# Patient Record
Sex: Female | Born: 1945 | Race: Black or African American | Hispanic: No | Marital: Married | Smoking: Former smoker
Health system: Southern US, Community
[De-identification: ages and names within clinical notes are randomized; demographics above are authoritative.]

## PROBLEM LIST (undated history)

## (undated) DIAGNOSIS — E119 Type 2 diabetes mellitus without complications: Secondary | ICD-10-CM

## (undated) DIAGNOSIS — R0789 Other chest pain: Secondary | ICD-10-CM

## (undated) DIAGNOSIS — J45909 Unspecified asthma, uncomplicated: Secondary | ICD-10-CM

## (undated) DIAGNOSIS — E7800 Pure hypercholesterolemia, unspecified: Secondary | ICD-10-CM

## (undated) DIAGNOSIS — R0602 Shortness of breath: Secondary | ICD-10-CM

## (undated) DIAGNOSIS — I10 Essential (primary) hypertension: Secondary | ICD-10-CM

## (undated) DIAGNOSIS — R000 Tachycardia, unspecified: Secondary | ICD-10-CM

## (undated) HISTORY — PX: ABDOMINAL HYSTERECTOMY: SHX81

---

## 1999-07-15 ENCOUNTER — Emergency Department (HOSPITAL_COMMUNITY): Admission: EM | Admit: 1999-07-15 | Discharge: 1999-07-15 | Payer: Self-pay | Admitting: Emergency Medicine

## 2006-07-29 ENCOUNTER — Encounter: Admission: RE | Admit: 2006-07-29 | Discharge: 2006-07-29 | Payer: Self-pay | Admitting: Internal Medicine

## 2007-05-26 ENCOUNTER — Encounter: Admission: RE | Admit: 2007-05-26 | Discharge: 2007-05-26 | Payer: Self-pay | Admitting: Internal Medicine

## 2007-07-04 ENCOUNTER — Ambulatory Visit: Payer: Self-pay | Admitting: *Deleted

## 2007-10-20 ENCOUNTER — Encounter: Admission: RE | Admit: 2007-10-20 | Discharge: 2007-10-20 | Payer: Self-pay | Admitting: Internal Medicine

## 2009-01-28 ENCOUNTER — Encounter: Admission: RE | Admit: 2009-01-28 | Discharge: 2009-01-28 | Payer: Self-pay | Admitting: Internal Medicine

## 2012-03-10 DIAGNOSIS — M109 Gout, unspecified: Secondary | ICD-10-CM | POA: Diagnosis not present

## 2012-04-18 DIAGNOSIS — E7800 Pure hypercholesterolemia, unspecified: Secondary | ICD-10-CM | POA: Diagnosis not present

## 2012-04-18 DIAGNOSIS — Z79899 Other long term (current) drug therapy: Secondary | ICD-10-CM | POA: Diagnosis not present

## 2012-04-18 DIAGNOSIS — IMO0001 Reserved for inherently not codable concepts without codable children: Secondary | ICD-10-CM | POA: Diagnosis not present

## 2012-04-18 DIAGNOSIS — M109 Gout, unspecified: Secondary | ICD-10-CM | POA: Diagnosis not present

## 2012-04-18 DIAGNOSIS — I10 Essential (primary) hypertension: Secondary | ICD-10-CM | POA: Diagnosis not present

## 2012-04-21 DIAGNOSIS — S058X9A Other injuries of unspecified eye and orbit, initial encounter: Secondary | ICD-10-CM | POA: Diagnosis not present

## 2012-06-28 DIAGNOSIS — IMO0001 Reserved for inherently not codable concepts without codable children: Secondary | ICD-10-CM | POA: Diagnosis not present

## 2012-06-28 DIAGNOSIS — E785 Hyperlipidemia, unspecified: Secondary | ICD-10-CM | POA: Diagnosis not present

## 2012-06-28 DIAGNOSIS — Z0000 Encounter for general adult medical examination without abnormal findings: Secondary | ICD-10-CM | POA: Diagnosis not present

## 2012-06-28 DIAGNOSIS — I10 Essential (primary) hypertension: Secondary | ICD-10-CM | POA: Diagnosis not present

## 2012-06-28 DIAGNOSIS — E559 Vitamin D deficiency, unspecified: Secondary | ICD-10-CM | POA: Diagnosis not present

## 2012-06-28 DIAGNOSIS — Z23 Encounter for immunization: Secondary | ICD-10-CM | POA: Diagnosis not present

## 2012-07-12 DIAGNOSIS — I10 Essential (primary) hypertension: Secondary | ICD-10-CM | POA: Diagnosis not present

## 2012-07-12 DIAGNOSIS — I739 Peripheral vascular disease, unspecified: Secondary | ICD-10-CM | POA: Diagnosis not present

## 2012-07-13 ENCOUNTER — Encounter (HOSPITAL_COMMUNITY): Payer: Self-pay | Admitting: Emergency Medicine

## 2012-07-13 ENCOUNTER — Emergency Department (HOSPITAL_COMMUNITY): Payer: Medicare Other

## 2012-07-13 ENCOUNTER — Emergency Department (HOSPITAL_COMMUNITY)
Admission: EM | Admit: 2012-07-13 | Discharge: 2012-07-13 | Disposition: A | Discharge status: Home / Self Care | Payer: Medicare Other | Attending: Emergency Medicine | Admitting: Emergency Medicine

## 2012-07-13 DIAGNOSIS — R05 Cough: Secondary | ICD-10-CM | POA: Insufficient documentation

## 2012-07-13 DIAGNOSIS — E1122 Type 2 diabetes mellitus with diabetic chronic kidney disease: Secondary | ICD-10-CM | POA: Diagnosis present

## 2012-07-13 DIAGNOSIS — Z79899 Other long term (current) drug therapy: Secondary | ICD-10-CM | POA: Insufficient documentation

## 2012-07-13 DIAGNOSIS — R079 Chest pain, unspecified: Secondary | ICD-10-CM | POA: Diagnosis not present

## 2012-07-13 DIAGNOSIS — N1831 Chronic kidney disease, stage 3a: Secondary | ICD-10-CM | POA: Diagnosis present

## 2012-07-13 DIAGNOSIS — R0789 Other chest pain: Secondary | ICD-10-CM | POA: Diagnosis not present

## 2012-07-13 DIAGNOSIS — R000 Tachycardia, unspecified: Secondary | ICD-10-CM | POA: Diagnosis present

## 2012-07-13 DIAGNOSIS — R002 Palpitations: Secondary | ICD-10-CM | POA: Insufficient documentation

## 2012-07-13 DIAGNOSIS — R059 Cough, unspecified: Secondary | ICD-10-CM | POA: Insufficient documentation

## 2012-07-13 DIAGNOSIS — I10 Essential (primary) hypertension: Secondary | ICD-10-CM | POA: Diagnosis present

## 2012-07-13 DIAGNOSIS — R0602 Shortness of breath: Secondary | ICD-10-CM | POA: Diagnosis not present

## 2012-07-13 DIAGNOSIS — E119 Type 2 diabetes mellitus without complications: Secondary | ICD-10-CM | POA: Diagnosis not present

## 2012-07-13 DIAGNOSIS — E7800 Pure hypercholesterolemia, unspecified: Secondary | ICD-10-CM | POA: Insufficient documentation

## 2012-07-13 DIAGNOSIS — Z7982 Long term (current) use of aspirin: Secondary | ICD-10-CM | POA: Diagnosis not present

## 2012-07-13 DIAGNOSIS — J45909 Unspecified asthma, uncomplicated: Secondary | ICD-10-CM | POA: Insufficient documentation

## 2012-07-13 DIAGNOSIS — E785 Hyperlipidemia, unspecified: Secondary | ICD-10-CM | POA: Diagnosis present

## 2012-07-13 HISTORY — DX: Unspecified asthma, uncomplicated: J45.909

## 2012-07-13 HISTORY — DX: Shortness of breath: R06.02

## 2012-07-13 HISTORY — DX: Other chest pain: R07.89

## 2012-07-13 HISTORY — DX: Tachycardia, unspecified: R00.0

## 2012-07-13 HISTORY — DX: Type 2 diabetes mellitus without complications: E11.9

## 2012-07-13 HISTORY — DX: Essential (primary) hypertension: I10

## 2012-07-13 HISTORY — DX: Pure hypercholesterolemia, unspecified: E78.00

## 2012-07-13 LAB — POCT I-STAT TROPONIN I: Troponin i, poc: 0.06 ng/mL (ref 0.00–0.08)

## 2012-07-13 LAB — PROTIME-INR: Prothrombin Time: 12.8 seconds (ref 11.6–15.2)

## 2012-07-13 LAB — HEPATIC FUNCTION PANEL
Albumin: 3.7 g/dL (ref 3.5–5.2)
Alkaline Phosphatase: 66 U/L (ref 39–117)
Indirect Bilirubin: 0.6 mg/dL (ref 0.3–0.9)
Total Protein: 6.6 g/dL (ref 6.0–8.3)

## 2012-07-13 LAB — CK TOTAL AND CKMB (NOT AT ARMC): Relative Index: INVALID (ref 0.0–2.5)

## 2012-07-13 LAB — POCT I-STAT, CHEM 8
HCT: 36 % (ref 36.0–46.0)
Hemoglobin: 12.2 g/dL (ref 12.0–15.0)
Potassium: 3.3 mEq/L — ABNORMAL LOW (ref 3.5–5.1)
Sodium: 144 mEq/L (ref 135–145)
TCO2: 24 mmol/L (ref 0–100)

## 2012-07-13 LAB — CBC WITH DIFFERENTIAL/PLATELET
Basophils Absolute: 0 10*3/uL (ref 0.0–0.1)
Basophils Relative: 1 % (ref 0–1)
Eosinophils Relative: 2 % (ref 0–5)
HCT: 37 % (ref 36.0–46.0)
Lymphocytes Relative: 27 % (ref 12–46)
MCHC: 33.2 g/dL (ref 30.0–36.0)
MCV: 87.1 fL (ref 78.0–100.0)
Monocytes Absolute: 0.3 10*3/uL (ref 0.1–1.0)
Neutro Abs: 3.6 10*3/uL (ref 1.7–7.7)
Platelets: 182 10*3/uL (ref 150–400)
RDW: 14.5 % (ref 11.5–15.5)
WBC: 5.5 10*3/uL (ref 4.0–10.5)

## 2012-07-13 MED ORDER — POTASSIUM CHLORIDE CRYS ER 20 MEQ PO TBCR
40.0000 meq | EXTENDED_RELEASE_TABLET | Freq: Once | ORAL | Status: AC
  Administered 2012-07-13: 40 meq via ORAL
  Filled 2012-07-13: qty 2

## 2012-07-13 MED ORDER — IOHEXOL 350 MG/ML SOLN
100.0000 mL | Freq: Once | INTRAVENOUS | Status: AC | PRN
  Administered 2012-07-13: 100 mL via INTRAVENOUS

## 2012-07-13 MED ORDER — ALPRAZOLAM 0.5 MG PO TABS
0.2500 mg | ORAL_TABLET | Freq: Once | ORAL | Status: DC
Start: 2012-07-13 — End: 2012-07-13
  Filled 2012-07-13: qty 1

## 2012-07-13 MED ORDER — GI COCKTAIL ~~LOC~~
30.0000 mL | Freq: Once | ORAL | Status: AC
  Administered 2012-07-13: 30 mL via ORAL
  Filled 2012-07-13: qty 30

## 2012-07-13 NOTE — ED Notes (Signed)
PT. REPORTS SOB AND MID CHEST DISCOMFORT WITH OCCASIONAL DRY COUGH ONSET THIS MORNING . STATES "  MY HANDS ARE SHAKING" , DENIES CHEST PAIN / NO NAUSEA OR DIAPHORESIS.

## 2012-07-13 NOTE — H&P (Signed)
Renee Bradley is an 66 y.o. female.    PCP:  Dr. Renae Gloss  Chief Complaint: SOB and chest pain  HPI: 77 YOF presents to ER today after having chest pressure this AM and SOB, no nausea, no diaphoresis.  She did complain of some heart fluttering.  Here in ER EKG with SR with Lt ant. Fascicular block, early transition.   No pain currently with my exam.  No SOB currently.  Troponin 0.06.    Pt had dopplers in Dr. Mathews Robinsons office yesterday, not sure if venous or arterial.   Here in ER D dimer elevated and pt underwent CT angio revealing no significant central pulmonary emboli are identified although the peripheral vessels are not well opacified and smaller peripheral emboli could be obscured.   Her K+ level is low at 3.3, replacing now.   Cardiac risk factors include:  Family history CAD, DM, HTN, hyperlipidemia.    Past Medical History  Diagnosis Date  . Asthma   . Hypertension   . Diabetes mellitus without complication   . Hypercholesterolemia   . Chest pressure 07/13/2012  . SOB (shortness of breath) 07/13/2012  . DM (diabetes mellitus) 07/13/2012  . HTN (hypertension) 07/13/2012  . Tachycardia, unspecified, "fluttering" 07/13/2012    History reviewed. No pertinent past surgical history.  Family History  Problem Relation Age of Onset  . Heart attack Mother   . Diabetes type II Mother   . Stroke Mother   . Alzheimer's disease Father   . Diabetes type II Brother    Social History:  reports that she has quit smoking. She does not have any smokeless tobacco history on file. She reports that she does not drink alcohol or use illicit drugs. Married, her husband is present with her.  Allergies:  Allergies  Allergen Reactions  . Sulfur Itching    OUTPATIENT MEDICATIONS: TekTurna 300 mg daily Aspirin 81 mg daily most days Atenolol 50 mg one twice a day Lipitor 20 mg daily Lasix 20 mg once daily  Saxagliptin-metformin 5-500 mg daily  Results for orders placed during the  hospital encounter of 07/13/12 (from the past 48 hour(s))  CBC WITH DIFFERENTIAL     Status: Normal   Collection Time   07/13/12  4:52 AM      Component Value Range Comment   WBC 5.5  4.0 - 10.5 K/uL    RBC 4.25  3.87 - 5.11 MIL/uL    Hemoglobin 12.3  12.0 - 15.0 g/dL    HCT 40.9  81.1 - 91.4 %    MCV 87.1  78.0 - 100.0 fL    MCH 28.9  26.0 - 34.0 pg    MCHC 33.2  30.0 - 36.0 g/dL    RDW 78.2  95.6 - 21.3 %    Platelets 182  150 - 400 K/uL    Neutrophils Relative 65  43 - 77 %    Neutro Abs 3.6  1.7 - 7.7 K/uL    Lymphocytes Relative 27  12 - 46 %    Lymphs Abs 1.5  0.7 - 4.0 K/uL    Monocytes Relative 6  3 - 12 %    Monocytes Absolute 0.3  0.1 - 1.0 K/uL    Eosinophils Relative 2  0 - 5 %    Eosinophils Absolute 0.1  0.0 - 0.7 K/uL    Basophils Relative 1  0 - 1 %    Basophils Absolute 0.0  0.0 - 0.1 K/uL   D-DIMER, QUANTITATIVE  Status: Abnormal   Collection Time   07/13/12  5:08 AM      Component Value Range Comment   D-Dimer, Quant 0.59 (*) 0.00 - 0.48 ug/mL-FEU   PROTIME-INR     Status: Normal   Collection Time   07/13/12  5:08 AM      Component Value Range Comment   Prothrombin Time 12.8  11.6 - 15.2 seconds    INR 0.97  0.00 - 1.49   POCT I-STAT TROPONIN I     Status: Normal   Collection Time   07/13/12  5:17 AM      Component Value Range Comment   Troponin i, poc 0.00  0.00 - 0.08 ng/mL    Comment 3            POCT I-STAT, CHEM 8     Status: Abnormal   Collection Time   07/13/12  5:19 AM      Component Value Range Comment   Sodium 144  135 - 145 mEq/L    Potassium 3.3 (*) 3.5 - 5.1 mEq/L    Chloride 108  96 - 112 mEq/L    BUN 12  6 - 23 mg/dL    Creatinine, Ser 4.09  0.50 - 1.10 mg/dL    Glucose, Bld 95  70 - 99 mg/dL    Calcium, Ion 8.11  9.14 - 1.30 mmol/L    TCO2 24  0 - 100 mmol/L    Hemoglobin 12.2  12.0 - 15.0 g/dL    HCT 78.2  95.6 - 21.3 %   POCT I-STAT TROPONIN I     Status: Normal   Collection Time   07/13/12  7:05 AM      Component Value  Range Comment   Troponin i, poc 0.06  0.00 - 0.08 ng/mL    Comment 3            HEPATIC FUNCTION PANEL     Status: Normal   Collection Time   07/13/12  9:29 AM      Component Value Range Comment   Total Protein 6.6  6.0 - 8.3 g/dL    Albumin 3.7  3.5 - 5.2 g/dL    AST 14  0 - 37 U/L    ALT 8  0 - 35 U/L    Alkaline Phosphatase 66  39 - 117 U/L    Total Bilirubin 0.7  0.3 - 1.2 mg/dL    Bilirubin, Direct 0.1  0.0 - 0.3 mg/dL    Indirect Bilirubin 0.6  0.3 - 0.9 mg/dL   MAGNESIUM     Status: Normal   Collection Time   07/13/12  9:29 AM      Component Value Range Comment   Magnesium 2.2  1.5 - 2.5 mg/dL   CK TOTAL AND CKMB     Status: Normal   Collection Time   07/13/12  9:34 AM      Component Value Range Comment   Total CK 83  7 - 177 U/L    CK, MB 2.6  0.3 - 4.0 ng/mL    Relative Index RELATIVE INDEX IS INVALID  0.0 - 2.5    Dg Chest 2 View  07/13/2012  *RADIOLOGY REPORT*  Clinical Data: Shortness of breath.  CHEST - 2 VIEW  Comparison: None.  Findings: Shallow inspiration.  Mild cardiac enlargement with normal pulmonary vascularity.  No focal airspace consolidation in the lungs.  No blunting of costophrenic angles.  No pneumothorax. Mediastinal contours appear intact.  Degenerative changes in the thoracic spine.  IMPRESSION: Cardiac enlargement.  No evidence of active pulmonary disease.   Original Report Authenticated By: Burman Nieves, M.D.    Ct Angio Chest Pe W/cm &/or Wo Cm  07/13/2012  *RADIOLOGY REPORT*  Clinical Data: Shortness of breath and chest discomfort. Occasional cough.  Elevated D-dimer.  CT ANGIOGRAPHY CHEST  Technique:  Multidetector CT imaging of the chest using the standard protocol during bolus administration of intravenous contrast. Multiplanar reconstructed images including MIPs were obtained and reviewed to evaluate the vascular anatomy.  Contrast: OMNIPAQUE IOHEXOL 350 MG/ML SOLN  Comparison: None.  Findings: There is moderately good opacification of  the central and proximal segmental pulmonary arteries.  More peripheral segmental branches are incompletely opacified.  No filling defects are demonstrated.  No evidence of significant central pulmonary embolus although peripheral emboli could be obscured. Mild cardiac enlargement.  Reflux of contrast material into the hepatic veins suggesting passive congestion.  Normal caliber thoracic aorta. Esophagus is decompressed.  No significant lymphadenopathy in the chest.  Visualization of the lung parenchyma is limited due to respiratory motion artifact but no focal consolidation is appreciated.  Mild dependent changes in the posterior lungs.  Airways appear patent. No pneumothorax.  No pleural effusions.  The degenerative changes in the thoracic spine.  IMPRESSION: No significant central pulmonary emboli are identified although the peripheral vessels are not well opacified and smaller peripheral emboli could be obscured.   Original Report Authenticated By: Burman Nieves, M.D.     ROS: General:no colds or fevers, no weight changes Skin:no rashes or ulcers HEENT:no blurred vision, no congestion CV:see HPI PUL:see HPI GI:no diarrhea constipation or melena, no indigestion GU:no hematuria, no dysuria MS:no joint pain, no claudication Neuro:no syncope, no lightheadedness Endo:+ diabetes, no thyroid disease   Blood pressure 114/57, pulse 55, temperature 99 F (37.2 C), temperature source Oral, resp. rate 14, SpO2 96.00%. PE: General: Alert oriented and female in no acute distress pleasant affect Skin: Warm and dry, brisk capillary refill HEENT: Normal cephalic, sclera clear, glasses in place Neck: Supple no JVD no carotid bruits Heart: S1-S2 regular rate and rhythm no murmur gallop rub or click Lungs: Clear without rales rhonchi or wheezes Abd: Positive bowel sounds soft nontender do not palpate liver spleen or masses Ext: No edema, 2+ pedal on the left questionable pedal on the right, positive  varicosities Neuro: Alert and oriented x3 follows commands moves all extremities    Assessment/Plan Principal Problem:  *Chest pressure Active Problems:  SOB (shortness of breath)  DM (diabetes mellitus)  HTN (hypertension)  Hyperlipidemia  Tachycardia, unspecified, "fluttering"  PLAN: Negative CT angio, though small vessel PE could not be addressed.  ? Admit to obs and do nuc study here?  She has multiple risk factors for CAD.  VS. D/c and do as outpt.   MD to see to evaluate. Second troponin is more elevated than 1st but still WNL.  CKMB Pend.    Addendum:  ckmb negative.  No further complaints -pt has been seen and evaluated by Dr. Allyson Sabal. We will arrange outpt Lexiscan myoview, echo and 30 day auto event monitor for bradycardia here in ER and "fluttering" at home.  INGOLD,LAURA R 07/13/2012, 11:55 AM    .Agree with note written by Nada Boozer RNP Runell Gess 07/15/2012 6:03 PM

## 2012-07-13 NOTE — ED Provider Notes (Signed)
History     CSN: [id number]  Arrival date & time 07/13/12  0409   First MD Initiated Contact with Patient 07/13/12 0422      Chief Complaint  Patient presents with  . Shortness of Breath    (Consider location/radiation/quality/duration/timing/severity/associated sxs/prior treatment) Patient is a 66 y.o. female presenting with chest pain. The history is provided by the patient. No language interpreter was used.  Chest Pain The chest pain began 3 - 5 hours ago. Chest pain occurs constantly. The chest pain is unchanged. The pain is associated with coughing. The severity of the pain is moderate. The quality of the pain is described as dull. The pain does not radiate. Exacerbated by: nothing. Primary symptoms include cough and palpitations. Pertinent negatives for primary symptoms include no fever, no fatigue, no syncope, no shortness of breath, no wheezing, no abdominal pain, no nausea and no vomiting.  The palpitations did not occur with shortness of breath.   Pertinent negatives for associated symptoms include no claudication and no lower extremity edema. She tried nothing for the symptoms. Risk factors include being elderly and female gender.  Pertinent negatives for past medical history include no MI.  Procedure history is negative for cardiac catheterization.   Awoke and had chest discomfort with a lot of belching and her hands were shaking and had sense she could not catch her breath.  No n/v/d.  Has had a dry cough for several days.  No weakness nor numbness. No changes in vision or speech.    Past Medical History  Diagnosis Date  . Asthma   . Hypertension   . Diabetes mellitus without complication   . Hypercholesterolemia     History reviewed. No pertinent past surgical history.  No family history on file.  History  Substance Use Topics  . Smoking status: Former Games developer  . Smokeless tobacco: Not on file  . Alcohol Use: No    OB History    Grav Para Term Preterm  Abortions TAB SAB Ect Mult Living                  Review of Systems  Constitutional: Negative for fever and fatigue.  Respiratory: Positive for cough. Negative for shortness of breath and wheezing.   Cardiovascular: Positive for chest pain and palpitations. Negative for claudication, leg swelling and syncope.  Gastrointestinal: Negative for nausea, vomiting and abdominal pain.  All other systems reviewed and are negative.    Allergies  Sulfur  Home Medications   Current Outpatient Rx  Name  Route  Sig  Dispense  Refill  . ALISKIREN FUMARATE 300 MG PO TABS   Oral   Take 300 mg by mouth daily.         . ASPIRIN 81 MG PO CHEW   Oral   Chew 81 mg by mouth daily.         . ATENOLOL 50 MG PO TABS   Oral   Take 100 mg by mouth daily.         . ATORVASTATIN CALCIUM 20 MG PO TABS   Oral   Take 20 mg by mouth daily.         . FUROSEMIDE 20 MG PO TABS   Oral   Take 20 mg by mouth 2 (two) times daily.         Marland Kitchen SAXAGLIPTIN-METFORMIN ER 5-500 MG PO TB24   Oral   Take 1 tablet by mouth daily.  BP 171/75  Pulse 70  Temp 99 F (37.2 C) (Oral)  Resp 14  SpO2 99%  Physical Exam  Constitutional: She is oriented to person, place, and time. She appears well-developed and well-nourished. No distress.  HENT:  Head: Normocephalic and atraumatic.  Mouth/Throat: Oropharynx is clear and moist.  Eyes: EOM are normal. Pupils are equal, round, and reactive to light.  Neck: Normal range of motion. Neck supple.  Cardiovascular: Normal rate, regular rhythm and intact distal pulses.   Pulmonary/Chest: Effort normal. She has no wheezes. She has no rales.  Abdominal: Soft. Bowel sounds are normal. There is no tenderness. There is no rebound and no guarding.  Musculoskeletal: Normal range of motion. She exhibits no edema and no tenderness.  Neurological: She is alert and oriented to person, place, and time. She has normal reflexes. She exhibits normal muscle tone.   Skin: Skin is warm and dry. She is not diaphoretic.  Psychiatric: She has a normal mood and affect.    ED Course  Procedures (including critical care time)   Labs Reviewed  CBC WITH DIFFERENTIAL  D-DIMER, QUANTITATIVE   No results found.   No diagnosis found.    MDM   Date: 07/13/2012  Rate: 65  Rhythm: normal sinus rhythm  QRS Axis: normal  Intervals: normal  ST/T Wave abnormalities: normal  Conduction Disutrbances: none  Narrative Interpretation:early transition   Pain not consistent with PE. Due to change in troponin will have Dr. Allyson Sabal of [facility] and his team evaluate the patient.        Jasmine Awe, MD 07/13/12 [phone]

## 2012-07-14 ENCOUNTER — Other Ambulatory Visit (HOSPITAL_COMMUNITY): Payer: Self-pay | Admitting: Cardiology

## 2012-07-14 DIAGNOSIS — R001 Bradycardia, unspecified: Secondary | ICD-10-CM

## 2012-07-14 DIAGNOSIS — I498 Other specified cardiac arrhythmias: Secondary | ICD-10-CM

## 2012-07-14 DIAGNOSIS — R079 Chest pain, unspecified: Secondary | ICD-10-CM

## 2012-07-29 ENCOUNTER — Ambulatory Visit (HOSPITAL_COMMUNITY): Payer: Medicare Other

## 2012-07-29 ENCOUNTER — Inpatient Hospital Stay (HOSPITAL_COMMUNITY): Admission: RE | Admit: 2012-07-29 | Payer: Medicare Other | Source: Ambulatory Visit

## 2012-07-29 DIAGNOSIS — R002 Palpitations: Secondary | ICD-10-CM | POA: Diagnosis not present

## 2012-08-10 ENCOUNTER — Ambulatory Visit (HOSPITAL_COMMUNITY)
Admission: RE | Admit: 2012-08-10 | Discharge: 2012-08-10 | Disposition: A | Discharge status: Home / Self Care | Payer: Medicare Other | Source: Ambulatory Visit | Attending: Cardiology | Admitting: Cardiology

## 2012-08-10 DIAGNOSIS — R079 Chest pain, unspecified: Secondary | ICD-10-CM

## 2012-08-10 DIAGNOSIS — R072 Precordial pain: Secondary | ICD-10-CM | POA: Insufficient documentation

## 2012-08-10 DIAGNOSIS — I10 Essential (primary) hypertension: Secondary | ICD-10-CM | POA: Insufficient documentation

## 2012-08-10 DIAGNOSIS — R0789 Other chest pain: Secondary | ICD-10-CM | POA: Diagnosis not present

## 2012-08-10 DIAGNOSIS — R001 Bradycardia, unspecified: Secondary | ICD-10-CM

## 2012-08-10 DIAGNOSIS — E119 Type 2 diabetes mellitus without complications: Secondary | ICD-10-CM | POA: Insufficient documentation

## 2012-08-10 DIAGNOSIS — R0602 Shortness of breath: Secondary | ICD-10-CM | POA: Diagnosis not present

## 2012-08-10 DIAGNOSIS — I495 Sick sinus syndrome: Secondary | ICD-10-CM | POA: Insufficient documentation

## 2012-08-10 DIAGNOSIS — I498 Other specified cardiac arrhythmias: Secondary | ICD-10-CM

## 2012-08-10 DIAGNOSIS — I4892 Unspecified atrial flutter: Secondary | ICD-10-CM | POA: Insufficient documentation

## 2012-08-10 DIAGNOSIS — I059 Rheumatic mitral valve disease, unspecified: Secondary | ICD-10-CM | POA: Insufficient documentation

## 2012-08-10 DIAGNOSIS — I369 Nonrheumatic tricuspid valve disorder, unspecified: Secondary | ICD-10-CM | POA: Diagnosis not present

## 2012-08-10 MED ORDER — REGADENOSON 0.4 MG/5ML IV SOLN
0.4000 mg | Freq: Once | INTRAVENOUS | Status: AC
  Administered 2012-08-10: 0.4 mg via INTRAVENOUS

## 2012-08-10 MED ORDER — TECHNETIUM TC 99M SESTAMIBI GENERIC - CARDIOLITE
30.5000 | Freq: Once | INTRAVENOUS | Status: AC | PRN
  Administered 2012-08-10: 31 via INTRAVENOUS

## 2012-08-10 MED ORDER — TECHNETIUM TC 99M SESTAMIBI GENERIC - CARDIOLITE
10.4000 | Freq: Once | INTRAVENOUS | Status: AC | PRN
  Administered 2012-08-10: 10 via INTRAVENOUS

## 2012-08-10 NOTE — Progress Notes (Signed)
[facility]   2D echo completed 08/10/2012.   Cindy Shelly Spenser, RDCS   

## 2012-08-10 NOTE — Procedures (Addendum)
[facility] [address] [place] [address] [place] [address] [phone]  Cardiology Nuclear Med Study  Renee Bradley is a 67 y.o. female     MRN : [id number]     DOB: 19-Oct-1945  Procedure Date: 08/10/2012  Nuclear Med Background Indication for Stress Test:  Evaluation for Ischemia History:  no prior cardiac history Cardiac Risk Factors: Family History - CAD, Hypertension, Lipids, NIDDM and Obesity  Symptoms:  SOB and chest pressure, flutter   Nuclear Pre-Procedure Caffeine/Decaff Intake:  7:00pm NPO After: 5:00am   IV Site: R Antecubital  IV 0.9% NS with Angio Cath:  22g  Chest Size (in):  n/a IV Started by: Koren Shiver, CNMT  Height: 5\' 3"  (1.6 m)  Cup Size: D  BMI:  Body mass index is 35.07 kg/(m^2). Weight:  198 lb (89.812 kg)   Tech Comments:  n/a    Nuclear Med Study 1 or 2 day study: 1 day  Stress Test Type:  Lexiscan  Order Authorizing Provider:  Nanetta Batty, MD   Resting Radionuclide: Technetium 63m Sestamibi  Resting Radionuclide Dose: 10.4 mCi   Stress Radionuclide:  Technetium 7m Sestamibi  Stress Radionuclide Dose: 30.5 mCi           Stress Protocol Rest HR: 44 Stress HR: 78  Rest BP:136/79 Stress BP: 157/71  Exercise Time (min): n/a METS: n/a          Dose of Adenosine (mg):  n/a Dose of Lexiscan: 0.4 mg  Dose of Atropine (mg): n/a Dose of Dobutamine: n/a mcg/kg/min (at max HR)  Stress Test Technologist: Ernestene Mention, CCT Nuclear Technologist: Gonzella Lex, CNMT   Rest Procedure:  Myocardial perfusion imaging was performed at rest 45 minutes following the intravenous administration of Technetium 23m Sestamibi. Stress Procedure:  The patient received IV Lexiscan 0.4 mg over 15-seconds.  Technetium 2m Sestamibi injected at 30-seconds.  There were no significant changes with Lexiscan.  Quantitative spect images were obtained after a 45 minute delay.  Transient Ischemic Dilatation (Normal <1.22):   0.91 Lung/Heart Ratio (Normal <0.45):  0.36 QGS EDV: 91 ml QGS ESV:  36 ml LV Ejection Fraction: 60%  Signed by       Rest ECG: NSR with non-specific ST-T wave changes  Stress ECG: No significant change from baseline ECG  QPS Raw Data Images:  Normal; no motion artifact; normal heart/lung ratio. Stress Images:  There is decreased uptake inferolaterally towards the apex Rest Images:  Normal homogeneous uptake in all areas of the myocardium. Subtraction (SDS):  These findings are consistent with ischemia.  Impression Exercise Capacity:  Lexiscan with no exercise. BP Response:  Normal blood pressure response. Clinical Symptoms:  No significant symptoms noted. ECG Impression:  No significant ST segment change suggestive of ischemia. Comparison with Prior Nuclear Study: No images to compare  Overall Impression:  Intermediate stress nuclear study.  LV Wall Motion:  Normal Wall Motion   Runell Gess, MD  08/10/2012 12:32 PM

## 2012-09-05 DIAGNOSIS — E782 Mixed hyperlipidemia: Secondary | ICD-10-CM | POA: Diagnosis not present

## 2012-09-05 DIAGNOSIS — E119 Type 2 diabetes mellitus without complications: Secondary | ICD-10-CM | POA: Diagnosis not present

## 2012-09-05 DIAGNOSIS — I10 Essential (primary) hypertension: Secondary | ICD-10-CM | POA: Diagnosis not present

## 2012-10-21 DIAGNOSIS — I10 Essential (primary) hypertension: Secondary | ICD-10-CM | POA: Diagnosis not present

## 2012-10-21 DIAGNOSIS — E559 Vitamin D deficiency, unspecified: Secondary | ICD-10-CM | POA: Diagnosis not present

## 2012-12-29 DIAGNOSIS — Z79899 Other long term (current) drug therapy: Secondary | ICD-10-CM | POA: Diagnosis not present

## 2012-12-29 DIAGNOSIS — E119 Type 2 diabetes mellitus without complications: Secondary | ICD-10-CM | POA: Diagnosis not present

## 2012-12-29 DIAGNOSIS — I10 Essential (primary) hypertension: Secondary | ICD-10-CM | POA: Diagnosis not present

## 2012-12-29 DIAGNOSIS — E785 Hyperlipidemia, unspecified: Secondary | ICD-10-CM | POA: Diagnosis not present

## 2013-01-05 DIAGNOSIS — Z1211 Encounter for screening for malignant neoplasm of colon: Secondary | ICD-10-CM | POA: Diagnosis not present

## 2013-01-05 DIAGNOSIS — Z8371 Family history of colonic polyps: Secondary | ICD-10-CM | POA: Diagnosis not present

## 2013-01-05 DIAGNOSIS — R142 Eructation: Secondary | ICD-10-CM | POA: Diagnosis not present

## 2013-01-05 DIAGNOSIS — R143 Flatulence: Secondary | ICD-10-CM | POA: Diagnosis not present

## 2013-02-06 DIAGNOSIS — M722 Plantar fascial fibromatosis: Secondary | ICD-10-CM | POA: Diagnosis not present

## 2013-03-20 DIAGNOSIS — M7660 Achilles tendinitis, unspecified leg: Secondary | ICD-10-CM | POA: Diagnosis not present

## 2013-03-20 DIAGNOSIS — M722 Plantar fascial fibromatosis: Secondary | ICD-10-CM | POA: Diagnosis not present

## 2013-03-28 ENCOUNTER — Encounter (HOSPITAL_COMMUNITY): Payer: Self-pay | Admitting: *Deleted

## 2013-03-28 ENCOUNTER — Emergency Department (INDEPENDENT_AMBULATORY_CARE_PROVIDER_SITE_OTHER)
Admission: EM | Admit: 2013-03-28 | Discharge: 2013-03-28 | Disposition: A | Discharge status: Home / Self Care | Payer: Medicare Other | Source: Home / Self Care

## 2013-03-28 DIAGNOSIS — K219 Gastro-esophageal reflux disease without esophagitis: Secondary | ICD-10-CM | POA: Diagnosis not present

## 2013-03-28 MED ORDER — OMEPRAZOLE 40 MG PO CPDR: 40.0000 mg | DELAYED_RELEASE_CAPSULE | Freq: Every day | ORAL | Status: DC

## 2013-03-28 MED ORDER — GI COCKTAIL ~~LOC~~
ORAL | Status: AC
  Filled 2013-03-28: qty 30

## 2013-03-28 MED ORDER — GI COCKTAIL ~~LOC~~
30.0000 mL | Freq: Once | ORAL | Status: AC
  Administered 2013-03-28: 30 mL via ORAL

## 2013-03-28 NOTE — ED Notes (Signed)
Pt  Reports  Symptoms  Of  Epigastric  Burning         Last    Night  And  Early  Pm            She  Vomited      And       She  Felt  Better       She  Reports   Some  Diarrhea  As  Well      At  This  Time  Pt   Is  Sitting  Upright on  Exam table  Speaking in  Complete  sentances  And  Is  In no  Distress

## 2013-03-28 NOTE — ED Provider Notes (Signed)
Renee Bradley is a 67 y.o. female who presents to Urgent Care today for abdominal pain, vomiting and diarrhea. These symptoms started last night. She noted a burning sensation in her throat. He developed a few episodes of vomiting and diarrhea with it. She denies any blood in her vomit or diarrhea. She ate a salad prior to the symptoms starting. She has not changed her medications. The symptoms have resolved by this morning with the exception of the heartburn. She denies any trouble breathing palpitations or chest pain.  No fevers or chills.    PMH reviewed. Diabetes and hypertension  History  Substance Use Topics  . Smoking status: Former Games developer  . Smokeless tobacco: Not on file  . Alcohol Use: No   ROS as above Medications reviewed. Current Facility-Administered Medications  Medication Dose Route Frequency Provider Last Rate Last Dose  . gi cocktail (Maalox,Lidocaine,Donnatal)  30 mL Oral Once Rodolph Bong, MD       Current Outpatient Prescriptions  Medication Sig Dispense Refill  . aliskiren (TEKTURNA) 300 MG tablet Take 300 mg by mouth daily.      Marland Kitchen aspirin 81 MG chewable tablet Chew 81 mg by mouth daily.      Marland Kitchen atenolol (TENORMIN) 50 MG tablet Take 100 mg by mouth daily.      Marland Kitchen atorvastatin (LIPITOR) 20 MG tablet Take 20 mg by mouth daily.      . furosemide (LASIX) 20 MG tablet Take 20 mg by mouth 2 (two) times daily.      . Saxagliptin-Metformin (KOMBIGLYZE XR) 5-500 MG TB24 Take 1 tablet by mouth daily.        Exam:  BP 160/79  Pulse 55  Temp(Src) 98.3 F (36.8 C) (Oral)  Resp 20  SpO2 98% Gen: Well NAD HEENT: EOMI,  MMM Lungs: CTABL Nl WOB Heart: RRR no MRG Abd: NABS, NT, ND Exts: Non edematous BL  LE, warm and well perfused.   Patient had significant improvement of her symptoms following administration of the GI cocktail  No results found for this or any previous visit (from the past 24 hour(s)). No results found.  Assessment and Plan: 67 y.o. female with GERD  versus gastritis. Patient had significant improvement with GI cocktail. Plan to prescribe omeprazole and have patient followup with her primary care provider in a few weeks. Handout provided Discussed warning signs or symptoms. Please see discharge instructions. Patient expresses understanding.      Rodolph Bong, MD 03/28/13 1146

## 2013-04-12 ENCOUNTER — Ambulatory Visit (INDEPENDENT_AMBULATORY_CARE_PROVIDER_SITE_OTHER): Payer: Medicare Other | Admitting: Cardiovascular Disease

## 2013-04-12 ENCOUNTER — Encounter: Payer: Self-pay | Admitting: Cardiovascular Disease

## 2013-04-12 VITALS — BP 112/82 | HR 46 | Ht 63.0 in | Wt 194.0 lb

## 2013-04-12 DIAGNOSIS — E785 Hyperlipidemia, unspecified: Secondary | ICD-10-CM

## 2013-04-12 DIAGNOSIS — I10 Essential (primary) hypertension: Secondary | ICD-10-CM | POA: Diagnosis not present

## 2013-04-12 NOTE — Assessment & Plan Note (Signed)
Under good control on current medications 

## 2013-04-12 NOTE — Progress Notes (Signed)
04/12/2013 Renee Bradley   Jun 30, 1946  [id number]  Primary Physician Alva Garnet., MD Primary Cardiologist: Runell Gess MD Renee Bradley   HPI:  The patient is a 67 year old moderately overweight married [place] American female, mother of 2, grandmother of 2 grandchildren, who is accompanied by her daughter today. She was recently hospitalized, July 15, 2012, with shortness of breath. She had had lower extremity Dopplers performed at Dr. Mathews Robinsons office just prior to that. CT angiogram was negative. She was ultimately discharged home. Her risk factors for ischemic heart disease include remote tobacco abuse, having quit 35 years ago, and treated diabetes, hypertension, and hyperlipidemia. There is no family history of heart disease. She has never had a heart attack or stroke. Since her admission, she denies chest pain or shortness of breath. Her surgical history is remarkable for a remote hysterectomy in 1989. She wore an event monitor, which showed sinus rhythm, and a Lexiscan Myoview showed mild inferolateral-apical ischemia.  Since I saw her 6 months ago she has been completely asymptomatic.     Current Outpatient Prescriptions  Medication Sig Dispense Refill  . aliskiren (TEKTURNA) 300 MG tablet Take 300 mg by mouth daily.      Marland Kitchen aspirin 81 MG chewable tablet Chew 81 mg by mouth daily.      Marland Kitchen atenolol (TENORMIN) 50 MG tablet Take 100 mg by mouth daily.      Marland Kitchen atorvastatin (LIPITOR) 20 MG tablet Take 20 mg by mouth daily.      . furosemide (LASIX) 20 MG tablet Take 20 mg by mouth daily.       . Saxagliptin-Metformin (KOMBIGLYZE XR) 5-500 MG TB24 Take 1 tablet by mouth daily.       No current facility-administered medications for this visit.    Allergies  Allergen Reactions  . Sulfur Itching    History   Social History  . Marital Status: Married    Spouse Name: N/A    Number of Children: N/A  . Years of Education: N/A   Occupational History  .  Not on file.   Social History Main Topics  . Smoking status: Former Games developer  . Smokeless tobacco: Not on file  . Alcohol Use: No  . Drug Use: No  . Sexual Activity:    Other Topics Concern  . Not on file   Social History Narrative  . No narrative on file     Review of Systems: General: negative for chills, fever, night sweats or weight changes.  Cardiovascular: negative for chest pain, dyspnea on exertion, edema, orthopnea, palpitations, paroxysmal nocturnal dyspnea or shortness of breath Dermatological: negative for rash Respiratory: negative for cough or wheezing Urologic: negative for hematuria Abdominal: negative for nausea, vomiting, diarrhea, bright red blood per rectum, melena, or hematemesis Neurologic: negative for visual changes, syncope, or dizziness All other systems reviewed and are otherwise negative except as noted above.    Blood pressure 112/82, pulse 46, height 5\' 3"  (1.6 m), weight 194 lb (87.998 kg).  General appearance: alert and no distress Neck: no adenopathy, no carotid bruit, no JVD, supple, symmetrical, trachea midline and thyroid not enlarged, symmetric, no tenderness/mass/nodules Lungs: clear to auscultation bilaterally Heart: regular rate and rhythm, S1, S2 normal, no murmur, click, rub or gallop Extremities: extremities normal, atraumatic, no cyanosis or edema  EKG sinus bradycardia at 46 with nonspecific ST and T-wave changes  ASSESSMENT AND PLAN:   HTN (hypertension) Under good control on current medications  Hyperlipidemia On statin therapy followed by  her PCP      Runell Gess MD FACP,FACC,FAHA, [facility] 04/12/2013 5:21 PM

## 2013-04-12 NOTE — Patient Instructions (Addendum)
Your physician wants you to follow-up in: 1 year with Dr Berry. You will receive a reminder letter in the mail two months in advance. If you don't receive a letter, please call our office to schedule the follow-up appointment.  

## 2013-04-12 NOTE — Assessment & Plan Note (Signed)
On statin therapy followed by her PCP 

## 2013-04-21 DIAGNOSIS — IMO0002 Reserved for concepts with insufficient information to code with codable children: Secondary | ICD-10-CM | POA: Diagnosis not present

## 2013-04-21 DIAGNOSIS — M545 Low back pain, unspecified: Secondary | ICD-10-CM | POA: Diagnosis not present

## 2013-04-21 DIAGNOSIS — M169 Osteoarthritis of hip, unspecified: Secondary | ICD-10-CM | POA: Diagnosis not present

## 2013-05-12 ENCOUNTER — Encounter: Payer: Self-pay | Admitting: Cardiovascular Disease

## 2013-05-12 ENCOUNTER — Encounter: Payer: Medicare Other | Admitting: Cardiovascular Disease

## 2013-05-12 VITALS — BP 130/70 | HR 64 | Ht 63.0 in | Wt 198.6 lb

## 2013-05-15 NOTE — Assessment & Plan Note (Signed)
Well-controlled on current medications 

## 2013-05-15 NOTE — Assessment & Plan Note (Signed)
On statin therapy followed by her PCP 

## 2013-05-16 DIAGNOSIS — M25569 Pain in unspecified knee: Secondary | ICD-10-CM | POA: Diagnosis not present

## 2013-07-03 ENCOUNTER — Encounter: Payer: Self-pay | Admitting: Cardiovascular Disease

## 2013-07-03 NOTE — Progress Notes (Signed)
This encounter was created in error - please disregard.

## 2013-07-11 DIAGNOSIS — E785 Hyperlipidemia, unspecified: Secondary | ICD-10-CM | POA: Diagnosis not present

## 2013-07-11 DIAGNOSIS — E119 Type 2 diabetes mellitus without complications: Secondary | ICD-10-CM | POA: Diagnosis not present

## 2013-07-11 DIAGNOSIS — Z23 Encounter for immunization: Secondary | ICD-10-CM | POA: Diagnosis not present

## 2013-07-11 DIAGNOSIS — Z79899 Other long term (current) drug therapy: Secondary | ICD-10-CM | POA: Diagnosis not present

## 2013-07-11 DIAGNOSIS — Z0000 Encounter for general adult medical examination without abnormal findings: Secondary | ICD-10-CM | POA: Diagnosis not present

## 2013-07-11 DIAGNOSIS — E559 Vitamin D deficiency, unspecified: Secondary | ICD-10-CM | POA: Diagnosis not present

## 2013-07-11 DIAGNOSIS — I10 Essential (primary) hypertension: Secondary | ICD-10-CM | POA: Diagnosis not present

## 2013-09-14 ENCOUNTER — Other Ambulatory Visit: Payer: Self-pay

## 2013-09-14 DIAGNOSIS — Z1231 Encounter for screening mammogram for malignant neoplasm of breast: Secondary | ICD-10-CM

## 2013-10-12 ENCOUNTER — Ambulatory Visit: Payer: Medicare Other

## 2013-11-02 DIAGNOSIS — E785 Hyperlipidemia, unspecified: Secondary | ICD-10-CM | POA: Diagnosis not present

## 2013-11-02 DIAGNOSIS — E119 Type 2 diabetes mellitus without complications: Secondary | ICD-10-CM | POA: Diagnosis not present

## 2013-11-02 DIAGNOSIS — I10 Essential (primary) hypertension: Secondary | ICD-10-CM | POA: Diagnosis not present

## 2013-11-02 DIAGNOSIS — I808 Phlebitis and thrombophlebitis of other sites: Secondary | ICD-10-CM | POA: Diagnosis not present

## 2013-12-01 ENCOUNTER — Telehealth: Payer: Self-pay | Admitting: Cardiovascular Disease

## 2013-12-01 NOTE — Telephone Encounter (Signed)
Have some questions about a huge vein on her left breast that extends all around , and is noticing that with some of her medications that it will get bigger and some medications it will be in a relaxed state . Also has one on her (L) thigh and is very concern about them. Getting ready to leave and would like a call one day next week jplease .Marland Kitchen   Thanks

## 2013-12-01 NOTE — Telephone Encounter (Signed)
Returned call.  Left message on "[facility]" voicemail that call returned and message received.  Will need an appt for evaluation.  Call call back today before 4pm or Monday between 8am and 4pm.

## 2013-12-05 ENCOUNTER — Telehealth: Payer: Self-pay | Admitting: Cardiovascular Disease

## 2013-12-05 NOTE — Telephone Encounter (Signed)
Returning your call .. Please call  ° °Thanks  °

## 2013-12-05 NOTE — Telephone Encounter (Signed)
Returned call and pt verified x 2.  Pt stated she noticed one of her medications aggravates a broken vein on her L breast.  Stated the medicine is metformin and she did see her PCP.  Stated she told her it was phlebitis.    Pt advised to f/u with PCP as she may need a referral to a [facility] for evaluation.  Pt stated she thought she could receive that care in our office and RN explained that would be be another type of specialist.  Pt verbalized understanding and agreed w/ plan.  Contact info given for [facility] [place].

## 2014-02-07 DIAGNOSIS — E1165 Type 2 diabetes mellitus with hyperglycemia: Secondary | ICD-10-CM | POA: Diagnosis not present

## 2014-02-07 DIAGNOSIS — N182 Chronic kidney disease, stage 2 (mild): Secondary | ICD-10-CM | POA: Diagnosis not present

## 2014-02-07 DIAGNOSIS — E1129 Type 2 diabetes mellitus with other diabetic kidney complication: Secondary | ICD-10-CM | POA: Diagnosis not present

## 2014-02-07 DIAGNOSIS — I129 Hypertensive chronic kidney disease with stage 1 through stage 4 chronic kidney disease, or unspecified chronic kidney disease: Secondary | ICD-10-CM | POA: Diagnosis not present

## 2014-02-07 DIAGNOSIS — N058 Unspecified nephritic syndrome with other morphologic changes: Secondary | ICD-10-CM | POA: Diagnosis not present

## 2014-02-27 ENCOUNTER — Encounter: Payer: Self-pay | Admitting: Cardiovascular Disease

## 2014-02-27 ENCOUNTER — Ambulatory Visit (INDEPENDENT_AMBULATORY_CARE_PROVIDER_SITE_OTHER): Payer: Medicare Other | Admitting: Cardiovascular Disease

## 2014-02-27 VITALS — BP 140/72 | HR 50 | Ht 63.0 in | Wt 197.4 lb

## 2014-02-27 DIAGNOSIS — E785 Hyperlipidemia, unspecified: Secondary | ICD-10-CM | POA: Diagnosis not present

## 2014-02-27 DIAGNOSIS — I10 Essential (primary) hypertension: Secondary | ICD-10-CM

## 2014-02-27 DIAGNOSIS — R000 Tachycardia, unspecified: Secondary | ICD-10-CM | POA: Diagnosis not present

## 2014-02-27 DIAGNOSIS — Z79899 Other long term (current) drug therapy: Secondary | ICD-10-CM

## 2014-02-27 DIAGNOSIS — E782 Mixed hyperlipidemia: Secondary | ICD-10-CM | POA: Diagnosis not present

## 2014-02-27 NOTE — Assessment & Plan Note (Signed)
Controlled on current medications 

## 2014-02-27 NOTE — Assessment & Plan Note (Signed)
On statin drugs which he takes sporadically. She admits to dietary indiscretion as well. We will check a lipid and liver profile.

## 2014-02-27 NOTE — Patient Instructions (Signed)
Your physician recommends that you return for lab work FASTING  Your physician recommends that you schedule a follow-up appointment in: SIX MONTHS with an extender and ONE YEAR with Dr.Berry

## 2014-02-27 NOTE — Progress Notes (Signed)
02/27/2014 Renee Bradley   02-Nov-1945  [id number]  Primary Physician Gwynneth AlimentSANDERS,ROBYN N, MD Primary Cardiologist: Runell GessJonathan J. Aristotle Lieb MD Roseanne RenoFACP,FACC,FAHA, FSCAI   HPI:  The patient is a 68 year old moderately overweight married [place] American female, mother of 2, grandmother of 2 grandchildren, who is accompanied by her daughter today. She was hospitalized, July 15, 2012, with shortness of breath. She had had lower extremity Dopplers performed at Dr. Mathews RobinsonsShelton's office just prior to that. CT angiogram was negative. She was ultimately discharged home. Her risk factors for ischemic heart disease include remote tobacco abuse, having quit 35 years ago, and treated diabetes, hypertension, and hyperlipidemia. There is no family history of heart disease. She has never had a heart attack or stroke. Since her admission, she denies chest pain or shortness of breath. Her surgical history is remarkable for a remote hysterectomy in 1989. She wore an event monitor, which showed sinus rhythm, and a Lexiscan Myoview showed mild inferolateral-apical ischemia.  [place]she has been completely asymptomatic except for occasional episodes of congestion which she attributes to humid air.    Current Outpatient Prescriptions  Medication Sig Dispense Refill  . aliskiren (TEKTURNA) 300 MG tablet Take 300 mg by mouth daily.      Marland Kitchen. aspirin 81 MG chewable tablet Chew 81 mg by mouth daily.      Marland Kitchen. atenolol (TENORMIN) 50 MG tablet Take 100 mg by mouth daily.      Marland Kitchen. atorvastatin (LIPITOR) 20 MG tablet Take 20 mg by mouth daily.      . calcium carbonate (OS-CAL) 600 MG TABS tablet Take 600 mg by mouth daily with breakfast.      . cholecalciferol (VITAMIN D) 1000 UNITS tablet Take 1,000 Units by mouth daily.      . colchicine 0.6 MG tablet Take 0.6 mg by mouth as needed.      . furosemide (LASIX) 20 MG tablet Take 20 mg by mouth daily.       . meloxicam (MOBIC) 7.5 MG tablet Take 1 tablet by mouth as  needed.      Marland Kitchen. omeprazole (PRILOSEC) 40 MG capsule Take 1 capsule by mouth as needed.      . Saxagliptin-Metformin (KOMBIGLYZE XR) 5-500 MG TB24 Take 1 tablet by mouth daily.      . traMADol (ULTRAM) 50 MG tablet Take 1 tablet by mouth as needed.       No current facility-administered medications for this visit.    Allergies  Allergen Reactions  . Sulfur Itching    History   Social History  . Marital Status: Married    Spouse Name: N/A    Number of Children: N/A  . Years of Education: N/A   Occupational History  . Not on file.   Social History Main Topics  . Smoking status: Former Smoker    Quit date: 05/13/1983  . Smokeless tobacco: Never Used  . Alcohol Use: No  . Drug Use: No  . Sexual Activity: Not on file   Other Topics Concern  . Not on file   Social History Narrative  . No narrative on file     Review of Systems: General: negative for chills, fever, night sweats or weight changes.  Cardiovascular: negative for chest pain, dyspnea on exertion, edema, orthopnea, palpitations, paroxysmal nocturnal dyspnea or shortness of breath Dermatological: negative for rash Respiratory: negative for cough or wheezing Urologic: negative for hematuria Abdominal: negative for nausea, vomiting, diarrhea, bright red blood per rectum,  melena, or hematemesis Neurologic: negative for visual changes, syncope, or dizziness All other systems reviewed and are otherwise negative except as noted above.    Blood pressure 140/72, pulse 50, height 5\' 3"  (1.6 m), weight 197 lb 6.4 oz (89.54 kg).  General appearance: alert and no distress Neck: no adenopathy, no carotid bruit, no JVD, supple, symmetrical, trachea midline and thyroid not enlarged, symmetric, no tenderness/mass/nodules Lungs: clear to auscultation bilaterally Heart: regular rate and rhythm, S1, S2 normal, no murmur, click, rub or gallop Extremities: extremities normal, atraumatic, no cyanosis or edema  EKG sinus  bradycardia at 50 with anterior T wave inversion unchanged from prior EKGs  ASSESSMENT AND PLAN:   HTN (hypertension) Controlled on current medications  Hyperlipidemia On statin drugs which he takes sporadically. She admits to dietary indiscretion as well. We will check a lipid and liver profile.      Runell Gess MD FACP,FACC,FAHA, [facility] 02/27/2014 1:53 PM

## 2014-02-28 LAB — HEPATIC FUNCTION PANEL
ALK PHOS: 66 U/L (ref 39–117)
ALT: 11 U/L (ref 0–35)
AST: 16 U/L (ref 0–37)
Albumin: 4.2 g/dL (ref 3.5–5.2)
BILIRUBIN INDIRECT: 1 mg/dL (ref 0.2–1.2)
Bilirubin, Direct: 0.2 mg/dL (ref 0.0–0.3)
TOTAL PROTEIN: 6.4 g/dL (ref 6.0–8.3)
Total Bilirubin: 1.2 mg/dL (ref 0.2–1.2)

## 2014-02-28 LAB — LIPID PANEL
CHOLESTEROL: 163 mg/dL (ref 0–200)
HDL: 54 mg/dL (ref >39)
LDL Cholesterol: 88 mg/dL (ref 0–99)
TRIGLYCERIDES: 103 mg/dL (ref <150)
Total CHOL/HDL Ratio: 3 Ratio
VLDL: 21 mg/dL (ref 0–40)

## 2014-03-02 ENCOUNTER — Telehealth: Payer: Self-pay | Admitting: *Deleted

## 2014-03-02 NOTE — Telephone Encounter (Signed)
Left message letting patient know all blood work came back fine.

## 2014-06-23 DIAGNOSIS — I129 Hypertensive chronic kidney disease with stage 1 through stage 4 chronic kidney disease, or unspecified chronic kidney disease: Secondary | ICD-10-CM | POA: Diagnosis not present

## 2014-06-23 DIAGNOSIS — N182 Chronic kidney disease, stage 2 (mild): Secondary | ICD-10-CM | POA: Diagnosis not present

## 2014-06-23 DIAGNOSIS — N644 Mastodynia: Secondary | ICD-10-CM | POA: Diagnosis not present

## 2014-06-23 DIAGNOSIS — E1121 Type 2 diabetes mellitus with diabetic nephropathy: Secondary | ICD-10-CM | POA: Diagnosis not present

## 2014-07-16 ENCOUNTER — Other Ambulatory Visit: Payer: Self-pay | Admitting: Internal Medicine

## 2014-07-16 DIAGNOSIS — N644 Mastodynia: Secondary | ICD-10-CM

## 2014-07-19 DIAGNOSIS — Z13820 Encounter for screening for osteoporosis: Secondary | ICD-10-CM | POA: Diagnosis not present

## 2014-07-19 DIAGNOSIS — E1165 Type 2 diabetes mellitus with hyperglycemia: Secondary | ICD-10-CM | POA: Diagnosis not present

## 2014-07-19 DIAGNOSIS — E785 Hyperlipidemia, unspecified: Secondary | ICD-10-CM | POA: Diagnosis not present

## 2014-07-31 ENCOUNTER — Ambulatory Visit
Admission: RE | Admit: 2014-07-31 | Discharge: 2014-07-31 | Disposition: A | Discharge status: Home / Self Care | Payer: Medicare Other | Source: Ambulatory Visit | Attending: Internal Medicine | Admitting: Internal Medicine

## 2014-07-31 DIAGNOSIS — N644 Mastodynia: Secondary | ICD-10-CM

## 2014-07-31 DIAGNOSIS — R928 Other abnormal and inconclusive findings on diagnostic imaging of breast: Secondary | ICD-10-CM | POA: Diagnosis not present

## 2014-08-15 ENCOUNTER — Encounter (HOSPITAL_COMMUNITY): Payer: Self-pay | Admitting: Emergency Medicine

## 2014-08-15 ENCOUNTER — Emergency Department (HOSPITAL_COMMUNITY)
Admission: EM | Admit: 2014-08-15 | Discharge: 2014-08-16 | Disposition: A | Discharge status: Home / Self Care | Payer: Medicare Other | Attending: Emergency Medicine | Admitting: Emergency Medicine

## 2014-08-15 DIAGNOSIS — J45901 Unspecified asthma with (acute) exacerbation: Secondary | ICD-10-CM | POA: Diagnosis not present

## 2014-08-15 DIAGNOSIS — E119 Type 2 diabetes mellitus without complications: Secondary | ICD-10-CM | POA: Insufficient documentation

## 2014-08-15 DIAGNOSIS — R002 Palpitations: Secondary | ICD-10-CM | POA: Diagnosis not present

## 2014-08-15 DIAGNOSIS — R42 Dizziness and giddiness: Secondary | ICD-10-CM | POA: Insufficient documentation

## 2014-08-15 DIAGNOSIS — Z87891 Personal history of nicotine dependence: Secondary | ICD-10-CM | POA: Diagnosis not present

## 2014-08-15 DIAGNOSIS — R0602 Shortness of breath: Secondary | ICD-10-CM

## 2014-08-15 DIAGNOSIS — Z7982 Long term (current) use of aspirin: Secondary | ICD-10-CM | POA: Diagnosis not present

## 2014-08-15 DIAGNOSIS — E780 Pure hypercholesterolemia: Secondary | ICD-10-CM | POA: Insufficient documentation

## 2014-08-15 DIAGNOSIS — I10 Essential (primary) hypertension: Secondary | ICD-10-CM | POA: Insufficient documentation

## 2014-08-15 DIAGNOSIS — Z79899 Other long term (current) drug therapy: Secondary | ICD-10-CM | POA: Insufficient documentation

## 2014-08-15 DIAGNOSIS — R001 Bradycardia, unspecified: Secondary | ICD-10-CM | POA: Diagnosis not present

## 2014-08-15 NOTE — ED Notes (Signed)
Per EMS pt states she is feeling short of breath that started 3 hours ago  Pt states she thinks it is from one of her medications that she has been taking for three years  Pt has hx of panic attacks last one was in 2014

## 2014-08-16 ENCOUNTER — Emergency Department (HOSPITAL_COMMUNITY): Payer: Medicare Other

## 2014-08-16 DIAGNOSIS — J45901 Unspecified asthma with (acute) exacerbation: Secondary | ICD-10-CM | POA: Diagnosis not present

## 2014-08-16 DIAGNOSIS — R0602 Shortness of breath: Secondary | ICD-10-CM | POA: Diagnosis not present

## 2014-08-16 LAB — BASIC METABOLIC PANEL
ANION GAP: 8 (ref 5–15)
BUN: 15 mg/dL (ref 6–23)
CHLORIDE: 107 meq/L (ref 96–112)
CO2: 25 mmol/L (ref 19–32)
CREATININE: 0.77 mg/dL (ref 0.50–1.10)
Calcium: 9.5 mg/dL (ref 8.4–10.5)
GFR calc Af Amer: >90 mL/min (ref >90)
GFR calc non Af Amer: 85 mL/min — ABNORMAL LOW (ref >90)
GLUCOSE: 129 mg/dL — AB (ref 70–99)
Potassium: 3.8 mmol/L (ref 3.5–5.1)
Sodium: 140 mmol/L (ref 135–145)

## 2014-08-16 LAB — CBC WITH DIFFERENTIAL/PLATELET
BASOS PCT: 0 % (ref 0–1)
Basophils Absolute: 0 10*3/uL (ref 0.0–0.1)
EOS PCT: 0 % (ref 0–5)
Eosinophils Absolute: 0 10*3/uL (ref 0.0–0.7)
HCT: 41 % (ref 36.0–46.0)
Hemoglobin: 13.6 g/dL (ref 12.0–15.0)
Lymphocytes Relative: 23 % (ref 12–46)
Lymphs Abs: 1.3 10*3/uL (ref 0.7–4.0)
MCH: 29.2 pg (ref 26.0–34.0)
MCHC: 33.2 g/dL (ref 30.0–36.0)
MCV: 88 fL (ref 78.0–100.0)
MONOS PCT: 4 % (ref 3–12)
Monocytes Absolute: 0.2 10*3/uL (ref 0.1–1.0)
NEUTROS ABS: 4.1 10*3/uL (ref 1.7–7.7)
NEUTROS PCT: 73 % (ref 43–77)
Platelets: 217 10*3/uL (ref 150–400)
RBC: 4.66 MIL/uL (ref 3.87–5.11)
RDW: 14.6 % (ref 11.5–15.5)
WBC: 5.6 10*3/uL (ref 4.0–10.5)

## 2014-08-16 LAB — I-STAT TROPONIN, ED: Troponin i, poc: 0.05 ng/mL (ref 0.00–0.08)

## 2014-08-16 LAB — BRAIN NATRIURETIC PEPTIDE: B NATRIURETIC PEPTIDE 5: 104.2 pg/mL — AB (ref 0.0–100.0)

## 2014-08-16 NOTE — ED Notes (Signed)
Pt ambulated with pulsox, O2 saturation stayed between 95-98%

## 2014-08-16 NOTE — ED Provider Notes (Signed)
CSN: [id number]     Arrival date & time 08/15/14  2306 History   First MD Initiated Contact with Patient 08/16/14 [phone]     Chief Complaint  Patient presents with  . Shortness of Breath    (Consider location/radiation/quality/duration/timing/severity/associated sxs/prior Treatment) HPI Comments: Patient is a 69 year old female with a history of hypertension, diabetes mellitus, hyperlipidemia, and shortness of breath. She presents to the emergency department today for further evaluation of palpitations. Patient states that she had 2 episodes over the past 2 days where she felt as though she could not get air through her nose. She states that her heart was pounding at this time and she felt shaky and lightheaded. Symptoms lasted for approximately 1 minute before spontaneously resolving. Patient states that she has had similar symptoms since December 2014. She reports that symptoms occur every 2-3 months. She believes that her symptoms are associated with her diabetes medication. Patient denies any symptoms at present. Patient denies associated fever, chest pain, syncope, nausea, vomiting, difficulty swallowing, drooling, lip swelling, tongue swelling, or extremity numbness/weakness. Patient states that she has talked with her primary care provider about these symptoms in the past. Her primary care doctor has further evaluated her symptoms with a negative workup.  The history is provided by the patient. No language interpreter was used.    Past Medical History  Diagnosis Date  . Asthma   . Hypertension   . Diabetes mellitus without complication   . Hypercholesterolemia   . Chest pressure 07/13/2012  . SOB (shortness of breath) 07/13/2012  . DM (diabetes mellitus) 07/13/2012  . HTN (hypertension) 07/13/2012  . Tachycardia, unspecified, "fluttering" 07/13/2012   Past Surgical History  Procedure Laterality Date  . Abdominal hysterectomy     Family History  Problem Relation Age of Onset  .  Heart attack Mother   . Diabetes type II Mother   . Stroke Mother   . Alzheimer's disease Father   . Diabetes type II Brother    History  Substance Use Topics  . Smoking status: Former Smoker    Quit date: 05/13/1983  . Smokeless tobacco: Never Used  . Alcohol Use: No   OB History    No data available      Review of Systems  Constitutional: Negative for fever.  Respiratory: Positive for shortness of breath.   Cardiovascular: Positive for palpitations. Negative for chest pain.  Gastrointestinal: Negative for vomiting and diarrhea.  Neurological: Positive for light-headedness. Negative for syncope.  All other systems reviewed and are negative.   Allergies  Sulfur  Home Medications   Prior to Admission medications   Medication Sig Start Date End Date Taking? Authorizing Provider  aliskiren (TEKTURNA) 300 MG tablet Take 300 mg by mouth daily.   Yes Historical Provider, MD  aspirin 81 MG chewable tablet Chew 81 mg by mouth daily.   Yes Historical Provider, MD  atenolol (TENORMIN) 50 MG tablet Take 100 mg by mouth daily.   Yes Historical Provider, MD  atorvastatin (LIPITOR) 20 MG tablet Take 20 mg by mouth daily.   Yes Historical Provider, MD  calcium carbonate (OS-CAL) 600 MG TABS tablet Take 600 mg by mouth daily with breakfast.   Yes Historical Provider, MD  cholecalciferol (VITAMIN D) 1000 UNITS tablet Take 1,000 Units by mouth daily.   Yes Historical Provider, MD  colchicine 0.6 MG tablet Take 0.6 mg by mouth as needed (gout).    Yes Historical Provider, MD  furosemide (LASIX) 20 MG tablet Take 20 mg by  mouth daily.    Yes Historical Provider, MD  Saxagliptin-Metformin (KOMBIGLYZE XR) 5-500 MG TB24 Take 1 tablet by mouth daily.   Yes Historical Provider, MD   BP 162/67 mmHg  Pulse 56  Temp(Src) 97.7 F (36.5 C) (Oral)  Resp 18  SpO2 95%   Physical Exam  Constitutional: She is oriented to person, place, and time. She appears well-developed and well-nourished. No  distress.  Nontoxic/nonseptic appearing  HENT:  Head: Normocephalic and atraumatic.  Eyes: Conjunctivae and EOM are normal. Pupils are equal, round, and reactive to light. No scleral icterus.  Neck: Normal range of motion.  No JVD  Cardiovascular: Regular rhythm and intact distal pulses.  Bradycardia present.   Pulmonary/Chest: Effort normal and breath sounds normal. No respiratory distress. She has no wheezes. She has no rales.  Respirations even and unlabored  Musculoskeletal: Normal range of motion.  Neurological: She is alert and oriented to person, place, and time. She exhibits normal muscle tone. Coordination normal.  GCS 15. Speech is goal oriented. Patient moves extremities without ataxia. She ambulates with normal gait.  Skin: Skin is warm and dry. No rash noted. She is not diaphoretic. No erythema. No pallor.  Psychiatric: She has a normal mood and affect. Her behavior is normal.  Nursing note and vitals reviewed.   ED Course  Procedures (including critical care time) Labs Review Labs Reviewed  BASIC METABOLIC PANEL - Abnormal; Notable for the following:    Glucose, Bld 129 (*)    GFR calc non Af Amer 85 (*)    All other components within normal limits  BRAIN NATRIURETIC PEPTIDE - Abnormal; Notable for the following:    B Natriuretic Peptide 104.2 (*)    All other components within normal limits  CBC WITH DIFFERENTIAL  Rosezena Sensor, ED    Imaging Review Dg Chest 2 View  08/16/2014   CLINICAL DATA:  Shortness of breath for several days. Initial encounter.  EXAM: CHEST  2 VIEW  COMPARISON:  Chest radiograph and CTA of the chest performed 07/13/2012  FINDINGS: The lungs are well-aerated and clear. There is no evidence of focal opacification, pleural effusion or pneumothorax.  The heart is borderline normal in size; the mediastinal contour is within normal limits. No acute osseous abnormalities are seen.  IMPRESSION: No acute cardiopulmonary process seen.   Electronically  Signed   By: Roanna Raider M.D.   On: 08/16/2014 02:42      EKG Interpretation   Date/Time:  Thursday August 16 2014 02:00:16 EST Ventricular Rate:  49 PR Interval:  121 QRS Duration: 105 QT Interval:  501 QTC Calculation: 452 R Axis:   -49 Text Interpretation:  Sinus bradycardia Ventricular premature complex Left  anterior fascicular block Abnormal R-wave progression, late transition  Minimal ST depression, lateral leads Baseline wander in lead(s) III aVL  agree. no acute change c/w old Confirmed by Donnald Garre, MD, Lebron Conners [id number]) on  08/16/2014 2:56:22 AM      MDM   Final diagnoses:  Shortness of breath  Palpitations    69 year old female presents to the emergency department for further evaluation of shortness of breath and palpitations. Patient has been experiencing similar symptoms sporadically since December 2014. She denies any change in her symptoms today compared to prior episodes. Patient has had a negative work up of these symptoms in the past, referenced in her most recent Cardiology progress note, including a negative echo, negative lower extremity dopplers, and negative CT angiogram.  Patient has been asymptomatic since arrival in  the ED. She has no evidence of ACS on her workup today. She ambulates in the ED without hypoxia. Doubt pulmonary embolism as well as aortic dissection. Do not believe there is any indication for further emergent workup at this time. Patient is stable and appropriate for discharge with instruction to follow-up with her primary care provider to further discuss her symptoms. As patient believes that her symptoms are associated with her diabetes medication started 2 years ago, have recommended that she talk to her doctor about potentially changing this regimen. Return precautions discussed and provided. Patient agreeable to plan with no unaddressed concerns. Patient discharged in good condition; VSS.   Filed Vitals:   08/15/14 2306 08/15/14 2351  08/16/14 0301  BP:  162/67 138/75  Pulse:  56 47  Temp:  97.7 F (36.5 C)   TempSrc:  Oral   Resp:  18 12  SpO2: 99% 95% 98%     Antony Madura, PA-C 08/20/14 0630  Arby Barrette, MD 08/21/14 2218

## 2014-08-16 NOTE — Discharge Instructions (Signed)

## 2014-08-22 DIAGNOSIS — E119 Type 2 diabetes mellitus without complications: Secondary | ICD-10-CM | POA: Diagnosis not present

## 2014-08-22 DIAGNOSIS — I10 Essential (primary) hypertension: Secondary | ICD-10-CM | POA: Diagnosis not present

## 2014-08-22 DIAGNOSIS — R002 Palpitations: Secondary | ICD-10-CM | POA: Diagnosis not present

## 2014-09-10 DIAGNOSIS — Z6833 Body mass index (BMI) 33.0-33.9, adult: Secondary | ICD-10-CM | POA: Diagnosis not present

## 2014-09-10 DIAGNOSIS — E1165 Type 2 diabetes mellitus with hyperglycemia: Secondary | ICD-10-CM | POA: Diagnosis not present

## 2014-09-10 DIAGNOSIS — E668 Other obesity: Secondary | ICD-10-CM | POA: Diagnosis not present

## 2014-09-10 DIAGNOSIS — Z79899 Other long term (current) drug therapy: Secondary | ICD-10-CM | POA: Diagnosis not present

## 2014-09-18 DIAGNOSIS — N08 Glomerular disorders in diseases classified elsewhere: Secondary | ICD-10-CM | POA: Diagnosis not present

## 2014-09-18 DIAGNOSIS — N182 Chronic kidney disease, stage 2 (mild): Secondary | ICD-10-CM | POA: Diagnosis not present

## 2014-09-18 DIAGNOSIS — D519 Vitamin B12 deficiency anemia, unspecified: Secondary | ICD-10-CM | POA: Diagnosis not present

## 2014-09-18 DIAGNOSIS — I129 Hypertensive chronic kidney disease with stage 1 through stage 4 chronic kidney disease, or unspecified chronic kidney disease: Secondary | ICD-10-CM | POA: Diagnosis not present

## 2014-09-18 DIAGNOSIS — Z0000 Encounter for general adult medical examination without abnormal findings: Secondary | ICD-10-CM | POA: Diagnosis not present

## 2014-09-18 DIAGNOSIS — E1122 Type 2 diabetes mellitus with diabetic chronic kidney disease: Secondary | ICD-10-CM | POA: Diagnosis not present

## 2014-09-18 DIAGNOSIS — E559 Vitamin D deficiency, unspecified: Secondary | ICD-10-CM | POA: Diagnosis not present

## 2014-09-29 ENCOUNTER — Emergency Department (HOSPITAL_COMMUNITY): Payer: Medicare Other

## 2014-09-29 ENCOUNTER — Encounter (HOSPITAL_COMMUNITY): Payer: Self-pay | Admitting: Emergency Medicine

## 2014-09-29 ENCOUNTER — Emergency Department (HOSPITAL_COMMUNITY)
Admission: EM | Admit: 2014-09-29 | Discharge: 2014-09-29 | Disposition: A | Discharge status: Home / Self Care | Payer: Medicare Other | Attending: Emergency Medicine | Admitting: Emergency Medicine

## 2014-09-29 DIAGNOSIS — J45901 Unspecified asthma with (acute) exacerbation: Secondary | ICD-10-CM | POA: Diagnosis not present

## 2014-09-29 DIAGNOSIS — R0981 Nasal congestion: Secondary | ICD-10-CM | POA: Insufficient documentation

## 2014-09-29 DIAGNOSIS — I10 Essential (primary) hypertension: Secondary | ICD-10-CM | POA: Insufficient documentation

## 2014-09-29 DIAGNOSIS — R0602 Shortness of breath: Secondary | ICD-10-CM | POA: Diagnosis not present

## 2014-09-29 DIAGNOSIS — E785 Hyperlipidemia, unspecified: Secondary | ICD-10-CM | POA: Diagnosis not present

## 2014-09-29 DIAGNOSIS — Z79899 Other long term (current) drug therapy: Secondary | ICD-10-CM | POA: Insufficient documentation

## 2014-09-29 DIAGNOSIS — Z87891 Personal history of nicotine dependence: Secondary | ICD-10-CM | POA: Insufficient documentation

## 2014-09-29 DIAGNOSIS — Z7982 Long term (current) use of aspirin: Secondary | ICD-10-CM | POA: Diagnosis not present

## 2014-09-29 DIAGNOSIS — E119 Type 2 diabetes mellitus without complications: Secondary | ICD-10-CM | POA: Diagnosis not present

## 2014-09-29 DIAGNOSIS — I517 Cardiomegaly: Secondary | ICD-10-CM | POA: Diagnosis not present

## 2014-09-29 LAB — BASIC METABOLIC PANEL
Anion gap: 8 (ref 5–15)
BUN: 8 mg/dL (ref 6–23)
CHLORIDE: 105 mmol/L (ref 96–112)
CO2: 22 mmol/L (ref 19–32)
CREATININE: 0.89 mg/dL (ref 0.50–1.10)
Calcium: 9.2 mg/dL (ref 8.4–10.5)
GFR, EST AFRICAN AMERICAN: 76 mL/min — AB (ref >90)
GFR, EST NON AFRICAN AMERICAN: 66 mL/min — AB (ref >90)
Glucose, Bld: 116 mg/dL — ABNORMAL HIGH (ref 70–99)
Potassium: 3.7 mmol/L (ref 3.5–5.1)
SODIUM: 135 mmol/L (ref 135–145)

## 2014-09-29 LAB — I-STAT TROPONIN, ED: Troponin i, poc: 0 ng/mL (ref 0.00–0.08)

## 2014-09-29 LAB — CBC
HCT: 40.6 % (ref 36.0–46.0)
Hemoglobin: 13.3 g/dL (ref 12.0–15.0)
MCH: 28.9 pg (ref 26.0–34.0)
MCHC: 32.8 g/dL (ref 30.0–36.0)
MCV: 88.3 fL (ref 78.0–100.0)
PLATELETS: 252 10*3/uL (ref 150–400)
RBC: 4.6 MIL/uL (ref 3.87–5.11)
RDW: 14.6 % (ref 11.5–15.5)
WBC: 4.4 10*3/uL (ref 4.0–10.5)

## 2014-09-29 LAB — BRAIN NATRIURETIC PEPTIDE: B Natriuretic Peptide: 131.8 pg/mL — ABNORMAL HIGH (ref 0.0–100.0)

## 2014-09-29 NOTE — ED Notes (Addendum)
Pt c/o sob x 5 hours tonight.  States it feels like she has CHF. Denies pain.  Pt thinks symptoms are related to her medication (Kombiglyze XR).  States she has had similar episodes in the past and medication was changed.  Speaking in complete sentences.

## 2014-09-29 NOTE — ED Provider Notes (Signed)
CSN: [id number]     Arrival date & time 09/29/14  0121 History  This chart was scribed for Tomasita Crumble, MD by Modena Jansky, ED Scribe. This patient was seen in room A08C/A08C and the patient's care was started at 3:43 AM.   Chief Complaint  Patient presents with  . Shortness of Breath   The history is provided by the patient. No language interpreter was used.   HPI Comments: Renee Bradley is a 69 y.o. female with a hx of DM who presents to the Emergency Department complaining of intermittent moderate SOB that started about 7 hours ago. She reports that she has been having SOB with some shaking that she thinks is due to her new medication, Kombiglyze. She states that she has a prior hx of SOB with a new medication change. She reports that "it feels like no air can come in". She denies any pain. She states that she has some mild cough and congestion. She also denies any headache, fever, rash, or vomiting.   Past Medical History  Diagnosis Date  . Asthma   . Hypertension   . Diabetes mellitus without complication   . Hypercholesterolemia   . Chest pressure 07/13/2012  . SOB (shortness of breath) 07/13/2012  . DM (diabetes mellitus) 07/13/2012  . HTN (hypertension) 07/13/2012  . Tachycardia, unspecified, "fluttering" 07/13/2012   Past Surgical History  Procedure Laterality Date  . Abdominal hysterectomy     Family History  Problem Relation Age of Onset  . Heart attack Mother   . Diabetes type II Mother   . Stroke Mother   . Alzheimer's disease Father   . Diabetes type II Brother    History  Substance Use Topics  . Smoking status: Former Smoker    Quit date: 05/13/1983  . Smokeless tobacco: Never Used  . Alcohol Use: No   OB History    No data available     Review of Systems  10 Systems reviewed and all are negative for acute change except as noted in the HPI.  Allergies  Sulfur  Home Medications   Prior to Admission medications   Medication Sig Start Date End  Date Taking? Authorizing Provider  aliskiren (TEKTURNA) 300 MG tablet Take 300 mg by mouth daily.    Historical Provider, MD  aspirin 81 MG chewable tablet Chew 81 mg by mouth daily.    Historical Provider, MD  atenolol (TENORMIN) 50 MG tablet Take 100 mg by mouth daily.    Historical Provider, MD  atorvastatin (LIPITOR) 20 MG tablet Take 20 mg by mouth daily.    Historical Provider, MD  calcium carbonate (OS-CAL) 600 MG TABS tablet Take 600 mg by mouth daily with breakfast.    Historical Provider, MD  cholecalciferol (VITAMIN D) 1000 UNITS tablet Take 1,000 Units by mouth daily.    Historical Provider, MD  colchicine 0.6 MG tablet Take 0.6 mg by mouth as needed (gout).     Historical Provider, MD  furosemide (LASIX) 20 MG tablet Take 20 mg by mouth daily.     Historical Provider, MD  Saxagliptin-Metformin (KOMBIGLYZE XR) 5-500 MG TB24 Take 1 tablet by mouth daily.    Historical Provider, MD   BP 148/68 mmHg  Pulse 53  Temp(Src) 98.7 F (37.1 C) (Oral)  Resp 20  SpO2 99% Physical Exam  Constitutional: She is oriented to person, place, and time. She appears well-developed and well-nourished. No distress.  HENT:  Head: Normocephalic and atraumatic.  Nose: Nose normal.  Mouth/Throat:  Oropharynx is clear and moist. No oropharyngeal exudate.  Eyes: Conjunctivae and EOM are normal. Pupils are equal, round, and reactive to light. No scleral icterus.  Neck: Normal range of motion. Neck supple. No JVD present. No tracheal deviation present. No thyromegaly present.  Cardiovascular: Normal rate, regular rhythm and normal heart sounds.  Exam reveals no gallop and no friction rub.   No murmur heard. Pulmonary/Chest: Effort normal and breath sounds normal. No respiratory distress. She has no wheezes. She exhibits no tenderness.  Abdominal: Soft. Bowel sounds are normal. She exhibits no distension and no mass. There is no tenderness. There is no rebound and no guarding.  Musculoskeletal: Normal range  of motion. She exhibits no edema or tenderness.  Lymphadenopathy:    She has no cervical adenopathy.  Neurological: She is alert and oriented to person, place, and time. No cranial nerve deficit. She exhibits normal muscle tone.  Skin: Skin is warm and dry. No rash noted. No erythema. No pallor.  Nursing note and vitals reviewed.   ED Course  Procedures (including critical care time) DIAGNOSTIC STUDIES: Oxygen Saturation is 99% on RA, normal by my interpretation.    COORDINATION OF CARE: 3:47 AM- Pt advised of plan for treatment which includes radiology and labs and pt agrees.  Labs Review Labs Reviewed  BASIC METABOLIC PANEL - Abnormal; Notable for the following:    Glucose, Bld 116 (*)    GFR calc non Af Amer 66 (*)    GFR calc Af Amer 76 (*)    All other components within normal limits  BRAIN NATRIURETIC PEPTIDE - Abnormal; Notable for the following:    B Natriuretic Peptide 131.8 (*)    All other components within normal limits  CBC  I-STAT TROPOININ, ED    Imaging Review Dg Chest 2 View  09/29/2014   CLINICAL DATA:  Shortness of breath.  EXAM: CHEST  2 VIEW  COMPARISON:  08/16/2014  FINDINGS: Lower lung volumes accentuating the cardiac size. Pulmonary vasculature is normal. No consolidation, pleural effusion, or pneumothorax. Mild broad-based scoliotic curvature of the thoracic spine and degenerative change is stable. No acute osseous abnormalities are seen.  IMPRESSION: Mild cardiomegaly, accentuated by low lung volumes. No localizing pulmonary process.   Electronically Signed   By: Rubye Oaks M.D.   On: 09/29/2014 02:05     EKG Interpretation None      MDM   Final diagnoses:  None   Patient presents emergency department for congestion. She tells me that it is due to her metformin medication. This has occurred to her on and off for the past year or 2. Her medicine as recently changed 3 weeks ago however this medicine continues to have metformin and it. She  states that he'll intermittently cause her to have a lot of congestion and coughing. She has shortness of breath but denies feelings of her throat closing. It is difficult to tell whether this is a true allergic reaction. Patient is currently asymptomatic. She is advised to continue this medication as she has been on it for years and follow-up with her primary care physician for continued management. If acute distress, her vital signs remain within her normal limits and she is safe for discharge. Bradycardia is due to atenolol use.  I personally performed the services described in this documentation, which was scribed in my presence. The recorded information has been reviewed and is accurate.   Tomasita Crumble, MD 09/29/14 [phone]

## 2014-09-29 NOTE — ED Notes (Signed)
Pt educated to followup with pcp first thing  Monday, pt refuses wheelchair. Ambulatory and no distress noted at discharge.

## 2014-09-29 NOTE — Discharge Instructions (Signed)
Upper Respiratory Infection, Adult Renee Bradley, your blood work, chest xray and ekg did not show a cause of your symptoms.  It does not sound like you are allergic to metformin.  Continue to take it until you can follow up with your primary care physician within 3 days. If any symptoms worsen come back to emergency department immediately. Thank you. An upper respiratory infection (URI) is also known as the common cold. It is often caused by a type of germ (virus). Colds are easily spread (contagious). You can pass it to others by kissing, coughing, sneezing, or drinking out of the same glass. Usually, you get better in 1 or 2 weeks.  HOME CARE   Only take medicine as told by your doctor.  Use a warm mist humidifier or breathe in steam from a hot shower.  Drink enough water and fluids to keep your pee (urine) clear or pale yellow.  Get plenty of rest.  Return to work when your temperature is back to normal or as told by your doctor. You may use a face mask and wash your hands to stop your cold from spreading. GET HELP RIGHT AWAY IF:   After the first few days, you feel you are getting worse.  You have questions about your medicine.  You have chills, shortness of breath, or brown or red spit (mucus).  You have yellow or brown snot (nasal discharge) or pain in the face, especially when you bend forward.  You have a fever, puffy (swollen) neck, pain when you swallow, or white spots in the back of your throat.  You have a bad headache, ear pain, sinus pain, or chest pain.  You have a high-pitched whistling sound when you breathe in and out (wheezing).  You have a lasting cough or cough up blood.  You have sore muscles or a stiff neck. MAKE SURE YOU:   Understand these instructions.  Will watch your condition.  Will get help right away if you are not doing well or get worse. Document Released: 01/06/2008 Document Revised: 10/12/2011 Document Reviewed: 10/25/2013 [facility] Patient  Information 2015 [place], [place]. This information is not intended to replace advice given to you by your health care provider. Make sure you discuss any questions you have with your health care provider.

## 2014-10-01 ENCOUNTER — Encounter (HOSPITAL_COMMUNITY): Payer: Self-pay | Admitting: Family Medicine

## 2014-10-01 ENCOUNTER — Emergency Department (INDEPENDENT_AMBULATORY_CARE_PROVIDER_SITE_OTHER)
Admission: EM | Admit: 2014-10-01 | Discharge: 2014-10-01 | Disposition: A | Discharge status: Home / Self Care | Payer: Medicare Other | Source: Home / Self Care | Attending: Family Medicine | Admitting: Family Medicine

## 2014-10-01 DIAGNOSIS — R7303 Prediabetes: Secondary | ICD-10-CM

## 2014-10-01 DIAGNOSIS — R42 Dizziness and giddiness: Secondary | ICD-10-CM | POA: Diagnosis not present

## 2014-10-01 DIAGNOSIS — I10 Essential (primary) hypertension: Secondary | ICD-10-CM | POA: Diagnosis not present

## 2014-10-01 DIAGNOSIS — R7309 Other abnormal glucose: Secondary | ICD-10-CM | POA: Diagnosis not present

## 2014-10-01 MED ORDER — MECLIZINE HCL 50 MG PO TABS: 25.0000 mg | ORAL_TABLET | Freq: Three times a day (TID) | ORAL | Status: DC | PRN

## 2014-10-01 NOTE — ED Provider Notes (Addendum)
CSN: [id number]     Arrival date & time 10/01/14  0906 History   First MD Initiated Contact with Patient 10/01/14 1006     Chief Complaint  Patient presents with  . Dizziness   (Consider location/radiation/quality/duration/timing/severity/associated sxs/prior Treatment) HPI  Dizziness started today at 01:00. Pt reports getting up to go tto the bathroom and felt dizzy. Mild room spinning and lightheadedness. Checked CBG and was 78. Went back to bed adn felt head continue to spin. Started on Janumet about 4 wks ago. No other medication changes. Fasting CBG in the 80s. Denies CP, SOB, Palpitations. Currently dizziness has resolved.  Endorses eating a very small dinner prior to taking Janumet and going to bed. Patient very concerned about her diabetes overall.  Past Medical History  Diagnosis Date  . Asthma   . Hypertension   . Diabetes mellitus without complication   . Hypercholesterolemia   . Chest pressure 07/13/2012  . SOB (shortness of breath) 07/13/2012  . DM (diabetes mellitus) 07/13/2012  . HTN (hypertension) 07/13/2012  . Tachycardia, unspecified, "fluttering" 07/13/2012   Past Surgical History  Procedure Laterality Date  . Abdominal hysterectomy     Family History  Problem Relation Age of Onset  . Heart attack Mother   . Diabetes type II Mother   . Stroke Mother   . Alzheimer's disease Father   . Diabetes type II Brother    History  Substance Use Topics  . Smoking status: Former Smoker    Quit date: 05/13/1983  . Smokeless tobacco: Never Used  . Alcohol Use: No   OB History    No data available     Review of Systems Precautions given and all questions answered  Allergies  Sulfur  Home Medications   Prior to Admission medications   Medication Sig Start Date End Date Taking? Authorizing Provider  aliskiren (TEKTURNA) 300 MG tablet Take 300 mg by mouth daily.   Yes Historical Provider, MD  aspirin 81 MG chewable tablet Chew 81 mg by mouth daily.   Yes  Historical Provider, MD  atenolol (TENORMIN) 50 MG tablet Take 100 mg by mouth daily.   Yes Historical Provider, MD  atorvastatin (LIPITOR) 20 MG tablet Take 20 mg by mouth daily.   Yes Historical Provider, MD  furosemide (LASIX) 20 MG tablet Take 20 mg by mouth daily.    Yes Historical Provider, MD  JANUMET 50-500 MG per tablet Take 1 tablet by mouth daily. 09/10/14  Yes Historical Provider, MD  calcium carbonate (OS-CAL) 600 MG TABS tablet Take 600 mg by mouth daily with breakfast.    Historical Provider, MD  cholecalciferol (VITAMIN D) 1000 UNITS tablet Take 1,000 Units by mouth daily.    Historical Provider, MD  colchicine 0.6 MG tablet Take 0.6 mg by mouth as needed (gout).     Historical Provider, MD  Saxagliptin-Metformin (KOMBIGLYZE XR) 5-500 MG TB24 Take 1 tablet by mouth daily.    Historical Provider, MD   BP 158/72 mmHg  Pulse 53  Temp(Src) 97.1 F (36.2 C) (Oral)  Resp 12  SpO2 94% Physical Exam  Constitutional: She is oriented to person, place, and time. She appears well-developed and well-nourished.  HENT:  Head: Normocephalic and atraumatic.  Eyes: Conjunctivae are normal. Pupils are equal, round, and reactive to light.  Neck: Normal range of motion.  Cardiovascular: Normal rate.   No murmur heard. Pulmonary/Chest: Effort normal and breath sounds normal.  Abdominal: Soft. Bowel sounds are normal.  Musculoskeletal: Normal range of motion.  Neurological: She is alert and oriented to person, place, and time. No cranial nerve deficit. She exhibits normal muscle tone. Coordination normal.  Skin: Skin is warm and dry.  Psychiatric: She has a normal mood and affect. Thought content normal.    ED Course  Procedures (including critical care time) Labs Review Labs Reviewed - No data to display  Imaging Review No results found.   MDM   1. Dizziness   2. Pre-diabetes   3. Essential hypertension    Dizzy episode likely secondary to multiple factors including lower  glucose, recent viral URI which has resolved, Ortho-stasis. Patient with resolved symptoms at this time.  meclizine provided if patient develops daytime symptoms. No neurological deficits  concern for intracranial process. Prediabetes: Patient reports last A1c of 6.0. Discussed favoring higher sugar as opposed to the dangers of low sugar levels. Patient to hold off on metformin until following up with PCP tomorrow. Patient's given reassurance over concerns that symptoms might be due to DKA.   Blood pressure slightly elevated today. Patient reports taking medications. Slight increased likely due to anxiety of her current situation. Patient to follow-up with PCP.  Precautions given and all questions answered  Shelly Flatten, MD Family Medicine 10/01/2014, 10:34 AM     Ozella Rocks, MD 10/01/14 1034  Ozella Rocks, MD 10/01/14 1035  Ozella Rocks, MD 10/01/14 1049

## 2014-10-01 NOTE — ED Notes (Signed)
Pt states she woke up at 0100 this morning and after going to the restroom, she felt dizzy.  She checked her blood sugar and it was 78.  She ate some crackers, and at 0200 her blood sugar was 120.  This is the only episode of dizziness she has had.  She has an appointment with her PCP tomorrow morning.  Pt is A&O w/ VSS at this time.

## 2014-10-01 NOTE — Discharge Instructions (Signed)
Your symptoms of dizziness were likely due to several things including lower blood sugar, low blood pressure, and possibly in inner ear irritation from a recent viral infection. Please make sure to monitor her diabetes closely and avoid sugars go too low. Please use caution when getting up in the middle the night as it is common for your blood pressure to be low at this point in time. Please use the meclizine if you develop daytime symptoms that get worse especially with turning her head. Please follow up with your primary care physician tomorrow.

## 2014-10-02 DIAGNOSIS — R0602 Shortness of breath: Secondary | ICD-10-CM | POA: Diagnosis not present

## 2014-10-02 DIAGNOSIS — N08 Glomerular disorders in diseases classified elsewhere: Secondary | ICD-10-CM | POA: Diagnosis not present

## 2014-10-02 DIAGNOSIS — E1122 Type 2 diabetes mellitus with diabetic chronic kidney disease: Secondary | ICD-10-CM | POA: Diagnosis not present

## 2014-10-02 DIAGNOSIS — N182 Chronic kidney disease, stage 2 (mild): Secondary | ICD-10-CM | POA: Diagnosis not present

## 2014-10-23 DIAGNOSIS — I10 Essential (primary) hypertension: Secondary | ICD-10-CM | POA: Diagnosis not present

## 2014-10-23 DIAGNOSIS — R7301 Impaired fasting glucose: Secondary | ICD-10-CM | POA: Diagnosis not present

## 2014-10-23 DIAGNOSIS — Z79899 Other long term (current) drug therapy: Secondary | ICD-10-CM | POA: Diagnosis not present

## 2014-10-30 ENCOUNTER — Emergency Department (HOSPITAL_COMMUNITY)
Admission: EM | Admit: 2014-10-30 | Discharge: 2014-10-30 | Disposition: A | Discharge status: Home / Self Care | Payer: Medicare Other | Attending: Emergency Medicine | Admitting: Emergency Medicine

## 2014-10-30 ENCOUNTER — Encounter (HOSPITAL_COMMUNITY): Payer: Self-pay | Admitting: *Deleted

## 2014-10-30 DIAGNOSIS — I10 Essential (primary) hypertension: Secondary | ICD-10-CM | POA: Diagnosis not present

## 2014-10-30 DIAGNOSIS — Z87891 Personal history of nicotine dependence: Secondary | ICD-10-CM | POA: Diagnosis not present

## 2014-10-30 DIAGNOSIS — Z79899 Other long term (current) drug therapy: Secondary | ICD-10-CM | POA: Diagnosis not present

## 2014-10-30 DIAGNOSIS — H9313 Tinnitus, bilateral: Secondary | ICD-10-CM | POA: Diagnosis not present

## 2014-10-30 DIAGNOSIS — E780 Pure hypercholesterolemia: Secondary | ICD-10-CM | POA: Insufficient documentation

## 2014-10-30 DIAGNOSIS — J45909 Unspecified asthma, uncomplicated: Secondary | ICD-10-CM | POA: Diagnosis not present

## 2014-10-30 DIAGNOSIS — E119 Type 2 diabetes mellitus without complications: Secondary | ICD-10-CM | POA: Insufficient documentation

## 2014-10-30 LAB — CBG MONITORING, ED: Glucose-Capillary: 78 mg/dL (ref 70–99)

## 2014-10-30 NOTE — ED Notes (Signed)
Pt c/o both ears ringing since 11pm yesterday. Pt states that she was having chills.

## 2014-10-30 NOTE — Discharge Instructions (Signed)
See the ENT doctor as requested. Return to the ER if there is spinning sensation, you pass out, numbness, tingling, rash.   Tinnitus Sounds you hear in your ears and coming from within the ear is called tinnitus. This can be a symptom of many ear disorders. It is often associated with hearing loss.  Tinnitus can be seen with:  Infections.  Ear blockages such as wax buildup.  Meniere's disease.  Ear damage.  Inherited.  Occupational causes. While irritating, it is not usually a threat to health. When the cause of the tinnitus is wax, infection in the middle ear, or foreign body it is easily treated. Hearing loss will usually be reversible.  TREATMENT  When treating the underlying cause does not get rid of tinnitus, it may be necessary to get rid of the unwanted sound by covering it up with more pleasant background noises. This may include music, the radio etc. There are tinnitus maskers which can be worn which produce background noise to cover up the tinnitus. Avoid all medications which tend to make tinnitus worse such as alcohol, caffeine, aspirin, and nicotine. There are many soothing background tapes such as rain, ocean, thunderstorms, etc. These soothing sounds help with sleeping or resting. Keep all follow-up appointments and referrals. This is important to identify the cause of the problem. It also helps avoid complications, impaired hearing, disability, or chronic pain. Document Released: 07/20/2005 Document Revised: 10/12/2011 Document Reviewed: 03/07/2008 [facility] Patient Information 2015 [place], [place]. This information is not intended to replace advice given to you by your health care provider. Make sure you discuss any questions you have with your health care provider.

## 2014-10-30 NOTE — ED Provider Notes (Signed)
CSN: [id number]     Arrival date & time 10/30/14  0411 History   First MD Initiated Contact with Patient 10/30/14 0441     Chief Complaint  Patient presents with  . Tinnitus     (Consider location/radiation/quality/duration/timing/severity/associated sxs/prior Treatment) HPI Comments: Pt comes in with cc of ringing in the ear. Pt has hx of borderline DM. She has no hx of strokes. Reports that she started having constant ringing in the ear around 11 pm. The ringing was high pitched initially, now low pitch, and is constant. The ringing is not pulsatile and is not worse with any movement/position. There is no jaw pain, headaches, drainage, trauma to the ear. No numbness, tingling, vertigo. No new meds.  The history is provided by the patient.    Past Medical History  Diagnosis Date  . Asthma   . Hypertension   . Diabetes mellitus without complication   . Hypercholesterolemia   . Chest pressure 07/13/2012  . SOB (shortness of breath) 07/13/2012  . DM (diabetes mellitus) 07/13/2012  . HTN (hypertension) 07/13/2012  . Tachycardia, unspecified, "fluttering" 07/13/2012   Past Surgical History  Procedure Laterality Date  . Abdominal hysterectomy     Family History  Problem Relation Age of Onset  . Heart attack Mother   . Diabetes type II Mother   . Stroke Mother   . Alzheimer's disease Father   . Diabetes type II Brother    History  Substance Use Topics  . Smoking status: Former Smoker    Quit date: 05/13/1983  . Smokeless tobacco: Never Used  . Alcohol Use: No   OB History    No data available     Review of Systems  Constitutional: Negative for fever.  HENT: Positive for tinnitus. Negative for ear discharge and ear pain.   Gastrointestinal: Negative for nausea.  Allergic/Immunologic: Negative for immunocompromised state.  Neurological: Negative for dizziness, facial asymmetry, speech difficulty, weakness, light-headedness, numbness and headaches.      Allergies   Janumet; Januvia; Kombiglyze; and Sulfur  Home Medications   Prior to Admission medications   Medication Sig Start Date End Date Taking? Authorizing Provider  aliskiren (TEKTURNA) 300 MG tablet Take 300 mg by mouth daily.   Yes Historical Provider, MD  atenolol (TENORMIN) 50 MG tablet Take 100 mg by mouth daily.   Yes Historical Provider, MD  atorvastatin (LIPITOR) 20 MG tablet Take 20 mg by mouth daily.   Yes Historical Provider, MD  calcium carbonate (OS-CAL) 600 MG TABS tablet Take 600 mg by mouth daily with breakfast.   Yes Historical Provider, MD  cholecalciferol (VITAMIN D) 1000 UNITS tablet Take 1,000 Units by mouth daily.   Yes Historical Provider, MD  furosemide (LASIX) 20 MG tablet Take 20 mg by mouth daily.    Yes Historical Provider, MD  meclizine (ANTIVERT) 50 MG tablet Take 0.5-1 tablets (25-50 mg total) by mouth 3 (three) times daily as needed. 10/01/14  Yes Ozella Rocks, MD   BP 134/65 mmHg  Pulse 54  Temp(Src) 98.2 F (36.8 C) (Oral)  Resp 16  Ht 5\' 3"  (1.6 m)  Wt 186 lb 12.8 oz (84.732 kg)  BMI 33.10 kg/m2  SpO2 98% Physical Exam  Constitutional: She is oriented to person, place, and time. She appears well-developed.  HENT:  Head: Normocephalic.  Right Ear: External ear normal.  Left Ear: External ear normal.  Mouth/Throat: Oropharynx is clear and moist.  Bilateral TM appear normal - there is no rash, no cerumen impaction  Neck: Neck supple.  Cardiovascular: Normal rate and regular rhythm.   Pulmonary/Chest: Effort normal.  Lymphadenopathy:    She has no cervical adenopathy.  Neurological: She is alert and oriented to person, place, and time. No cranial nerve deficit. Coordination normal.  Nursing note and vitals reviewed.   ED Course  Procedures (including critical care time) Labs Review Labs Reviewed - No data to display  Imaging Review No results found.   EKG Interpretation None      MDM   Final diagnoses:  Tinnitus of both ears   Pt  comes in with of ear ringing. Symptoms are bilateral, maybe right worse than left. Neuro exam is non focal. No rash. No cerumen impaction. She takes low dose aspirin and lasix - which are the only ototoxic meds.  DDX: Vasculitis, Meniere's dz, Neuroma/Schwnoma, new hearing loss.  The sx are bilateral, so unlikely to be strokes. No vertigo - thus unlikely to be meniere's dz. MRI in the ER not indicated-  Even meniere's is not diagnostic with it.  Advised to see ENT doctor for optimal evaluation, as the etiology appears to be non emergent.  Derwood Kaplan, MD 10/30/14 [phone]

## 2014-10-30 NOTE — ED Notes (Signed)
CBG 78. 

## 2014-11-13 DIAGNOSIS — E780 Pure hypercholesterolemia: Secondary | ICD-10-CM | POA: Diagnosis not present

## 2014-11-13 DIAGNOSIS — I10 Essential (primary) hypertension: Secondary | ICD-10-CM | POA: Diagnosis not present

## 2014-11-13 DIAGNOSIS — R634 Abnormal weight loss: Secondary | ICD-10-CM | POA: Diagnosis not present

## 2014-11-13 DIAGNOSIS — E1165 Type 2 diabetes mellitus with hyperglycemia: Secondary | ICD-10-CM | POA: Diagnosis not present

## 2014-11-29 ENCOUNTER — Ambulatory Visit: Payer: Medicare Other | Admitting: Cardiology

## 2014-12-17 DIAGNOSIS — I129 Hypertensive chronic kidney disease with stage 1 through stage 4 chronic kidney disease, or unspecified chronic kidney disease: Secondary | ICD-10-CM | POA: Diagnosis not present

## 2014-12-17 DIAGNOSIS — E1122 Type 2 diabetes mellitus with diabetic chronic kidney disease: Secondary | ICD-10-CM | POA: Diagnosis not present

## 2014-12-17 DIAGNOSIS — N182 Chronic kidney disease, stage 2 (mild): Secondary | ICD-10-CM | POA: Diagnosis not present

## 2014-12-17 DIAGNOSIS — N08 Glomerular disorders in diseases classified elsewhere: Secondary | ICD-10-CM | POA: Diagnosis not present

## 2014-12-21 ENCOUNTER — Encounter: Payer: Self-pay | Admitting: Cardiovascular Disease

## 2015-03-03 ENCOUNTER — Encounter (HOSPITAL_COMMUNITY): Payer: Self-pay | Admitting: *Deleted

## 2015-03-03 ENCOUNTER — Emergency Department (HOSPITAL_COMMUNITY)
Admission: EM | Admit: 2015-03-03 | Discharge: 2015-03-03 | Disposition: A | Discharge status: Other Acute Inpatient Hospital | Payer: Medicare Other | Attending: Emergency Medicine | Admitting: Emergency Medicine

## 2015-03-03 DIAGNOSIS — Y9389 Activity, other specified: Secondary | ICD-10-CM | POA: Insufficient documentation

## 2015-03-03 DIAGNOSIS — T23302A Burn of third degree of left hand, unspecified site, initial encounter: Secondary | ICD-10-CM | POA: Diagnosis not present

## 2015-03-03 DIAGNOSIS — T22212A Burn of second degree of left forearm, initial encounter: Secondary | ICD-10-CM | POA: Diagnosis not present

## 2015-03-03 DIAGNOSIS — E780 Pure hypercholesterolemia: Secondary | ICD-10-CM | POA: Diagnosis not present

## 2015-03-03 DIAGNOSIS — Y998 Other external cause status: Secondary | ICD-10-CM | POA: Insufficient documentation

## 2015-03-03 DIAGNOSIS — E119 Type 2 diabetes mellitus without complications: Secondary | ICD-10-CM | POA: Insufficient documentation

## 2015-03-03 DIAGNOSIS — I10 Essential (primary) hypertension: Secondary | ICD-10-CM | POA: Insufficient documentation

## 2015-03-03 DIAGNOSIS — T310 Burns involving less than 10% of body surface: Secondary | ICD-10-CM | POA: Diagnosis not present

## 2015-03-03 DIAGNOSIS — X12XXXA Contact with other hot fluids, initial encounter: Secondary | ICD-10-CM | POA: Insufficient documentation

## 2015-03-03 DIAGNOSIS — Z87891 Personal history of nicotine dependence: Secondary | ICD-10-CM | POA: Insufficient documentation

## 2015-03-03 DIAGNOSIS — T2220XA Burn of second degree of shoulder and upper limb, except wrist and hand, unspecified site, initial encounter: Secondary | ICD-10-CM

## 2015-03-03 DIAGNOSIS — Y9289 Other specified places as the place of occurrence of the external cause: Secondary | ICD-10-CM | POA: Diagnosis not present

## 2015-03-03 DIAGNOSIS — G8911 Acute pain due to trauma: Secondary | ICD-10-CM | POA: Diagnosis not present

## 2015-03-03 DIAGNOSIS — T23372A Burn of third degree of left wrist, initial encounter: Secondary | ICD-10-CM | POA: Diagnosis not present

## 2015-03-03 DIAGNOSIS — T23202A Burn of second degree of left hand, unspecified site, initial encounter: Secondary | ICD-10-CM | POA: Diagnosis not present

## 2015-03-03 DIAGNOSIS — T22312A Burn of third degree of left forearm, initial encounter: Secondary | ICD-10-CM | POA: Diagnosis not present

## 2015-03-03 DIAGNOSIS — X030XXA Exposure to flames in controlled fire, not in building or structure, initial encounter: Secondary | ICD-10-CM | POA: Diagnosis not present

## 2015-03-03 DIAGNOSIS — J45909 Unspecified asthma, uncomplicated: Secondary | ICD-10-CM | POA: Insufficient documentation

## 2015-03-03 DIAGNOSIS — T22239A Burn of second degree of unspecified upper arm, initial encounter: Secondary | ICD-10-CM | POA: Diagnosis not present

## 2015-03-03 DIAGNOSIS — T23272A Burn of second degree of left wrist, initial encounter: Secondary | ICD-10-CM | POA: Diagnosis not present

## 2015-03-03 DIAGNOSIS — T22012A Burn of unspecified degree of left forearm, initial encounter: Secondary | ICD-10-CM | POA: Diagnosis present

## 2015-03-03 LAB — CBG MONITORING, ED: GLUCOSE-CAPILLARY: 101 mg/dL — AB (ref 65–99)

## 2015-03-03 MED ORDER — HYDROMORPHONE HCL 1 MG/ML IJ SOLN
1.0000 mg | Freq: Once | INTRAMUSCULAR | Status: AC
  Administered 2015-03-03: 1 mg via INTRAVENOUS
  Filled 2015-03-03: qty 1

## 2015-03-03 MED ORDER — SODIUM CHLORIDE 0.9 % IV BOLUS (SEPSIS)
1000.0000 mL | Freq: Once | INTRAVENOUS | Status: AC
  Administered 2015-03-03: 1000 mL via INTRAVENOUS

## 2015-03-03 MED ORDER — SILVER SULFADIAZINE 1 % EX CREA
TOPICAL_CREAM | Freq: Once | CUTANEOUS | Status: AC
  Administered 2015-03-03: 1 via TOPICAL
  Filled 2015-03-03: qty 85

## 2015-03-03 MED ORDER — SODIUM CHLORIDE 0.9 % IV SOLN
3.0000 g | Freq: Once | INTRAVENOUS | Status: AC
  Administered 2015-03-03: 3 g via INTRAVENOUS
  Filled 2015-03-03: qty 3

## 2015-03-03 MED ORDER — LORAZEPAM 2 MG/ML IJ SOLN: 1.0000 mg | Freq: Once | INTRAMUSCULAR | Status: DC

## 2015-03-03 NOTE — ED Notes (Signed)
MD at bedside. 
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
## 2015-03-03 NOTE — ED Notes (Signed)
Pt has normal ROM to hand and arm.

## 2015-03-04 DIAGNOSIS — T310 Burns involving less than 10% of body surface: Secondary | ICD-10-CM | POA: Diagnosis not present

## 2015-03-04 DIAGNOSIS — T22212A Burn of second degree of left forearm, initial encounter: Secondary | ICD-10-CM | POA: Diagnosis not present

## 2015-03-04 DIAGNOSIS — I10 Essential (primary) hypertension: Secondary | ICD-10-CM | POA: Diagnosis not present

## 2015-03-04 DIAGNOSIS — E119 Type 2 diabetes mellitus without complications: Secondary | ICD-10-CM | POA: Diagnosis not present

## 2015-03-04 DIAGNOSIS — T23202A Burn of second degree of left hand, unspecified site, initial encounter: Secondary | ICD-10-CM | POA: Diagnosis not present

## 2015-03-04 DIAGNOSIS — G8911 Acute pain due to trauma: Secondary | ICD-10-CM | POA: Diagnosis not present

## 2015-03-05 DIAGNOSIS — E780 Pure hypercholesterolemia: Secondary | ICD-10-CM | POA: Diagnosis not present

## 2015-03-05 DIAGNOSIS — G8911 Acute pain due to trauma: Secondary | ICD-10-CM | POA: Diagnosis present

## 2015-03-05 DIAGNOSIS — T22312A Burn of third degree of left forearm, initial encounter: Secondary | ICD-10-CM | POA: Diagnosis present

## 2015-03-05 DIAGNOSIS — T23302A Burn of third degree of left hand, unspecified site, initial encounter: Secondary | ICD-10-CM | POA: Diagnosis present

## 2015-03-05 DIAGNOSIS — T2230XA Burn of third degree of shoulder and upper limb, except wrist and hand, unspecified site, initial encounter: Secondary | ICD-10-CM | POA: Diagnosis not present

## 2015-03-05 DIAGNOSIS — Z79899 Other long term (current) drug therapy: Secondary | ICD-10-CM | POA: Diagnosis not present

## 2015-03-05 DIAGNOSIS — J4540 Moderate persistent asthma, uncomplicated: Secondary | ICD-10-CM | POA: Diagnosis not present

## 2015-03-05 DIAGNOSIS — Z6831 Body mass index (BMI) 31.0-31.9, adult: Secondary | ICD-10-CM | POA: Diagnosis not present

## 2015-03-05 DIAGNOSIS — I10 Essential (primary) hypertension: Secondary | ICD-10-CM | POA: Diagnosis not present

## 2015-03-05 DIAGNOSIS — T310 Burns involving less than 10% of body surface: Secondary | ICD-10-CM | POA: Diagnosis not present

## 2015-03-05 DIAGNOSIS — T23372A Burn of third degree of left wrist, initial encounter: Secondary | ICD-10-CM | POA: Diagnosis present

## 2015-03-05 DIAGNOSIS — Z888 Allergy status to other drugs, medicaments and biological substances status: Secondary | ICD-10-CM | POA: Diagnosis not present

## 2015-03-05 DIAGNOSIS — Z882 Allergy status to sulfonamides status: Secondary | ICD-10-CM | POA: Diagnosis not present

## 2015-03-05 DIAGNOSIS — Z833 Family history of diabetes mellitus: Secondary | ICD-10-CM | POA: Diagnosis not present

## 2015-03-05 DIAGNOSIS — Z90710 Acquired absence of both cervix and uterus: Secondary | ICD-10-CM | POA: Diagnosis not present

## 2015-03-05 DIAGNOSIS — E669 Obesity, unspecified: Secondary | ICD-10-CM | POA: Diagnosis not present

## 2015-03-05 DIAGNOSIS — T23392A Burn of third degree of multiple sites of left wrist and hand, initial encounter: Secondary | ICD-10-CM | POA: Diagnosis not present

## 2015-03-05 DIAGNOSIS — E119 Type 2 diabetes mellitus without complications: Secondary | ICD-10-CM | POA: Diagnosis not present

## 2015-03-05 DIAGNOSIS — T23342A Burn of third degree of multiple left fingers (nail), including thumb, initial encounter: Secondary | ICD-10-CM | POA: Diagnosis not present

## 2015-03-05 DIAGNOSIS — E785 Hyperlipidemia, unspecified: Secondary | ICD-10-CM | POA: Diagnosis not present

## 2015-03-05 DIAGNOSIS — Z7982 Long term (current) use of aspirin: Secondary | ICD-10-CM | POA: Diagnosis not present

## 2015-03-06 ENCOUNTER — Telehealth: Payer: Self-pay | Admitting: Cardiovascular Disease
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
## 2015-03-12 DIAGNOSIS — I10 Essential (primary) hypertension: Secondary | ICD-10-CM | POA: Diagnosis not present

## 2015-03-12 DIAGNOSIS — Z79899 Other long term (current) drug therapy: Secondary | ICD-10-CM | POA: Diagnosis not present

## 2015-03-12 DIAGNOSIS — Z7982 Long term (current) use of aspirin: Secondary | ICD-10-CM | POA: Diagnosis not present

## 2015-03-12 DIAGNOSIS — Z4800 Encounter for change or removal of nonsurgical wound dressing: Secondary | ICD-10-CM | POA: Diagnosis not present

## 2015-03-12 DIAGNOSIS — E119 Type 2 diabetes mellitus without complications: Secondary | ICD-10-CM | POA: Diagnosis not present

## 2015-03-12 DIAGNOSIS — Z882 Allergy status to sulfonamides status: Secondary | ICD-10-CM | POA: Diagnosis not present

## 2015-03-12 DIAGNOSIS — Z888 Allergy status to other drugs, medicaments and biological substances status: Secondary | ICD-10-CM | POA: Diagnosis not present

## 2015-03-25 ENCOUNTER — Ambulatory Visit: Payer: Medicare Other | Attending: Surgical | Admitting: Occupational Therapy

## 2015-03-25 DIAGNOSIS — M6289 Other specified disorders of muscle: Secondary | ICD-10-CM | POA: Diagnosis not present

## 2015-03-25 DIAGNOSIS — M25632 Stiffness of left wrist, not elsewhere classified: Secondary | ICD-10-CM | POA: Diagnosis not present

## 2015-03-25 DIAGNOSIS — R609 Edema, unspecified: Secondary | ICD-10-CM

## 2015-03-25 DIAGNOSIS — R29898 Other symptoms and signs involving the musculoskeletal system: Secondary | ICD-10-CM

## 2015-03-25 NOTE — Patient Instructions (Addendum)
AROM: Wrist Extension   .  With left palm down, bend wrist up. Repeat __10__ times per set.  Do __6-8__ sessions per day.                                Dr. Mayford Knife,  Pt was seen for Occupational therapy evaluation today.  Could you clarify the following:  1. Is pt is cleared for PROM?  2. Is pt cleared for gentle strengthening of L hand?  3.  Does pt still need to use xeroform for dressing?  Thank you,  Willa Frater, OTR/L 03/25/2015 4:11 PM [facility] [phone]
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
## 2015-03-26 DIAGNOSIS — T2230XD Burn of third degree of shoulder and upper limb, except wrist and hand, unspecified site, subsequent encounter: Secondary | ICD-10-CM | POA: Diagnosis not present

## 2015-03-26 DIAGNOSIS — E119 Type 2 diabetes mellitus without complications: Secondary | ICD-10-CM | POA: Diagnosis not present

## 2015-03-26 DIAGNOSIS — M25632 Stiffness of left wrist, not elsewhere classified: Secondary | ICD-10-CM | POA: Diagnosis not present

## 2015-03-26 DIAGNOSIS — R609 Edema, unspecified: Secondary | ICD-10-CM | POA: Diagnosis not present

## 2015-03-26 DIAGNOSIS — G8911 Acute pain due to trauma: Secondary | ICD-10-CM | POA: Diagnosis not present

## 2015-03-26 DIAGNOSIS — T23322D Burn of third degree of single left finger (nail) except thumb, subsequent encounter: Secondary | ICD-10-CM | POA: Diagnosis not present

## 2015-04-09 ENCOUNTER — Ambulatory Visit: Payer: Medicare Other | Attending: Surgical | Admitting: Occupational Therapy

## 2015-04-09 DIAGNOSIS — M25632 Stiffness of left wrist, not elsewhere classified: Secondary | ICD-10-CM | POA: Insufficient documentation

## 2015-04-09 DIAGNOSIS — R609 Edema, unspecified: Secondary | ICD-10-CM | POA: Diagnosis not present

## 2015-04-09 DIAGNOSIS — M6289 Other specified disorders of muscle: Secondary | ICD-10-CM | POA: Insufficient documentation

## 2015-04-09 DIAGNOSIS — R29898 Other symptoms and signs involving the musculoskeletal system: Secondary | ICD-10-CM
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
## 2015-04-09 NOTE — Patient Instructions (Signed)
Flexor Tendon Gliding (Active Hook Fist)   With fingers and knuckles straight, bend middle and tip joints. Do not bend large knuckles. Repeat 15 times. Do 3 sessions per day.   Flexor Tendon Gliding (Active Full Fist)   Straighten all fingers, then make a fist, bending all joints. Repeat 15 times. Do 3sessions per day.   Flexor Tendon Gliding (Active Straight Fist)   Start with fingers straight. Bend knuckles and middle joints. Keep fingertip joints straight to touch base of palm. Repeat 15 times. Do 3 sessions per day.   MP Flexion (Active Isolated)   Bend ALL fingers at large knuckle, keeping other fingers straight. Do not bend tips. Repeat 15 times. Do 3 sessions per day.   PROM: Wrist Flexion / Extension   Grasp  hand and slowly bend wrist until stretch is felt. Relax. Then stretch as far as possible in opposite direction. Be sure to keep elbow bent.  Hold __20__ sec. each way Repeat _5___ times per set.    Do _3___ sessions per day.   1. Grip Strengthening (Resistive Putty)   Squeeze putty using thumb and all fingers. Repeat 20 times. Do 2 sessions per day.   Extension (Assistive Putty)   Roll putty back and forth, being sure to use all fingertips. Repeat 3 times. Do 2 sessions per day.  Then pinch as below.   Palmar Pinch Strengthening (Resistive Putty)   Pinch putty between thumb and each fingertip in turn after rolling out.     Finger and Thumb Extension (Resistive Putty)   With thumb and all fingers in center of putty donut, stretch out. Repeat 10 times. Do 2 sessions per day.      IP Fisting (Resistive Putty)   Keeping knuckles straight, bend fingertips to squeeze putty. Repeat 10 times. Do 2 sessions per day.  MP Flexion (Resistive Putty)   Bending only at large knuckles, press putty down against thumb. Keep fingertips straight. Repeat 10 times. Do 2 sessions per day.

## 2015-04-12 ENCOUNTER — Ambulatory Visit: Payer: Medicare Other | Admitting: Occupational Therapy

## 2015-04-12 DIAGNOSIS — R609 Edema, unspecified: Secondary | ICD-10-CM

## 2015-04-12 DIAGNOSIS — M25632 Stiffness of left wrist, not elsewhere classified: Secondary | ICD-10-CM | POA: Diagnosis not present

## 2015-04-12 DIAGNOSIS — R29898 Other symptoms and signs involving the musculoskeletal system: Secondary | ICD-10-CM

## 2015-04-12 DIAGNOSIS — M6289 Other specified disorders of muscle: Secondary | ICD-10-CM | POA: Diagnosis not present
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
## 2015-04-16 ENCOUNTER — Ambulatory Visit: Payer: Medicare Other | Admitting: Occupational Therapy

## 2015-04-16 DIAGNOSIS — R29898 Other symptoms and signs involving the musculoskeletal system: Secondary | ICD-10-CM

## 2015-04-16 DIAGNOSIS — M25632 Stiffness of left wrist, not elsewhere classified: Secondary | ICD-10-CM

## 2015-04-16 DIAGNOSIS — R609 Edema, unspecified: Secondary | ICD-10-CM | POA: Diagnosis not present

## 2015-04-16 DIAGNOSIS — M6289 Other specified disorders of muscle: Secondary | ICD-10-CM | POA: Diagnosis not present
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
## 2015-04-19 ENCOUNTER — Ambulatory Visit: Payer: Medicare Other | Admitting: Occupational Therapy

## 2015-04-19 DIAGNOSIS — R609 Edema, unspecified: Secondary | ICD-10-CM

## 2015-04-19 DIAGNOSIS — R29898 Other symptoms and signs involving the musculoskeletal system: Secondary | ICD-10-CM

## 2015-04-19 DIAGNOSIS — M25632 Stiffness of left wrist, not elsewhere classified: Secondary | ICD-10-CM | POA: Diagnosis not present

## 2015-04-19 DIAGNOSIS — M6289 Other specified disorders of muscle: Secondary | ICD-10-CM | POA: Diagnosis not present

## 2015-04-19 NOTE — Therapy (Signed)
[facility] Rehabilitation [facility] [address] [place], [place], [address] Phone: [phone]   Fax:  [phone]  Occupational Therapy Treatment  Patient Details  Name: Renee Bradley MRN: [id number] Date of Birth: 09/13/1945 Referring [place] Capers:  Dorothyann Peng, MD  Encounter Date: 04/19/2015      OT End of Session - 04/19/15 1602    Visit Number 5   Number of Visits 17   Date for OT Re-Evaluation 05/24/15   Authorization Type Medicare, BCBS--G code needed   Authorization - Visit Number 5   Authorization - Number of Visits 10   OT Start Time 1455   OT Stop Time 1530   OT Time Calculation (min) 35 min   Activity Tolerance Patient tolerated treatment well   Behavior During Therapy Anxious      Past Medical History  Diagnosis Date  . Asthma   . Hypertension   . Diabetes mellitus without complication   . Hypercholesterolemia   . Chest pressure 07/13/2012  . SOB (shortness of breath) 07/13/2012  . DM (diabetes mellitus) 07/13/2012  . HTN (hypertension) 07/13/2012  . Tachycardia, unspecified, "fluttering" 07/13/2012    Past Surgical History  Procedure Laterality Date  . Abdominal hysterectomy      There were no vitals filed for this visit.  Visit Diagnosis:  Stiffness of left wrist joint  Weakness of left hand  Edema   Treatment: Manual therapy: P/ROM to MP's and digits compositely, as well as P/ROM wrist flexion extension. Theraputic ex: A/ROM wrist flexion/ extension x 10, self ROM prayer position x 5, tendon gliding x 10 reps Theraputic BDZ:[id number] yellow putty HEP for grip and pinch, pt returned demonstration.  Sustained grip and pinch along with functional reaching to place/ remove graded clothespins from vertical antennae, all colors except for black(most resistive at 8 lbs)                           OT Short Term Goals - 03/25/15 1652    OT SHORT TERM GOAL #1   Title Pt will be  independent with initial ROM HEP.--check STGs 04/24/15   Time 4   Period Weeks   Status New   OT SHORT TERM GOAL #2   Title Pt will demo at least 50 degrees wrist extension for ADLs.   Baseline 30*   Time 4   Period Weeks   Status New   OT SHORT TERM GOAL #3   Title Pt will report ability to use LUE as nondominant assist at least 75% of the time.   Time 4   Period Weeks   Status New           OT Long Term Goals - 03/25/15 1654    OT LONG TERM GOAL #1   Title Pt will be independent with updated strengthening HEP.--check LTGs 05/24/15   Time 8   Period Weeks   Status New   OT LONG TERM GOAL #2   Title Pt will demo at least 60* L wrist extension for ADLs.   Baseline 30*   Time 8   Period Weeks   Status New   OT LONG TERM GOAL #3   Title Pt will demo at least 35lbs L grip strength for ADLs/opening containers.   Baseline not tested due to precautions   Time 8   Period Weeks   Status New   OT LONG TERM GOAL #4   Title Pt will report using LUE  as nondominant assist at least 95% of the time.   Time 8   Period Weeks   Status New               Plan - 04/19/15 1548    Clinical Impression Statement Pt is progressing towards goals for improved ROM and strength.   Pt will benefit from skilled therapeutic intervention in order to improve on the following deficits (Retired) Decreased strength;Pain;Impaired UE functional use;Decreased skin integrity;Increased edema;Decreased coordination;Decreased range of motion   Rehab Potential Good   OT Frequency 2x / week   OT Duration 8 weeks   OT Treatment/Interventions Self-care/ADL training;Therapeutic exercise;Patient/family education;Splinting;Manual Therapy;Neuromuscular education;Ultrasound;Therapeutic exercises;Cryotherapy;DME and/or AE instruction;Therapeutic activities;Fluidtherapy;Moist Heat;Passive range of motion;Electrical Stimulation   Plan continue to progress  ROM and strength as tolerated   OT Home Exercise Plan  Education provided:  wrist ext AROM; 04/09/15:  yellow putty, wrist PROM, tendon glides HEP   Consulted and Agree with Plan of Care Patient        Problem List Patient Active Problem List   Diagnosis Date Noted  . Chest pressure 07/13/2012  . SOB (shortness of breath) 07/13/2012  . DM (diabetes mellitus) 07/13/2012  . HTN (hypertension) 07/13/2012  . Hyperlipidemia 07/13/2012  . Tachycardia, unspecified, "fluttering" 07/13/2012    RINE,KATHRYN 04/19/2015, 4:03 PM Keene Breath, OTR/L [phone] Phone: [phone] 4:04 PM 04/19/2015 [facility] [address] [place], [place], [address] Phone: [phone]   Fax:  [phone]

## 2015-04-22 DIAGNOSIS — M25639 Stiffness of unspecified wrist, not elsewhere classified: Secondary | ICD-10-CM | POA: Diagnosis not present

## 2015-04-22 DIAGNOSIS — E109 Type 1 diabetes mellitus without complications: Secondary | ICD-10-CM | POA: Diagnosis not present

## 2015-04-22 DIAGNOSIS — I10 Essential (primary) hypertension: Secondary | ICD-10-CM | POA: Diagnosis not present

## 2015-04-22 DIAGNOSIS — M792 Neuralgia and neuritis, unspecified: Secondary | ICD-10-CM | POA: Diagnosis not present

## 2015-04-22 DIAGNOSIS — E780 Pure hypercholesterolemia: Secondary | ICD-10-CM | POA: Diagnosis not present

## 2015-04-22 DIAGNOSIS — Z79899 Other long term (current) drug therapy: Secondary | ICD-10-CM | POA: Diagnosis not present

## 2015-04-23 ENCOUNTER — Ambulatory Visit: Payer: Medicare Other | Admitting: Occupational Therapy

## 2015-04-23 DIAGNOSIS — R29898 Other symptoms and signs involving the musculoskeletal system: Secondary | ICD-10-CM

## 2015-04-23 DIAGNOSIS — M6289 Other specified disorders of muscle: Secondary | ICD-10-CM | POA: Diagnosis not present

## 2015-04-23 DIAGNOSIS — R609 Edema, unspecified: Secondary | ICD-10-CM | POA: Diagnosis not present

## 2015-04-23 DIAGNOSIS — M25632 Stiffness of left wrist, not elsewhere classified: Secondary | ICD-10-CM
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
## 2015-04-26 ENCOUNTER — Ambulatory Visit: Payer: Medicare Other | Admitting: Occupational Therapy

## 2015-04-26 DIAGNOSIS — M25632 Stiffness of left wrist, not elsewhere classified: Secondary | ICD-10-CM | POA: Diagnosis not present

## 2015-04-26 DIAGNOSIS — R29898 Other symptoms and signs involving the musculoskeletal system: Secondary | ICD-10-CM

## 2015-04-26 DIAGNOSIS — M6289 Other specified disorders of muscle: Secondary | ICD-10-CM | POA: Diagnosis not present

## 2015-04-26 DIAGNOSIS — R609 Edema, unspecified: Secondary | ICD-10-CM
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
## 2015-04-26 NOTE — Patient Instructions (Signed)
AROM: Wrist Extension   .  With __left __ palm down, bend wrist up. Hold a 1 lbs weight Repeat __15__ times per set.  Do __1-2__ sessions per day.    AROM: Wrist Flexion   With___left __ palm up, bend wrist up. With a 1 lbs weight Repeat __15__ times per set.  Do _1-2__ sessions per day.     Copyright  VHI. All rights reserved.

## 2015-04-30 ENCOUNTER — Encounter: Payer: Self-pay | Admitting: Occupational Therapy

## 2015-04-30 ENCOUNTER — Ambulatory Visit: Payer: Medicare Other | Admitting: Occupational Therapy

## 2015-04-30 DIAGNOSIS — R29898 Other symptoms and signs involving the musculoskeletal system: Secondary | ICD-10-CM

## 2015-04-30 DIAGNOSIS — R609 Edema, unspecified: Secondary | ICD-10-CM | POA: Diagnosis not present

## 2015-04-30 DIAGNOSIS — M25632 Stiffness of left wrist, not elsewhere classified: Secondary | ICD-10-CM

## 2015-04-30 DIAGNOSIS — M6289 Other specified disorders of muscle: Secondary | ICD-10-CM | POA: Diagnosis not present
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
## 2015-05-02 DIAGNOSIS — E1165 Type 2 diabetes mellitus with hyperglycemia: Secondary | ICD-10-CM | POA: Diagnosis not present

## 2015-05-02 DIAGNOSIS — E780 Pure hypercholesterolemia: Secondary | ICD-10-CM | POA: Diagnosis not present

## 2015-05-03 ENCOUNTER — Ambulatory Visit: Payer: Medicare Other | Admitting: Occupational Therapy

## 2015-05-03 DIAGNOSIS — M6289 Other specified disorders of muscle: Secondary | ICD-10-CM | POA: Diagnosis not present

## 2015-05-03 DIAGNOSIS — M25632 Stiffness of left wrist, not elsewhere classified: Secondary | ICD-10-CM

## 2015-05-03 DIAGNOSIS — R609 Edema, unspecified: Secondary | ICD-10-CM | POA: Diagnosis not present

## 2015-05-03 DIAGNOSIS — R29898 Other symptoms and signs involving the musculoskeletal system: Secondary | ICD-10-CM
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
## 2015-05-06 ENCOUNTER — Ambulatory Visit: Payer: Medicare Other | Attending: Surgical | Admitting: Occupational Therapy

## 2015-05-06 DIAGNOSIS — M6289 Other specified disorders of muscle: Secondary | ICD-10-CM | POA: Diagnosis not present

## 2015-05-06 DIAGNOSIS — R29898 Other symptoms and signs involving the musculoskeletal system: Secondary | ICD-10-CM

## 2015-05-06 DIAGNOSIS — M25632 Stiffness of left wrist, not elsewhere classified: Secondary | ICD-10-CM | POA: Diagnosis not present
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
## 2015-05-06 NOTE — Patient Instructions (Signed)
   Wear dinosaur splint for 38min/ 3 times day.  Gradually increase tension on straps as tolerated.

## 2015-05-09 ENCOUNTER — Encounter: Payer: Medicare Other | Admitting: Occupational Therapy

## 2015-05-09 DIAGNOSIS — I10 Essential (primary) hypertension: Secondary | ICD-10-CM | POA: Diagnosis not present

## 2015-05-09 DIAGNOSIS — E7800 Pure hypercholesterolemia, unspecified: Secondary | ICD-10-CM | POA: Diagnosis not present

## 2015-05-09 DIAGNOSIS — E1165 Type 2 diabetes mellitus with hyperglycemia: Secondary | ICD-10-CM | POA: Diagnosis not present

## 2015-05-14 ENCOUNTER — Ambulatory Visit: Payer: Medicare Other | Admitting: Occupational Therapy

## 2015-05-14 DIAGNOSIS — M6289 Other specified disorders of muscle: Secondary | ICD-10-CM | POA: Diagnosis not present

## 2015-05-14 DIAGNOSIS — R29898 Other symptoms and signs involving the musculoskeletal system: Secondary | ICD-10-CM

## 2015-05-14 DIAGNOSIS — M25632 Stiffness of left wrist, not elsewhere classified: Secondary | ICD-10-CM
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
## 2015-05-16 ENCOUNTER — Ambulatory Visit: Payer: Medicare Other | Admitting: Occupational Therapy

## 2015-05-16 DIAGNOSIS — M25632 Stiffness of left wrist, not elsewhere classified: Secondary | ICD-10-CM | POA: Diagnosis not present

## 2015-05-16 DIAGNOSIS — R29898 Other symptoms and signs involving the musculoskeletal system: Secondary | ICD-10-CM

## 2015-05-16 DIAGNOSIS — M6289 Other specified disorders of muscle: Secondary | ICD-10-CM | POA: Diagnosis not present
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
## 2015-05-20 ENCOUNTER — Ambulatory Visit: Payer: Medicare Other | Admitting: Occupational Therapy

## 2015-05-20 DIAGNOSIS — M25632 Stiffness of left wrist, not elsewhere classified: Secondary | ICD-10-CM | POA: Diagnosis not present

## 2015-05-20 DIAGNOSIS — M6289 Other specified disorders of muscle: Secondary | ICD-10-CM | POA: Diagnosis not present

## 2015-05-20 DIAGNOSIS — R29898 Other symptoms and signs involving the musculoskeletal system: Secondary | ICD-10-CM
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
## 2015-05-23 ENCOUNTER — Ambulatory Visit: Payer: Medicare Other | Admitting: Occupational Therapy

## 2015-05-23 DIAGNOSIS — M25632 Stiffness of left wrist, not elsewhere classified: Secondary | ICD-10-CM

## 2015-05-23 DIAGNOSIS — M6289 Other specified disorders of muscle: Secondary | ICD-10-CM | POA: Diagnosis not present

## 2015-05-23 DIAGNOSIS — R29898 Other symptoms and signs involving the musculoskeletal system: Secondary | ICD-10-CM
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
## 2015-05-27 ENCOUNTER — Ambulatory Visit: Payer: Medicare Other | Admitting: Occupational Therapy

## 2015-05-27 DIAGNOSIS — M6289 Other specified disorders of muscle: Secondary | ICD-10-CM | POA: Diagnosis not present

## 2015-05-27 DIAGNOSIS — M25632 Stiffness of left wrist, not elsewhere classified: Secondary | ICD-10-CM

## 2015-05-27 DIAGNOSIS — R29898 Other symptoms and signs involving the musculoskeletal system: Secondary | ICD-10-CM
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
## 2015-06-04 ENCOUNTER — Ambulatory Visit: Payer: Medicare Other | Attending: Surgical | Admitting: Occupational Therapy

## 2015-06-04 DIAGNOSIS — R600 Localized edema: Secondary | ICD-10-CM | POA: Insufficient documentation

## 2015-06-04 DIAGNOSIS — M6289 Other specified disorders of muscle: Secondary | ICD-10-CM | POA: Diagnosis not present

## 2015-06-04 DIAGNOSIS — M25632 Stiffness of left wrist, not elsewhere classified: Secondary | ICD-10-CM | POA: Insufficient documentation

## 2015-06-04 DIAGNOSIS — R29898 Other symptoms and signs involving the musculoskeletal system: Secondary | ICD-10-CM
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
## 2015-06-06 ENCOUNTER — Ambulatory Visit: Payer: Medicare Other | Admitting: Occupational Therapy

## 2015-06-06 DIAGNOSIS — R600 Localized edema: Secondary | ICD-10-CM | POA: Diagnosis not present

## 2015-06-06 DIAGNOSIS — M25632 Stiffness of left wrist, not elsewhere classified: Secondary | ICD-10-CM

## 2015-06-06 DIAGNOSIS — M6289 Other specified disorders of muscle: Secondary | ICD-10-CM | POA: Diagnosis not present

## 2015-06-06 DIAGNOSIS — R29898 Other symptoms and signs involving the musculoskeletal system: Secondary | ICD-10-CM

## 2015-06-06 NOTE — Therapy (Signed)
[facility] [address] [place], [place], [address] Phone: [phone]   Fax:  [phone]  Occupational Therapy Treatment  Patient Details  Name: Renee Bradley MRN: [id number] Date of Birth: 09-01-1945 No Data Recorded  Encounter Date: 06/06/2015    Past Medical History  Diagnosis Date  . Asthma   . Hypertension   . Diabetes mellitus without complication   . Hypercholesterolemia   . Chest pressure 07/13/2012  . SOB (shortness of breath) 07/13/2012  . DM (diabetes mellitus) 07/13/2012  . HTN (hypertension) 07/13/2012  . Tachycardia, unspecified, "fluttering" 07/13/2012    Past Surgical History  Procedure Laterality Date  . Abdominal hysterectomy      There were no vitals filed for this visit.  Visit Diagnosis:  Stiffness of left wrist joint  Weakness of left hand       Treatment : Fluidotherapy x 9 mins 95* no adverse reactions for stiffness and desensitization.Ultrasound to volar wrist 73mz, 0.8w/cm2, 20% x 8 mins to volar wrist for scar mobilization. Pt performed self ROM payer stretch for wrist extension x 5 reps followed by A/ROM wrist flexion and extension with 2 lbs weight palm down, and 1 lbs wrist up. Scrubbing tabletop with towel for increased weight bearing and wrist extension, followed by arm bike x 5 mins level for reciprocal movements and wrist extension. Pt to bring dinosaur next visit for review of application. Pt agrees  to try estim next week.                         OT Short Term Goals - 05/23/15 1513    OT SHORT TERM GOAL #1   Title Pt will be independent with initial ROM HEP.--check STGs 04/24/15   Time 4   Period Weeks   Status Achieved   OT SHORT TERM GOAL #2   Title Pt will demo at least 50 degrees wrist extension for ADLs.--check 06/23/15   Baseline 35*-04/23/15   Time 4   Period Weeks   Status On-going  Not met 05/14/15  40*, 05/20/15 45* after  stretching   OT SHORT TERM GOAL #3   Title Pt will report ability to use LUE as nondominant assist at least 75% of the time..--check 06/23/15   Baseline 50%-04/23/15   Time 4   Period Weeks   Status On-going  Not met 05/20/15 50%   OT SHORT TERM GOAL #4   Title Pt will demo at least 15lbs L grip strength to assist with ADLs/opening containers..--check 06/23/15   Time 4   Period Weeks   Status New           OT Long Term Goals - 05/23/15 1505    OT LONG TERM GOAL #1   Title Pt will be independent with updated strengthening HEP.--check LTGs 05/24/15   Time 8   Period Weeks   Status Achieved  05/20/15   OT LONG TERM GOAL #2   Title Pt will demo at least 55* L wrist extension for ADLs.--check 07/22/15   Baseline 30*   Time 8   Period Weeks   Status Revised  Not met 05/20/15 45* after stretching (revised for renewal period)   OT LONG TERM GOAL #3   Title Pt will demo at least 20lbs L grip strength for ADLs/opening containers.check 07/22/15   Baseline not tested due to precautions   Time 8   Period Weeks   Status Revised  05/14/15  7lbs; 05/23/15  not met 9lbs (revised for renewal)   OT LONG TERM GOAL #4   Title Pt will report using LUE as nondominant assist at least 90% of the time.--check 07/22/15   Time 8   Period Weeks   Status Revised  Not met 05/20/15 50% (revised for renewal)   OT LONG TERM GOAL #5   Title Pt will improve LUE functional use as shown by improving Quick DASH score to 35% or less.--check 07/22/15   Baseline 47.5% 05/23/15   Time 8   Period Weeks   Status New               Plan - 06/06/15 1607    Clinical Impression Statement Pt is progressing towards goals for A/ROM and LUE functional use    Pt will benefit from skilled therapeutic intervention in order to improve on the following deficits (Retired) Decreased strength;Pain;Impaired UE functional use;Decreased skin integrity;Increased edema;Decreased coordination;Decreased range of motion    Rehab Potential Good   Clinical Impairments Affecting Rehab Potential severity   OT Frequency 2x / week   OT Duration 8 weeks   Plan wrist extension, modalities   OT Home Exercise Plan Education provided: wrist ext AROM; 04/09/15: yellow putty, wrist PROM, tendon glides HEP, red putty HEP   Recommended Other Services cleared for  ultrasound, estim and fluidotherapy   Consulted and Agree with Plan of Care Patient        Problem List Patient Active Problem List   Diagnosis Date Noted  . Chest pressure 07/13/2012  . SOB (shortness of breath) 07/13/2012  . DM (diabetes mellitus) ([place]) 07/13/2012  . HTN (hypertension) 07/13/2012  . Hyperlipidemia 07/13/2012  . Tachycardia, unspecified, "fluttering" 07/13/2012    RINE,KATHRYN 06/06/2015, 4:12 PM Theone Murdoch, OTR/L [phone] Phone: [phone] 4:12 PM 06/06/2015 [facility] [address] [place], [place], [address] Phone: [phone]   Fax:  [phone]  Name: Renee Bradley MRN: [id number] Date of Birth: Dec 08, 1945

## 2015-06-11 ENCOUNTER — Ambulatory Visit: Payer: Medicare Other | Admitting: Occupational Therapy

## 2015-06-11 DIAGNOSIS — M25632 Stiffness of left wrist, not elsewhere classified: Secondary | ICD-10-CM | POA: Diagnosis not present

## 2015-06-11 DIAGNOSIS — M6289 Other specified disorders of muscle: Secondary | ICD-10-CM | POA: Diagnosis not present

## 2015-06-11 DIAGNOSIS — R29898 Other symptoms and signs involving the musculoskeletal system: Secondary | ICD-10-CM

## 2015-06-11 DIAGNOSIS — R600 Localized edema: Secondary | ICD-10-CM | POA: Diagnosis not present

## 2015-06-11 NOTE — Therapy (Signed)
[facility] [address] [place], [place], [address] Phone: [phone]   Fax:  [phone]  Occupational Therapy Treatment  Patient Details  Name: Renee Bradley MRN: [id number] Date of Birth: [birth year] No Data Recorded  Encounter Date: 06/11/2015      OT End of Session - 06/11/15 1545    Visit Number 16   Number of Visits 29   Date for OT Re-Evaluation 07/22/15   Authorization Type Medicare, BCBS--G code needed   Authorization Time Period renewal completed 05/23/15, 3/8   Authorization - Visit Number 16   Authorization - Number of Visits 20   OT Start Time 1542   OT Stop Time 1620   OT Time Calculation (min) 38 min   Activity Tolerance Patient tolerated treatment well   Behavior During Therapy [facility] for tasks assessed/performed      Past Medical History  Diagnosis Date  . Asthma   . Hypertension   . Diabetes mellitus without complication   . Hypercholesterolemia   . Chest pressure 07/13/2012  . SOB (shortness of breath) 07/13/2012  . DM (diabetes mellitus) 07/13/2012  . HTN (hypertension) 07/13/2012  . Tachycardia, unspecified, "fluttering" 07/13/2012    Past Surgical History  Procedure Laterality Date  . Abdominal hysterectomy      There were no vitals filed for this visit.  Visit Diagnosis:  Stiffness of left wrist joint  Weakness of left hand                    OT Treatments/Exercises (OP) - 06/11/15 0001    Wrist Exercises   Other wrist exercises Prayer stretch for wrist extension   Hand Exercises   Other Hand Exercises Picking up blocks using 15lbs sustained grip strength for incr strength with min-mod difficulty   Modalities   Modalities Ultrasound;Insurance risk surveyor Action wrist extensors   Electrical Stimulation Parameters 50pps, 250 pulse width, 10sec cycle,  intensity=11, ramp 2.5sec with no adverse reactions x3mn   Electrical Stimulation Goals Strength  ROM   Ultrasound   Ultrasound Location volar forearm   Ultrasound Parameters 343m, 20% pulsed, 0.8wts/cm2 x8m75mwith no adverse reactions    Ultrasound Goals --  scar tissues   Splinting   Splinting Reviewed donning/doffing full composite extension splint and added notes to splint to assist pt in donning                OT Education - 06/11/15 1629    Education Details Encouraged pt to incr LUE functional use and stop wearing stockinette on L arm   Person(s) Educated Patient   Methods Explanation   Comprehension Verbalized understanding          OT Short Term Goals - 05/23/15 1513    OT SHORT TERM GOAL #1   Title Pt will be independent with initial ROM HEP.--check STGs 04/24/15   Time 4   Period Weeks   Status Achieved   OT SHORT TERM GOAL #2   Title Pt will demo at least 50 degrees wrist extension for ADLs.--check 06/23/15   Baseline 35*-04/23/15   Time 4   Period Weeks   Status On-going  Not met 05/14/15  40*, 05/20/15 45* after stretching   OT SHORT TERM GOAL #3   Title Pt will report ability to use LUE as nondominant assist at least 75% of the time..--check 06/23/15   Baseline 50%-04/23/15   Time  4   Period Weeks   Status On-going  Not met 05/20/15 50%   OT SHORT TERM GOAL #4   Title Pt will demo at least 15lbs L grip strength to assist with ADLs/opening containers..--check 06/23/15   Time 4   Period Weeks   Status New           OT Long Term Goals - 05/23/15 1505    OT LONG TERM GOAL #1   Title Pt will be independent with updated strengthening HEP.--check LTGs 05/24/15   Time 8   Period Weeks   Status Achieved  05/20/15   OT LONG TERM GOAL #2   Title Pt will demo at least 55* L wrist extension for ADLs.--check 07/22/15   Baseline 30*   Time 8   Period Weeks   Status Revised  Not met 05/20/15 45* after stretching (revised for renewal period)    OT LONG TERM GOAL #3   Title Pt will demo at least 20lbs L grip strength for ADLs/opening containers.check 07/22/15   Baseline not tested due to precautions   Time 8   Period Weeks   Status Revised  05/14/15  7lbs; 05/23/15  not met 9lbs (revised for renewal)   OT LONG TERM GOAL #4   Title Pt will report using LUE as nondominant assist at least 90% of the time.--check 07/22/15   Time 8   Period Weeks   Status Revised  Not met 05/20/15 50% (revised for renewal)   OT LONG TERM GOAL #5   Title Pt will improve LUE functional use as shown by improving Quick DASH score to 35% or less.--check 07/22/15   Baseline 47.5% 05/23/15   Time 8   Period Weeks   Status New               Plan - 06/11/15 1607    Clinical Impression Statement Pt tolerating modalities well.  (pt arrived late today)   Plan ultrasound to L dorsal wrist, wrist extension, grip strength   OT Home Exercise Plan Education provided: wrist ext AROM; 04/09/15: yellow putty, wrist PROM, tendon glides HEP, red putty HEP        Problem List Patient Active Problem List   Diagnosis Date Noted  . Chest pressure 07/13/2012  . SOB (shortness of breath) 07/13/2012  . DM (diabetes mellitus) ([place]) 07/13/2012  . HTN (hypertension) 07/13/2012  . Hyperlipidemia 07/13/2012  . Tachycardia, unspecified, "fluttering" 07/13/2012    [facility] 06/11/2015, 4:36 PM  [facility] [address] [place], [place], [address] Phone: [phone]   Fax:  [phone]  Name: Renee Bradley MRN: [id number] Date of Birth: April 23, 1946  Vianne Bulls, OTR/L 06/11/2015 4:37 PM

## 2015-06-13 ENCOUNTER — Ambulatory Visit: Payer: Medicare Other | Admitting: Occupational Therapy

## 2015-06-13 DIAGNOSIS — R29898 Other symptoms and signs involving the musculoskeletal system: Secondary | ICD-10-CM

## 2015-06-13 DIAGNOSIS — M25632 Stiffness of left wrist, not elsewhere classified: Secondary | ICD-10-CM

## 2015-06-13 DIAGNOSIS — R600 Localized edema: Secondary | ICD-10-CM | POA: Diagnosis not present

## 2015-06-13 DIAGNOSIS — M6289 Other specified disorders of muscle: Secondary | ICD-10-CM | POA: Diagnosis not present
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
## 2015-06-18 ENCOUNTER — Ambulatory Visit: Payer: Medicare Other | Admitting: Occupational Therapy

## 2015-06-18 DIAGNOSIS — M25632 Stiffness of left wrist, not elsewhere classified: Secondary | ICD-10-CM | POA: Diagnosis not present

## 2015-06-18 DIAGNOSIS — R600 Localized edema: Secondary | ICD-10-CM

## 2015-06-18 DIAGNOSIS — R29898 Other symptoms and signs involving the musculoskeletal system: Secondary | ICD-10-CM

## 2015-06-18 DIAGNOSIS — M6289 Other specified disorders of muscle: Secondary | ICD-10-CM | POA: Diagnosis not present
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
## 2015-06-19 DIAGNOSIS — I129 Hypertensive chronic kidney disease with stage 1 through stage 4 chronic kidney disease, or unspecified chronic kidney disease: Secondary | ICD-10-CM | POA: Diagnosis not present

## 2015-06-19 DIAGNOSIS — N182 Chronic kidney disease, stage 2 (mild): Secondary | ICD-10-CM | POA: Diagnosis not present

## 2015-06-19 DIAGNOSIS — E1122 Type 2 diabetes mellitus with diabetic chronic kidney disease: Secondary | ICD-10-CM | POA: Diagnosis not present

## 2015-06-19 DIAGNOSIS — N08 Glomerular disorders in diseases classified elsewhere: Secondary | ICD-10-CM | POA: Diagnosis not present

## 2015-06-20 ENCOUNTER — Ambulatory Visit: Payer: Medicare Other | Admitting: Occupational Therapy

## 2015-06-20 DIAGNOSIS — R29898 Other symptoms and signs involving the musculoskeletal system: Secondary | ICD-10-CM

## 2015-06-20 DIAGNOSIS — R600 Localized edema: Secondary | ICD-10-CM | POA: Diagnosis not present

## 2015-06-20 DIAGNOSIS — M25632 Stiffness of left wrist, not elsewhere classified: Secondary | ICD-10-CM | POA: Diagnosis not present

## 2015-06-20 DIAGNOSIS — M6289 Other specified disorders of muscle: Secondary | ICD-10-CM | POA: Diagnosis not present
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
## 2015-06-25 ENCOUNTER — Ambulatory Visit: Payer: Medicare Other | Admitting: Occupational Therapy

## 2015-06-25 DIAGNOSIS — R600 Localized edema: Secondary | ICD-10-CM | POA: Diagnosis not present

## 2015-06-25 DIAGNOSIS — M25632 Stiffness of left wrist, not elsewhere classified: Secondary | ICD-10-CM | POA: Diagnosis not present

## 2015-06-25 DIAGNOSIS — R29898 Other symptoms and signs involving the musculoskeletal system: Secondary | ICD-10-CM

## 2015-06-25 DIAGNOSIS — M6289 Other specified disorders of muscle: Secondary | ICD-10-CM | POA: Diagnosis not present
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
## 2015-07-09 ENCOUNTER — Encounter: Payer: Medicare Other | Admitting: Occupational Therapy

## 2015-07-11 ENCOUNTER — Ambulatory Visit: Payer: Medicare Other | Attending: Surgical | Admitting: Occupational Therapy

## 2015-07-11 DIAGNOSIS — M6289 Other specified disorders of muscle: Secondary | ICD-10-CM | POA: Diagnosis not present

## 2015-07-11 DIAGNOSIS — R600 Localized edema: Secondary | ICD-10-CM | POA: Diagnosis not present

## 2015-07-11 DIAGNOSIS — M25632 Stiffness of left wrist, not elsewhere classified: Secondary | ICD-10-CM | POA: Insufficient documentation

## 2015-07-11 DIAGNOSIS — R29898 Other symptoms and signs involving the musculoskeletal system: Secondary | ICD-10-CM
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
## 2015-07-16 ENCOUNTER — Ambulatory Visit: Payer: Medicare Other | Admitting: Occupational Therapy

## 2015-07-16 ENCOUNTER — Encounter: Payer: Self-pay | Admitting: Occupational Therapy

## 2015-07-16 DIAGNOSIS — M6289 Other specified disorders of muscle: Secondary | ICD-10-CM | POA: Diagnosis not present

## 2015-07-16 DIAGNOSIS — M25632 Stiffness of left wrist, not elsewhere classified: Secondary | ICD-10-CM | POA: Diagnosis not present

## 2015-07-16 DIAGNOSIS — R600 Localized edema: Secondary | ICD-10-CM | POA: Diagnosis not present

## 2015-07-16 DIAGNOSIS — R29898 Other symptoms and signs involving the musculoskeletal system: Secondary | ICD-10-CM
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
## 2015-07-18 ENCOUNTER — Ambulatory Visit: Payer: Medicare Other | Admitting: Occupational Therapy

## 2015-07-18 DIAGNOSIS — M6289 Other specified disorders of muscle: Secondary | ICD-10-CM | POA: Diagnosis not present

## 2015-07-18 DIAGNOSIS — M25632 Stiffness of left wrist, not elsewhere classified: Secondary | ICD-10-CM | POA: Diagnosis not present

## 2015-07-18 DIAGNOSIS — R29898 Other symptoms and signs involving the musculoskeletal system: Secondary | ICD-10-CM

## 2015-07-18 DIAGNOSIS — R600 Localized edema: Secondary | ICD-10-CM
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
## 2015-07-22 DIAGNOSIS — Z87828 Personal history of other (healed) physical injury and trauma: Secondary | ICD-10-CM | POA: Diagnosis not present

## 2015-07-22 DIAGNOSIS — Z09 Encounter for follow-up examination after completed treatment for conditions other than malignant neoplasm: Secondary | ICD-10-CM | POA: Diagnosis not present

## 2015-07-22 DIAGNOSIS — T310 Burns involving less than 10% of body surface: Secondary | ICD-10-CM | POA: Diagnosis not present

## 2015-09-26 ENCOUNTER — Encounter: Payer: Self-pay | Admitting: Physician Assistant

## 2015-09-26 ENCOUNTER — Other Ambulatory Visit: Payer: Self-pay | Admitting: Physician Assistant

## 2015-09-26 ENCOUNTER — Ambulatory Visit (INDEPENDENT_AMBULATORY_CARE_PROVIDER_SITE_OTHER): Payer: Medicare Other | Admitting: Physician Assistant

## 2015-09-26 VITALS — BP 120/72 | HR 52 | Ht 63.0 in | Wt 173.0 lb

## 2015-09-26 DIAGNOSIS — M79604 Pain in right leg: Secondary | ICD-10-CM | POA: Diagnosis not present

## 2015-09-26 DIAGNOSIS — T502X5A Adverse effect of carbonic-anhydrase inhibitors, benzothiadiazides and other diuretics, initial encounter: Principal | ICD-10-CM

## 2015-09-26 DIAGNOSIS — M79605 Pain in left leg: Secondary | ICD-10-CM | POA: Diagnosis not present

## 2015-09-26 DIAGNOSIS — I10 Essential (primary) hypertension: Secondary | ICD-10-CM

## 2015-09-26 DIAGNOSIS — E876 Hypokalemia: Secondary | ICD-10-CM

## 2015-09-26 NOTE — Progress Notes (Signed)
Cardiology Office Note   Date:  09/26/2015   ID:  Renee Bradley, DOB 1945-11-13, MRN [id number]  PCP:  [name], MD  Cardiologist:  Dr. Allyson Sabal    Chief Complaint  Patient presents with  . Follow-up    seen for Dr. Allyson Sabal      History of Present Illness: Renee Bradley is a 70 y.o. female who presents for evaluation of leg swelling. She has past medical history of hypertension, hyperlipidemia and DM. She had an intermediate nuclear stress test on 08/10/2012 showing decreased uptake inferolaterally towards the apex consistent with ischemia. Echocardiogram obtained on the same day showed EF 55-60%, grade 1 diastolic dysfunction, mild TR, PA peak pressure 38. Despite having abnormal Myoview, however given the asymptomatic nature, she did not undergo further ischemic evaluation with invasive workup.  She presents today for burning sensation and tingling of the lower extremity that occurred in the last few days. It started on the medial side of the right leg, eventually involved the left leg as well. It seems that occur both at rest and with exertion. She denies any shortness of breath or chest pain. She is not sure what is causing her symptom. She is concerned about blood clots in the legs. Her right lower extremity does have diminished pulse, however left lower extremity has very good dorsalis pedis pulse. She also has significant lower extremity varicose veins during interview.    Past Medical History  Diagnosis Date  . Asthma   . Hypertension   . Diabetes mellitus without complication ([facility])   . Hypercholesterolemia   . Chest pressure 07/13/2012  . SOB (shortness of breath) 07/13/2012  . DM (diabetes mellitus) ([facility]) 07/13/2012  . HTN (hypertension) 07/13/2012  . Tachycardia, unspecified, "fluttering" 07/13/2012    Past Surgical History  Procedure Laterality Date  . Abdominal hysterectomy       Current Outpatient Prescriptions  Medication Sig Dispense Refill  .  aliskiren (TEKTURNA) 300 MG tablet Take 300 mg by mouth at bedtime.     Marland Kitchen aspirin EC 81 MG tablet Take 81 mg by mouth daily.    Marland Kitchen atenolol (TENORMIN) 50 MG tablet Take 100 mg by mouth daily.    Marland Kitchen atorvastatin (LIPITOR) 20 MG tablet Take 20 mg by mouth at bedtime.     . furosemide (LASIX) 20 MG tablet Take 20 mg by mouth at bedtime.      No current facility-administered medications for this visit.    Allergies:   Janumet; Januvia; Kombiglyze; and Sulfur    Social History:  The patient  reports that she quit smoking about 32 years ago. She has never used smokeless tobacco. She reports that she does not drink alcohol or use illicit drugs.   Family History:  The patient's family history includes Alzheimer's disease in her father; Diabetes type II in her brother and mother; Heart attack in her mother; Stroke in her mother.    ROS:  Please see the history of present illness.   Otherwise, review of systems are positive for burning sensation in the lower extremity along with pen needles sensation.   All other systems are reviewed and negative.    PHYSICAL EXAM: VS:  BP 120/72 mmHg  Pulse 52  Ht 5\' 3"  (1.6 m)  Wt 173 lb (78.472 kg)  BMI 30.65 kg/m2 , BMI Body mass index is 30.65 kg/(m^2). GEN: Well nourished, well developed, in no acute distress HEENT: normal Neck: no JVD, carotid bruits, or masses Cardiac: RRR; no  murmurs, rubs, or gallops,no edema  2+ left dorsalis pedis pulse, only 1+ right dorsalis pedis pulse. Respiratory:  clear to auscultation bilaterally, normal work of breathing GI: soft, nontender, nondistended, + BS MS: no deformity or atrophy Skin: warm and dry, no rash. Significant varicose pain in bilateral lower extremity Neuro:  Strength and sensation are intact Psych: euthymic mood, full affect   EKG:  EKG is ordered today. The ekg ordered today demonstrates normal sinus rhythm, poor progression anteriorly, Q wave inversion in V1 and V2 unchanged compared to the  previous EKG   Recent Labs: 09/29/2014: B Natriuretic Peptide 131.8*; BUN 8; Creatinine, Ser 0.89; Hemoglobin 13.3; Platelets 252; Potassium 3.7; Sodium 135    Lipid Panel    Component Value Date/Time   CHOL 163 02/27/2014 1505   TRIG 103 02/27/2014 1505   HDL 54 02/27/2014 1505   CHOLHDL 3.0 02/27/2014 1505   VLDL 21 02/27/2014 1505   LDLCALC 88 02/27/2014 1505      Wt Readings from Last 3 Encounters:  09/26/15 173 lb (78.472 kg)  10/30/14 186 lb 12.8 oz (84.732 kg)  02/27/14 197 lb 6.4 oz (89.54 kg)      Other studies Reviewed: Additional studies/ records that were reviewed today include:   Previous office visit note  Review of the above records demonstrates:   Patient with hypertension, hyperlipidemia and diabetes who had abnormal stress test in January 2014 came in for evaluation of lower extremity bilateral burning sensation started in the recent week.  ASSESSMENT AND PLAN:  1.  Burning sensation in bilateral lower extremity: Top differential diagnosis is varicose veins versus DVT (which is less likely) versus hypokalemia (also less likely) vs diabetic neuropathy  - I think the most likely explanation is varicose veins, I have instructed her to do conservative treatment including raise the leg during sleep and ambulating as tolerated. If conservative management fails, she will need to discuss with her PCP regarding potential referral to a vein specialist  - Will obtain BMET to rule out hypokalemia and venous ultrasound to rule out DVT. Despite having decreased dorsalis pedis pulse on the right lower extremity, she has great pulses in the left lower extremity, her symptom is unrelated to exertion, I do not think this represent arterial insufficiency, therefore I did not obtain ABI at this time.  2. H/o abnormal nuclear stress test Jan 2014: Given lack of chest discomfort, would not pursue any ischemic workup at this time.  3. HTN: BP well controlled on atenolol, does  have some degree of bradycardia in the high 50s, however asymptomatic  Current medicines are reviewed at length with the patient today.  The patient does not have concerns regarding medicines.  The following changes have been made:  no change  Labs/ tests ordered today include:   Orders Placed This Encounter  Procedures  . EKG 12-Lead     Disposition:   FU with Dr. Allyson Sabal in 3 months  Signed, Azalee Course, [place]  09/26/2015 6:55 PM    [facility] [address] [place], [place], [place]  [address] Phone: [phone]; Fax: [phone]

## 2015-09-26 NOTE — Patient Instructions (Signed)
Medication Instructions:  None  Labwork: None  Testing/Procedures: Your physician has requested that you have a lower extremity venous duplex. This test is an ultrasound of the veins in the legs or arms. It looks at venous blood flow that carries blood from the heart to the legs or arms. Allow one hour for a Lower Venous exam. Allow thirty minutes for an Upper Venous exam. There are no restrictions or special instructions.    Follow-Up: Your physician recommends that you schedule a follow-up appointment in: 3-6 months with Dr. Allyson Sabal.   Any Other Special Instructions Will Be Listed Below (If Applicable).     If you need a refill on your cardiac medications before your next appointment, please call your pharmacy.

## 2015-09-27 ENCOUNTER — Telehealth: Payer: Self-pay | Admitting: *Deleted

## 2015-09-27 NOTE — Telephone Encounter (Signed)
Per Azalee Course, PA-C, I contacted the pt to let her know that he wanted her to have a BMET drawn on 09/30/15 when she goes and gets her le venous doppler. Pt verbalized understanding.

## 2015-09-27 NOTE — Telephone Encounter (Signed)
-----   Message from [place], [place] sent at 09/26/2015  6:44 PM EST ----- Regarding: Obtain extra lab Please ask patient to obtain a BMET on the day she go for the lower extremity venous ultrasound to see her potassium level while taking lasix to make sure she is not hypokalemic which can cause leg cramps. Order placed  Signed, Azalee Course PA Pager: [phone]

## 2015-09-30 ENCOUNTER — Ambulatory Visit (HOSPITAL_COMMUNITY)
Admission: RE | Admit: 2015-09-30 | Discharge: 2015-09-30 | Disposition: A | Discharge status: Home / Self Care | Payer: Medicare Other | Source: Ambulatory Visit | Attending: Cardiology | Admitting: Cardiology

## 2015-09-30 ENCOUNTER — Other Ambulatory Visit (INDEPENDENT_AMBULATORY_CARE_PROVIDER_SITE_OTHER): Payer: Medicare Other | Admitting: *Deleted

## 2015-09-30 ENCOUNTER — Ambulatory Visit: Payer: Medicare Other | Admitting: Physician Assistant

## 2015-09-30 DIAGNOSIS — E7800 Pure hypercholesterolemia, unspecified: Secondary | ICD-10-CM | POA: Insufficient documentation

## 2015-09-30 DIAGNOSIS — E876 Hypokalemia: Secondary | ICD-10-CM | POA: Diagnosis not present

## 2015-09-30 DIAGNOSIS — M79604 Pain in right leg: Secondary | ICD-10-CM | POA: Insufficient documentation

## 2015-09-30 DIAGNOSIS — M79605 Pain in left leg: Secondary | ICD-10-CM | POA: Insufficient documentation

## 2015-09-30 DIAGNOSIS — E119 Type 2 diabetes mellitus without complications: Secondary | ICD-10-CM | POA: Insufficient documentation

## 2015-09-30 DIAGNOSIS — M79606 Pain in leg, unspecified: Secondary | ICD-10-CM | POA: Diagnosis not present

## 2015-09-30 DIAGNOSIS — I10 Essential (primary) hypertension: Secondary | ICD-10-CM | POA: Insufficient documentation

## 2015-09-30 DIAGNOSIS — T502X5A Adverse effect of carbonic-anhydrase inhibitors, benzothiadiazides and other diuretics, initial encounter: Secondary | ICD-10-CM

## 2015-10-01 LAB — BASIC METABOLIC PANEL
BUN: 18 mg/dL (ref 7–25)
CALCIUM: 9.1 mg/dL (ref 8.6–10.4)
CHLORIDE: 108 mmol/L (ref 98–110)
CO2: 25 mmol/L (ref 20–31)
CREATININE: 0.86 mg/dL (ref 0.50–0.99)
Glucose, Bld: 83 mg/dL (ref 65–99)
Potassium: 4.2 mmol/L (ref 3.5–5.3)
Sodium: 144 mmol/L (ref 135–146)

## 2015-10-21 DIAGNOSIS — E1122 Type 2 diabetes mellitus with diabetic chronic kidney disease: Secondary | ICD-10-CM | POA: Diagnosis not present

## 2015-10-21 DIAGNOSIS — I129 Hypertensive chronic kidney disease with stage 1 through stage 4 chronic kidney disease, or unspecified chronic kidney disease: Secondary | ICD-10-CM | POA: Diagnosis not present

## 2015-10-21 DIAGNOSIS — Z0000 Encounter for general adult medical examination without abnormal findings: Secondary | ICD-10-CM | POA: Diagnosis not present

## 2015-10-21 DIAGNOSIS — N182 Chronic kidney disease, stage 2 (mild): Secondary | ICD-10-CM | POA: Diagnosis not present

## 2015-10-21 DIAGNOSIS — E559 Vitamin D deficiency, unspecified: Secondary | ICD-10-CM | POA: Diagnosis not present

## 2015-10-21 DIAGNOSIS — N08 Glomerular disorders in diseases classified elsewhere: Secondary | ICD-10-CM | POA: Diagnosis not present

## 2015-11-01 DIAGNOSIS — S83242A Other tear of medial meniscus, current injury, left knee, initial encounter: Secondary | ICD-10-CM | POA: Diagnosis not present

## 2015-11-01 DIAGNOSIS — M1612 Unilateral primary osteoarthritis, left hip: Secondary | ICD-10-CM | POA: Diagnosis not present

## 2015-12-10 ENCOUNTER — Other Ambulatory Visit: Payer: Self-pay

## 2015-12-10 DIAGNOSIS — Z1231 Encounter for screening mammogram for malignant neoplasm of breast: Secondary | ICD-10-CM

## 2015-12-18 ENCOUNTER — Ambulatory Visit (INDEPENDENT_AMBULATORY_CARE_PROVIDER_SITE_OTHER): Payer: Medicare Other | Admitting: Podiatry

## 2015-12-18 ENCOUNTER — Encounter: Payer: Self-pay | Admitting: Podiatry

## 2015-12-18 VITALS — BP 123/67 | HR 55 | Resp 12

## 2015-12-18 DIAGNOSIS — Q828 Other specified congenital malformations of skin: Secondary | ICD-10-CM

## 2015-12-18 DIAGNOSIS — E119 Type 2 diabetes mellitus without complications: Secondary | ICD-10-CM | POA: Diagnosis not present

## 2015-12-18 NOTE — Progress Notes (Signed)
   Subjective:    Patient ID: Renee Bradley, female    DOB: 1945/09/06, 70 y.o.   MRN: [id number]  HPI    As patient presents today with her daughter present in the treatment room complaining of a painful skin lesion on the plantar aspect of the right foot. The lesion has been there approximately 5+ years and was debrided in our office previously 3+ years ago. The plantar skin lesion the right foot is become progressively more uncomfortable over the last several months making walking uncomfortable Up to this time patient has had the area scraped  By her pedicurist  Patient states that she is a diabetic, however, has not taken any medication for diabetes for approximately a year Patient denies any history of skin ulceration, claudication or amputation  Review of Systems  Skin: Positive for color change.       Objective:   Physical Exam  Patient appears orientated 3  Vascular: DP right trace palpable DP left 2/4 PT pulses 2/4 bilaterally Capillary reflex immediate bilaterally  Neurological: Sensation to 10 g monofilament wire intact 5/5 bilaterally Vibratory sensation reactive bilaterally Ankle reflex equal and reactive bilaterally  Dermatological: No open skin lesions bilaterally Nucleated keratoses plantar third right MPJ  Musculoskeletal: Hammertoe second right There is no restriction ankle, subtalar, midtarsal joints bilaterally        Assessment & Plan:   Assessment: Satisfactory neurovascular status Diabetic without foot complications Porokeratosis 1 plantar right  Plan: Today I reviewed the results of examination with patient today and offered debridement and she verbally consents. the porokeratosis on the plantar aspect of right foot is debrided without any bleeding and packed with salinocaine. Patient instructed leave salinocaine bandage on and dry if possible 3-5 days.  Return as needed for debridement

## 2015-12-18 NOTE — Patient Instructions (Signed)
Today I trimmed out a nucleated core and light growth, porokeratosis and packed it with mild acid. If possible keep bandage on and dry 3-5 days. Return as needed for debridement  Diabetes and Foot Care Diabetes may cause you to have problems because of poor blood supply (circulation) to your feet and legs. This may cause the skin on your feet to become thinner, break easier, and heal more slowly. Your skin may become dry, and the skin may peel and crack. You may also have nerve damage in your legs and feet causing decreased feeling in them. You may not notice minor injuries to your feet that could lead to infections or more serious problems. Taking care of your feet is one of the most important things you can do for yourself.  HOME CARE INSTRUCTIONS  Wear shoes at all times, even in the house. Do not go barefoot. Bare feet are easily injured.  Check your feet daily for blisters, cuts, and redness. If you cannot see the bottom of your feet, use a mirror or ask someone for help.  Wash your feet with warm water (do not use hot water) and mild soap. Then pat your feet and the areas between your toes until they are completely dry. Do not soak your feet as this can dry your skin.  Apply a moisturizing lotion or petroleum jelly (that does not contain alcohol and is unscented) to the skin on your feet and to dry, brittle toenails. Do not apply lotion between your toes.  Trim your toenails straight across. Do not dig under them or around the cuticle. File the edges of your nails with an emery board or nail file.  Do not cut corns or calluses or try to remove them with medicine.  Wear clean socks or stockings every day. Make sure they are not too tight. Do not wear knee-high stockings since they may decrease blood flow to your legs.  Wear shoes that fit properly and have enough cushioning. To break in new shoes, wear them for just a few hours a day. This prevents you from injuring your feet. Always look  in your shoes before you put them on to be sure there are no objects inside.  Do not cross your legs. This may decrease the blood flow to your feet.  If you find a minor scrape, cut, or break in the skin on your feet, keep it and the skin around it clean and dry. These areas may be cleansed with mild soap and water. Do not cleanse the area with peroxide, alcohol, or iodine.  When you remove an adhesive bandage, be sure not to damage the skin around it.  If you have a wound, look at it several times a day to make sure it is healing.  Do not use heating pads or hot water bottles. They may burn your skin. If you have lost feeling in your feet or legs, you may not know it is happening until it is too late.  Make sure your health care provider performs a complete foot exam at least annually or more often if you have foot problems. Report any cuts, sores, or bruises to your health care provider immediately. SEEK MEDICAL CARE IF:   You have an injury that is not healing.  You have cuts or breaks in the skin.  You have an ingrown nail.  You notice redness on your legs or feet.  You feel burning or tingling in your legs or feet.  You have  pain or cramps in your legs and feet.  Your legs or feet are numb.  Your feet always feel cold. SEEK IMMEDIATE MEDICAL CARE IF:   There is increasing redness, swelling, or pain in or around a wound.  There is a red line that goes up your leg.  Pus is coming from a wound.  You develop a fever or as directed by your health care provider.  You notice a bad smell coming from an ulcer or wound.   This information is not intended to replace advice given to you by your health care provider. Make sure you discuss any questions you have with your health care provider.   Document Released: 07/17/2000 Document Revised: 03/22/2013 Document Reviewed: 12/27/2012 Elsevier Interactive Patient Education [employer].

## 2015-12-27 ENCOUNTER — Ambulatory Visit: Payer: Medicare Other

## 2015-12-31 ENCOUNTER — Encounter: Payer: Self-pay | Admitting: Cardiovascular Disease

## 2015-12-31 ENCOUNTER — Ambulatory Visit (INDEPENDENT_AMBULATORY_CARE_PROVIDER_SITE_OTHER): Payer: Medicare Other | Admitting: Cardiovascular Disease

## 2015-12-31 VITALS — BP 144/78 | HR 57 | Ht 63.0 in | Wt 179.2 lb

## 2015-12-31 DIAGNOSIS — I10 Essential (primary) hypertension: Secondary | ICD-10-CM | POA: Diagnosis not present

## 2015-12-31 DIAGNOSIS — E785 Hyperlipidemia, unspecified: Secondary | ICD-10-CM | POA: Diagnosis not present

## 2015-12-31 NOTE — Assessment & Plan Note (Signed)
History of hypertension blood pressure measured today 144/78. She is on atenolol and Tekturna. Continue current meds at current dosing

## 2015-12-31 NOTE — Progress Notes (Signed)
12/31/2015 Renee Bradley   Jun 14, 1946  [id number]  Primary Physician [name], MD Primary Cardiologist: Lorretta Harp MD Renae Gloss   HPI:  The patient is a 70 year old moderately overweight married [place] American female, mother of 2, grandmother of 2 grandchildren ho I last saw in the office 02/27/14.Marland Kitchen She was hospitalized, July 15, 2012, with shortness of breath. She had had lower extremity Dopplers performed at Dr. Roland Earl office just prior to that. CT angiogram was negative. She was ultimately discharged home. Her risk factors for ischemic heart disease include remote tobacco abuse, having quit 35 years ago, and treated diabetes, hypertension, and hyperlipidemia. There is no family history of heart disease. She has never had a heart attack or stroke. Since her admission, she denies chest pain or shortness of breath. Her surgical history is remarkable for a remote hysterectomy in 1989. She wore an event monitor, which showed sinus rhythm, and a Lexiscan Myoview showed mild inferolateral-apical ischemia. Since I saw HER-2 years ago she is rate remained currently stable. She denies chest pain or shortness of breath.    Current Outpatient Prescriptions  Medication Sig Dispense Refill  . aliskiren (TEKTURNA) 300 MG tablet Take 300 mg by mouth at bedtime.     Marland Kitchen aspirin EC 81 MG tablet Take 81 mg by mouth daily.    Marland Kitchen atenolol (TENORMIN) 50 MG tablet Take 100 mg by mouth daily.    Marland Kitchen atorvastatin (LIPITOR) 20 MG tablet Take 20 mg by mouth at bedtime.     . ergocalciferol (VITAMIN D2) 50000 units capsule Take 50,000 Units by mouth once a week.    . furosemide (LASIX) 20 MG tablet Take 20 mg by mouth at bedtime.      No current facility-administered medications for this visit.    Allergies  Allergen Reactions  . Janumet [Sitagliptin-Metformin Hcl] Shortness Of Breath  . Januvia [Sitagliptin] Shortness Of Breath  . Kombiglyze [Saxagliptin-Metformin Er]  Shortness Of Breath  . Sulfur Itching    Social History   Social History  . Marital Status: Married    Spouse Name: N/A  . Number of Children: N/A  . Years of Education: N/A   Occupational History  . Not on file.   Social History Main Topics  . Smoking status: Former Smoker    Quit date: 05/13/1983  . Smokeless tobacco: Never Used  . Alcohol Use: No  . Drug Use: No  . Sexual Activity: Not on file   Other Topics Concern  . Not on file   Social History Narrative     Review of Systems: General: negative for chills, fever, night sweats or weight changes.  Cardiovascular: negative for chest pain, dyspnea on exertion, edema, orthopnea, palpitations, paroxysmal nocturnal dyspnea or shortness of breath Dermatological: negative for rash Respiratory: negative for cough or wheezing Urologic: negative for hematuria Abdominal: negative for nausea, vomiting, diarrhea, bright red blood per rectum, melena, or hematemesis Neurologic: negative for visual changes, syncope, or dizziness All other systems reviewed and are otherwise negative except as noted above.    Blood pressure 144/78, pulse 57, height _0  (1.6 m), weight 179 lb 3.2 oz (81.285 kg), SpO2 96 %.  General appearance: alert and no distress Neck: no adenopathy, no carotid bruit, no JVD, supple, symmetrical, trachea midline and thyroid not enlarged, symmetric, no tenderness/mass/nodules Lungs: clear to auscultation bilaterally Heart: regular rate and rhythm, S1, S2 normal, no murmur, click, rub or gallop Extremities: extremities normal, atraumatic, no cyanosis or edema  EKG not performed today  ASSESSMENT AND PLAN:   HTN (hypertension) History of hypertension blood pressure measured today 144/78. She is on atenolol and Tekturna. Continue current meds at current dosing  Hyperlipidemia History of hyperlipidemia on statin therapy followed by her PCP      Lorretta Harp MD [facility] 12/31/2015 3:53  PM

## 2015-12-31 NOTE — Patient Instructions (Signed)
Medication Instructions:  Your physician recommends that you continue on your current medications as directed. Please refer to the Current Medication list given to you today.   Labwork: NONE  Testing/Procedures: NONE  Follow-Up: Your physician recommends that you schedule a follow-up ON AN AS NEEDED BASIS.   Any Other Special Instructions Will Be Listed Below (If Applicable).     If you need a refill on your cardiac medications before your next appointment, please call your pharmacy.

## 2015-12-31 NOTE — Assessment & Plan Note (Signed)
History of hyperlipidemia on statin therapy followed by her PCP. 

## 2016-01-20 DIAGNOSIS — T310 Burns involving less than 10% of body surface: Secondary | ICD-10-CM | POA: Diagnosis not present

## 2016-01-20 DIAGNOSIS — Z87828 Personal history of other (healed) physical injury and trauma: Secondary | ICD-10-CM | POA: Diagnosis not present

## 2016-01-20 DIAGNOSIS — Z09 Encounter for follow-up examination after completed treatment for conditions other than malignant neoplasm: Secondary | ICD-10-CM | POA: Diagnosis not present

## 2016-03-05 DIAGNOSIS — N182 Chronic kidney disease, stage 2 (mild): Secondary | ICD-10-CM | POA: Diagnosis not present

## 2016-03-05 DIAGNOSIS — N08 Glomerular disorders in diseases classified elsewhere: Secondary | ICD-10-CM | POA: Diagnosis not present

## 2016-03-05 DIAGNOSIS — E1122 Type 2 diabetes mellitus with diabetic chronic kidney disease: Secondary | ICD-10-CM | POA: Diagnosis not present

## 2016-03-05 DIAGNOSIS — I129 Hypertensive chronic kidney disease with stage 1 through stage 4 chronic kidney disease, or unspecified chronic kidney disease: Secondary | ICD-10-CM | POA: Diagnosis not present

## 2016-05-04 DIAGNOSIS — E1165 Type 2 diabetes mellitus with hyperglycemia: Secondary | ICD-10-CM | POA: Diagnosis not present

## 2016-05-04 DIAGNOSIS — E7800 Pure hypercholesterolemia, unspecified: Secondary | ICD-10-CM | POA: Diagnosis not present

## 2016-05-11 DIAGNOSIS — R635 Abnormal weight gain: Secondary | ICD-10-CM | POA: Diagnosis not present

## 2016-05-11 DIAGNOSIS — I10 Essential (primary) hypertension: Secondary | ICD-10-CM | POA: Diagnosis not present

## 2016-05-11 DIAGNOSIS — E1165 Type 2 diabetes mellitus with hyperglycemia: Secondary | ICD-10-CM | POA: Diagnosis not present

## 2016-05-11 DIAGNOSIS — E7800 Pure hypercholesterolemia, unspecified: Secondary | ICD-10-CM | POA: Diagnosis not present

## 2016-07-31 ENCOUNTER — Ambulatory Visit (INDEPENDENT_AMBULATORY_CARE_PROVIDER_SITE_OTHER): Payer: Medicare Other

## 2016-07-31 ENCOUNTER — Ambulatory Visit (HOSPITAL_COMMUNITY)
Admission: EM | Admit: 2016-07-31 | Discharge: 2016-07-31 | Disposition: A | Discharge status: Home / Self Care | Payer: Medicare Other | Attending: Family Medicine | Admitting: Family Medicine

## 2016-07-31 ENCOUNTER — Encounter (HOSPITAL_COMMUNITY): Payer: Self-pay | Admitting: Emergency Medicine

## 2016-07-31 DIAGNOSIS — J069 Acute upper respiratory infection, unspecified: Secondary | ICD-10-CM

## 2016-07-31 DIAGNOSIS — B9789 Other viral agents as the cause of diseases classified elsewhere: Secondary | ICD-10-CM

## 2016-07-31 DIAGNOSIS — R05 Cough: Secondary | ICD-10-CM | POA: Diagnosis not present

## 2016-07-31 MED ORDER — IPRATROPIUM BROMIDE 0.06 % NA SOLN: 2.0000 | Freq: Four times a day (QID) | NASAL | 1 refills | Status: DC

## 2016-07-31 MED ORDER — DEXTROMETHORPHAN POLISTIREX ER 30 MG/5ML PO SUER: 60.0000 mg | Freq: Two times a day (BID) | ORAL | 1 refills | Status: DC

## 2016-07-31 NOTE — ED Triage Notes (Signed)
Pt has been suffering from fatigue, dizziness, coughing, and nasal congestion since Wednesday.  She states she is a diabetic, not on any medication and her blood sugars have been running high.

## 2016-07-31 NOTE — Discharge Instructions (Signed)
Drink plenty of fluids as discussed, use medicine as prescribed, and mucinex or delsym for cough. Return or see your doctor if further problems °

## 2016-07-31 NOTE — ED Provider Notes (Signed)
[facility]    CSN: [id number] Arrival date & time: 07/31/16  1638     History   Chief Complaint Chief Complaint  Patient presents with  . URI    HPI Renee Bradley is a 70 y.o. female.   The history is provided by the patient.  URI  Presenting symptoms: congestion, cough, fever and rhinorrhea   Severity:  Mild Onset quality:  Gradual Duration:  3 days Progression:  Unchanged Chronicity:  New Relieved by:  None tried Worsened by:  Nothing Ineffective treatments:  None tried   Past Medical History:  Diagnosis Date  . Asthma   . Chest pressure 07/13/2012  . Diabetes mellitus without complication (HCC)   . DM (diabetes mellitus) (HCC) 07/13/2012  . HTN (hypertension) 07/13/2012  . Hypercholesterolemia   . Hypertension   . SOB (shortness of breath) 07/13/2012  . Tachycardia, unspecified, "fluttering" 07/13/2012    Patient Active Problem List   Diagnosis Date Noted  . Chest pressure 07/13/2012  . SOB (shortness of breath) 07/13/2012  . DM (diabetes mellitus) (HCC) 07/13/2012  . HTN (hypertension) 07/13/2012  . Hyperlipidemia 07/13/2012  . Tachycardia, unspecified, "fluttering" 07/13/2012    Past Surgical History:  Procedure Laterality Date  . ABDOMINAL HYSTERECTOMY      OB History    No data available       Home Medications    Prior to Admission medications   Medication Sig Start Date End Date Taking? Authorizing Provider  aliskiren (TEKTURNA) 300 MG tablet Take 300 mg by mouth at bedtime.    Yes Historical Provider, MD  aspirin EC 81 MG tablet Take 81 mg by mouth daily.   Yes Historical Provider, MD  atenolol (TENORMIN) 50 MG tablet Take 100 mg by mouth daily.   Yes Historical Provider, MD  atorvastatin (LIPITOR) 20 MG tablet Take 20 mg by mouth at bedtime.    Yes Historical Provider, MD  ergocalciferol (VITAMIN D2) 50000 units capsule Take 50,000 Units by mouth once a week.   Yes Historical Provider, MD  furosemide (LASIX) 20 MG  tablet Take 20 mg by mouth at bedtime.    Yes Historical Provider, MD  dextromethorphan (DELSYM) 30 MG/5ML liquid Take 10 mLs (60 mg total) by mouth 2 (two) times daily. 07/31/16   Linna HoffJames D Brenna Friesenhahn, MD  ipratropium (ATROVENT) 0.06 % nasal spray Place 2 sprays into both nostrils 4 (four) times daily. 07/31/16   Linna HoffJames D Bellany Elbaum, MD    Family History Family History  Problem Relation Age of Onset  . Heart attack Mother   . Diabetes type II Mother   . Stroke Mother   . Alzheimer's disease Father   . Diabetes type II Brother     Social History Social History  Substance Use Topics  . Smoking status: Former Smoker    Quit date: 05/13/1983  . Smokeless tobacco: Never Used  . Alcohol use No     Allergies   Janumet [sitagliptin-metformin hcl]; Januvia [sitagliptin]; Kombiglyze [saxagliptin-metformin er]; and Sulfur   Review of Systems Review of Systems  Constitutional: Positive for fever.  HENT: Positive for congestion and rhinorrhea.   Respiratory: Positive for cough.      Physical Exam Triage Vital Signs ED Triage Vitals [07/31/16 1709]  Enc Vitals Group     BP 146/81     Pulse Rate 64     Resp      Temp 100.9 F (38.3 C)     Temp Source Oral  SpO2 95 %     Weight      Height      Head Circumference      Peak Flow      Pain Score      Pain Loc      Pain Edu?      Excl. in GC?    No data found.   Updated Vital Signs BP 146/81 (BP Location: Left Arm)   Pulse 64   Temp 100.9 F (38.3 C) (Oral)   SpO2 95%   Visual Acuity Right Eye Distance:   Left Eye Distance:   Bilateral Distance:    Right Eye Near:   Left Eye Near:    Bilateral Near:     Physical Exam   UC Treatments / Results  Labs (all labs ordered are listed, but only abnormal results are displayed) Labs Reviewed - No data to display  EKG  EKG Interpretation None       Radiology Dg Chest 2 View  Result Date: 07/31/2016 CLINICAL DATA:  Cough. EXAM: CHEST  2 VIEW COMPARISON:   Radiographs of September 29, 2014. FINDINGS: The heart size and mediastinal contours are within normal limits. Both lungs are clear. No pneumothorax or pleural effusion is noted. The visualized skeletal structures are unremarkable. IMPRESSION: No active cardiopulmonary disease. Electronically Signed   By: Lupita Raider, M.D.   On: 07/31/2016 17:35   X-rays reviewed and report per radiologist.  Procedures Procedures (including critical care time)  Medications Ordered in UC Medications - No data to display   Initial Impression / Assessment and Plan / UC Course  I have reviewed the triage vital signs and the nursing notes.  Pertinent labs & imaging results that were available during my care of the patient were reviewed by me and considered in my medical decision making (see chart for details).  Clinical Course       Final Clinical Impressions(s) / UC Diagnoses   Final diagnoses:  Viral URI with cough    New Prescriptions Discharge Medication List as of 07/31/2016  5:45 PM    START taking these medications   Details  dextromethorphan (DELSYM) 30 MG/5ML liquid Take 10 mLs (60 mg total) by mouth 2 (two) times daily., Starting Fri 07/31/2016, Print    ipratropium (ATROVENT) 0.06 % nasal spray Place 2 sprays into both nostrils 4 (four) times daily., Starting Fri 07/31/2016, Print         Linna Hoff, MD 07/31/16 2131

## 2016-10-19 DIAGNOSIS — R7309 Other abnormal glucose: Secondary | ICD-10-CM | POA: Diagnosis not present

## 2016-10-19 DIAGNOSIS — Z9114 Patient's other noncompliance with medication regimen: Secondary | ICD-10-CM | POA: Diagnosis not present

## 2016-10-19 DIAGNOSIS — I10 Essential (primary) hypertension: Secondary | ICD-10-CM | POA: Diagnosis not present

## 2016-10-19 DIAGNOSIS — E785 Hyperlipidemia, unspecified: Secondary | ICD-10-CM | POA: Diagnosis not present

## 2016-10-19 DIAGNOSIS — E559 Vitamin D deficiency, unspecified: Secondary | ICD-10-CM | POA: Diagnosis not present

## 2016-11-30 DIAGNOSIS — N182 Chronic kidney disease, stage 2 (mild): Secondary | ICD-10-CM | POA: Diagnosis not present

## 2016-11-30 DIAGNOSIS — Z0000 Encounter for general adult medical examination without abnormal findings: Secondary | ICD-10-CM | POA: Diagnosis not present

## 2016-11-30 DIAGNOSIS — I129 Hypertensive chronic kidney disease with stage 1 through stage 4 chronic kidney disease, or unspecified chronic kidney disease: Secondary | ICD-10-CM | POA: Diagnosis not present

## 2016-11-30 DIAGNOSIS — E1122 Type 2 diabetes mellitus with diabetic chronic kidney disease: Secondary | ICD-10-CM | POA: Diagnosis not present

## 2016-11-30 DIAGNOSIS — N08 Glomerular disorders in diseases classified elsewhere: Secondary | ICD-10-CM | POA: Diagnosis not present

## 2017-01-19 DIAGNOSIS — N390 Urinary tract infection, site not specified: Secondary | ICD-10-CM | POA: Diagnosis not present

## 2017-01-19 DIAGNOSIS — R197 Diarrhea, unspecified: Secondary | ICD-10-CM | POA: Diagnosis not present

## 2017-04-06 DIAGNOSIS — M792 Neuralgia and neuritis, unspecified: Secondary | ICD-10-CM | POA: Diagnosis not present

## 2017-04-06 DIAGNOSIS — T2230XS Burn of third degree of shoulder and upper limb, except wrist and hand, unspecified site, sequela: Secondary | ICD-10-CM | POA: Diagnosis not present

## 2017-04-06 DIAGNOSIS — M25532 Pain in left wrist: Secondary | ICD-10-CM | POA: Diagnosis not present

## 2017-04-06 DIAGNOSIS — Z945 Skin transplant status: Secondary | ICD-10-CM | POA: Diagnosis not present

## 2017-04-06 DIAGNOSIS — T23302S Burn of third degree of left hand, unspecified site, sequela: Secondary | ICD-10-CM | POA: Diagnosis not present

## 2017-04-06 DIAGNOSIS — T310 Burns involving less than 10% of body surface: Secondary | ICD-10-CM | POA: Diagnosis not present

## 2017-05-04 DIAGNOSIS — R635 Abnormal weight gain: Secondary | ICD-10-CM | POA: Diagnosis not present

## 2017-05-04 DIAGNOSIS — I10 Essential (primary) hypertension: Secondary | ICD-10-CM | POA: Diagnosis not present

## 2017-05-04 DIAGNOSIS — E7800 Pure hypercholesterolemia, unspecified: Secondary | ICD-10-CM | POA: Diagnosis not present

## 2017-05-04 DIAGNOSIS — E1165 Type 2 diabetes mellitus with hyperglycemia: Secondary | ICD-10-CM | POA: Diagnosis not present

## 2017-05-07 DIAGNOSIS — I10 Essential (primary) hypertension: Secondary | ICD-10-CM | POA: Diagnosis not present

## 2017-05-07 DIAGNOSIS — E1165 Type 2 diabetes mellitus with hyperglycemia: Secondary | ICD-10-CM | POA: Diagnosis not present

## 2017-05-07 DIAGNOSIS — E7800 Pure hypercholesterolemia, unspecified: Secondary | ICD-10-CM | POA: Diagnosis not present

## 2017-05-10 DIAGNOSIS — L905 Scar conditions and fibrosis of skin: Secondary | ICD-10-CM | POA: Diagnosis not present

## 2017-05-10 DIAGNOSIS — L910 Hypertrophic scar: Secondary | ICD-10-CM | POA: Diagnosis not present

## 2017-05-10 DIAGNOSIS — T23072S Burn of unspecified degree of left wrist, sequela: Secondary | ICD-10-CM | POA: Diagnosis not present

## 2017-06-02 DIAGNOSIS — N182 Chronic kidney disease, stage 2 (mild): Secondary | ICD-10-CM | POA: Diagnosis not present

## 2017-06-02 DIAGNOSIS — E2839 Other primary ovarian failure: Secondary | ICD-10-CM | POA: Diagnosis not present

## 2017-06-02 DIAGNOSIS — R42 Dizziness and giddiness: Secondary | ICD-10-CM | POA: Diagnosis not present

## 2017-06-02 DIAGNOSIS — Z23 Encounter for immunization: Secondary | ICD-10-CM | POA: Diagnosis not present

## 2017-06-02 DIAGNOSIS — I129 Hypertensive chronic kidney disease with stage 1 through stage 4 chronic kidney disease, or unspecified chronic kidney disease: Secondary | ICD-10-CM | POA: Diagnosis not present

## 2017-06-07 ENCOUNTER — Ambulatory Visit (INDEPENDENT_AMBULATORY_CARE_PROVIDER_SITE_OTHER): Payer: Medicare Other | Admitting: Podiatry

## 2017-06-07 ENCOUNTER — Encounter: Payer: Self-pay | Admitting: Podiatry

## 2017-06-07 DIAGNOSIS — Q828 Other specified congenital malformations of skin: Secondary | ICD-10-CM

## 2017-06-07 DIAGNOSIS — E119 Type 2 diabetes mellitus without complications: Secondary | ICD-10-CM | POA: Diagnosis not present

## 2017-06-07 NOTE — Patient Instructions (Signed)

## 2017-06-08 NOTE — Progress Notes (Signed)
Patient ID: Renee Bradley, female   DOB: Jan 26, 1946, 71 y.o.   MRN: [id number]   Subjective: A she presents today primarily because of painful corn on the fifth left toe and secondarily because of the plantar nucleated keratoses on the right foot. Patient was last treated for the symptomatic lesion right foot on the visit of 05/17 2017 Patient has a history of diabetes, however, is not taking any medication  Objective:   Patient appears orientated 3  Vascular: DP right trace palpable DP left 2/4 PT pulses 2/4 bilaterally Capillary reflex immediate bilaterally  Neurological: Sensation to 10 g monofilament wire intact 5/5 bilaterally Vibratory sensation reactive bilaterally Ankle reflex equal and reactive bilaterally  Dermatological: No open skin lesions bilaterally Nucleated keratoses plantar third right MPJ Nucleated keratoses fifth left toe  Musculoskeletal: Hammertoe second right There is no restriction ankle, subtalar, midtarsal joints bilaterally  Assessment: Satisfactory neurovascular status Diabetic without foot complications Porokeratosis 1 plantar right Corn 1 , fifth left toe  Plan: Debride corn 1 and porokeratosis 1 without a bleeding  Reappoint at patient's request

## 2017-06-10 ENCOUNTER — Other Ambulatory Visit: Payer: Self-pay | Admitting: Internal Medicine

## 2017-06-10 DIAGNOSIS — E2839 Other primary ovarian failure: Secondary | ICD-10-CM

## 2017-06-10 DIAGNOSIS — Z1231 Encounter for screening mammogram for malignant neoplasm of breast: Secondary | ICD-10-CM

## 2017-07-08 ENCOUNTER — Other Ambulatory Visit: Payer: Self-pay

## 2017-07-08 DIAGNOSIS — M25562 Pain in left knee: Secondary | ICD-10-CM | POA: Diagnosis not present

## 2017-07-08 DIAGNOSIS — M1712 Unilateral primary osteoarthritis, left knee: Secondary | ICD-10-CM | POA: Diagnosis not present

## 2017-07-08 DIAGNOSIS — M1612 Unilateral primary osteoarthritis, left hip: Secondary | ICD-10-CM | POA: Diagnosis not present

## 2017-07-12 ENCOUNTER — Ambulatory Visit: Payer: Medicare Other

## 2017-07-12 ENCOUNTER — Other Ambulatory Visit: Payer: Medicare Other

## 2017-08-11 ENCOUNTER — Ambulatory Visit: Payer: Medicare Other

## 2017-08-11 ENCOUNTER — Inpatient Hospital Stay
Admission: RE | Admit: 2017-08-11 | Discharge: 2017-08-11 | Disposition: A | Discharge status: Home / Self Care | Payer: Medicare Other | Source: Ambulatory Visit | Attending: Internal Medicine | Admitting: Internal Medicine

## 2017-08-12 DIAGNOSIS — M109 Gout, unspecified: Secondary | ICD-10-CM | POA: Diagnosis not present

## 2017-08-12 DIAGNOSIS — I129 Hypertensive chronic kidney disease with stage 1 through stage 4 chronic kidney disease, or unspecified chronic kidney disease: Secondary | ICD-10-CM | POA: Diagnosis not present

## 2017-08-12 DIAGNOSIS — Z79899 Other long term (current) drug therapy: Secondary | ICD-10-CM | POA: Diagnosis not present

## 2017-08-12 DIAGNOSIS — N182 Chronic kidney disease, stage 2 (mild): Secondary | ICD-10-CM | POA: Diagnosis not present

## 2017-09-06 DIAGNOSIS — Z79899 Other long term (current) drug therapy: Secondary | ICD-10-CM | POA: Diagnosis not present

## 2017-09-06 DIAGNOSIS — M109 Gout, unspecified: Secondary | ICD-10-CM | POA: Diagnosis not present

## 2017-09-06 DIAGNOSIS — I10 Essential (primary) hypertension: Secondary | ICD-10-CM | POA: Diagnosis not present

## 2017-09-06 DIAGNOSIS — M79672 Pain in left foot: Secondary | ICD-10-CM | POA: Diagnosis not present

## 2017-09-23 DIAGNOSIS — E7800 Pure hypercholesterolemia, unspecified: Secondary | ICD-10-CM | POA: Diagnosis not present

## 2017-09-23 DIAGNOSIS — E1165 Type 2 diabetes mellitus with hyperglycemia: Secondary | ICD-10-CM | POA: Diagnosis not present

## 2017-09-23 DIAGNOSIS — I10 Essential (primary) hypertension: Secondary | ICD-10-CM | POA: Diagnosis not present

## 2017-09-27 ENCOUNTER — Other Ambulatory Visit: Payer: Self-pay | Admitting: Internal Medicine

## 2017-09-27 ENCOUNTER — Ambulatory Visit
Admission: RE | Admit: 2017-09-27 | Discharge: 2017-09-27 | Disposition: A | Discharge status: Home / Self Care | Payer: Medicare Other | Source: Ambulatory Visit | Attending: Internal Medicine | Admitting: Internal Medicine

## 2017-09-27 DIAGNOSIS — Z1231 Encounter for screening mammogram for malignant neoplasm of breast: Secondary | ICD-10-CM | POA: Diagnosis not present

## 2017-09-27 DIAGNOSIS — M8589 Other specified disorders of bone density and structure, multiple sites: Secondary | ICD-10-CM | POA: Diagnosis not present

## 2017-09-27 DIAGNOSIS — E2839 Other primary ovarian failure: Secondary | ICD-10-CM

## 2017-09-27 DIAGNOSIS — Z780 Asymptomatic menopausal state: Secondary | ICD-10-CM | POA: Diagnosis not present

## 2017-12-23 DIAGNOSIS — M25552 Pain in left hip: Secondary | ICD-10-CM | POA: Diagnosis not present

## 2017-12-23 DIAGNOSIS — M25562 Pain in left knee: Secondary | ICD-10-CM | POA: Diagnosis not present

## 2017-12-23 DIAGNOSIS — M1612 Unilateral primary osteoarthritis, left hip: Secondary | ICD-10-CM | POA: Diagnosis not present

## 2018-01-08 IMAGING — DX DG CHEST 2V
2 series · 2 of 2 positions shown · non-contrast
Comparison: Radiographs September 29, 2014.

CLINICAL DATA: Cough.

EXAM:
CHEST  2 VIEW

[chest pa]
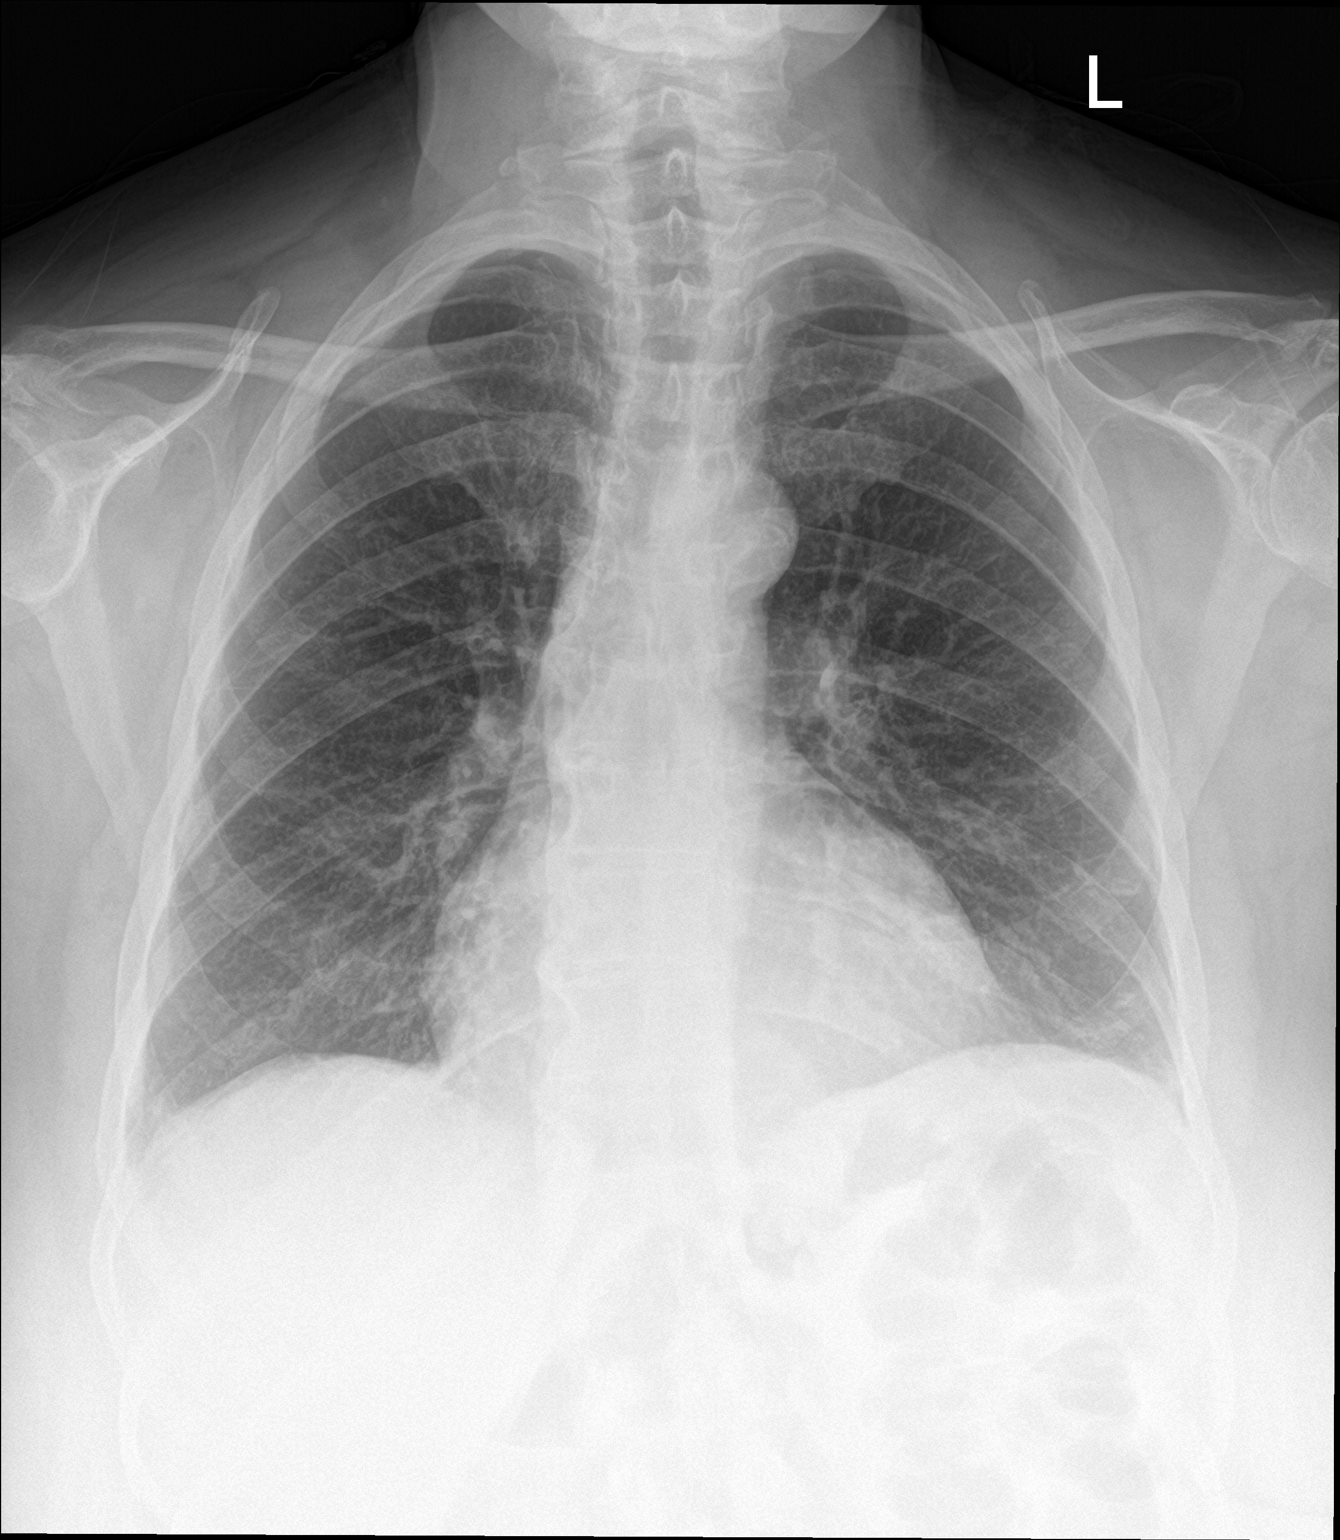

[chest lat]
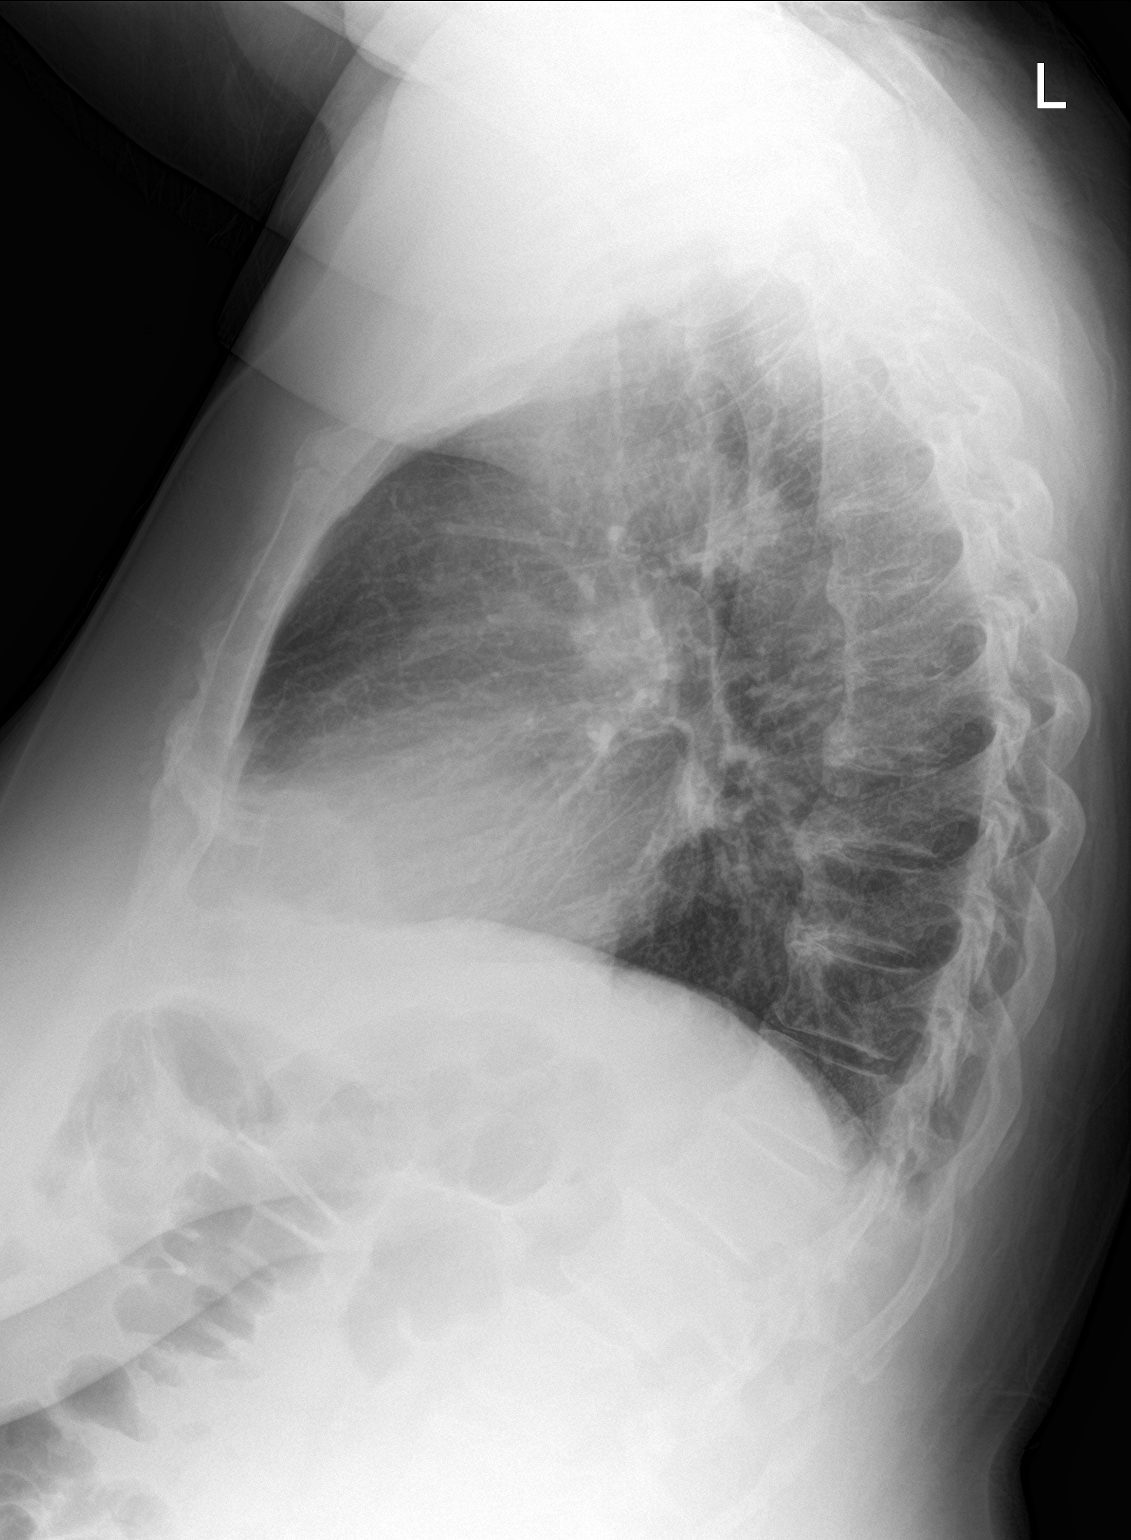

[2 of 2 positions shown; findings below may reference images not displayed]

FINDINGS: The heart size and mediastinal contours are within normal limits.
Both lungs are clear. No pneumothorax or pleural effusion is noted.
The visualized skeletal structures are unremarkable.
IMPRESSION: No active cardiopulmonary disease.

## 2018-01-13 DIAGNOSIS — I129 Hypertensive chronic kidney disease with stage 1 through stage 4 chronic kidney disease, or unspecified chronic kidney disease: Secondary | ICD-10-CM | POA: Diagnosis not present

## 2018-01-13 DIAGNOSIS — E668 Other obesity: Secondary | ICD-10-CM | POA: Diagnosis not present

## 2018-01-13 DIAGNOSIS — Z1211 Encounter for screening for malignant neoplasm of colon: Secondary | ICD-10-CM | POA: Diagnosis not present

## 2018-01-13 DIAGNOSIS — N182 Chronic kidney disease, stage 2 (mild): Secondary | ICD-10-CM | POA: Diagnosis not present

## 2018-01-13 DIAGNOSIS — E119 Type 2 diabetes mellitus without complications: Secondary | ICD-10-CM | POA: Diagnosis not present

## 2018-01-13 DIAGNOSIS — Z1389 Encounter for screening for other disorder: Secondary | ICD-10-CM | POA: Diagnosis not present

## 2018-01-13 DIAGNOSIS — E559 Vitamin D deficiency, unspecified: Secondary | ICD-10-CM | POA: Diagnosis not present

## 2018-06-07 ENCOUNTER — Other Ambulatory Visit: Payer: Self-pay | Admitting: Internal Medicine

## 2018-06-14 ENCOUNTER — Encounter: Payer: Self-pay | Admitting: Internal Medicine

## 2018-06-14 ENCOUNTER — Ambulatory Visit (INDEPENDENT_AMBULATORY_CARE_PROVIDER_SITE_OTHER): Payer: Medicare Other | Admitting: Internal Medicine

## 2018-06-14 VITALS — BP 120/70 | HR 54 | Temp 97.8°F | Ht 63.0 in | Wt 193.4 lb

## 2018-06-14 DIAGNOSIS — Z87898 Personal history of other specified conditions: Secondary | ICD-10-CM

## 2018-06-14 DIAGNOSIS — E1165 Type 2 diabetes mellitus with hyperglycemia: Secondary | ICD-10-CM | POA: Diagnosis not present

## 2018-06-14 DIAGNOSIS — Z23 Encounter for immunization: Secondary | ICD-10-CM | POA: Diagnosis not present

## 2018-06-14 DIAGNOSIS — K529 Noninfective gastroenteritis and colitis, unspecified: Secondary | ICD-10-CM

## 2018-06-14 DIAGNOSIS — I10 Essential (primary) hypertension: Secondary | ICD-10-CM

## 2018-06-14 DIAGNOSIS — Z09 Encounter for follow-up examination after completed treatment for conditions other than malignant neoplasm: Secondary | ICD-10-CM

## 2018-06-14 NOTE — Progress Notes (Signed)
Subjective:     Patient ID: Renee Bradley , female    DOB: 1946-02-26 , 72 y.o.   MRN: [id number]   Chief Complaint  Patient presents with  . Follow-up    stomach pain , acid reflux     HPI Pt had GE's last week with 11/6 ate late and took her ASA at bed time, and in the middle of the night had lower abdominal cramps, with N/V with GERD symptoms. Had 2-3 emesis episodes, and 3-4 watery BM's. Did not see blood in vomit or stool, and no coffee ground matter. Felt bette after having diarrhea. She knows to not eat late and thinks she caused this to herself. She did not eat out, only what she had fixed at home which had not been sitting out. Since she changed her diet and eating light things, soups, and softs foods her symptoms have resolved. She denies having any more GERD symptoms either.    Past Medical History:  Diagnosis Date  . Asthma   . Chest pressure 07/13/2012  . Diabetes mellitus without complication (HCC)   . DM (diabetes mellitus) (HCC) 07/13/2012  . HTN (hypertension) 07/13/2012  . Hypercholesterolemia   . Hypertension   . SOB (shortness of breath) 07/13/2012  . Tachycardia, unspecified, "fluttering" 07/13/2012     Family History  Problem Relation Age of Onset  . Heart attack Mother   . Diabetes type II Mother   . Stroke Mother   . Alzheimer's disease Father   . Diabetes type II Brother      Current Outpatient Medications:  .  amLODipine (NORVASC) 5 MG tablet, , Disp: , Rfl:  .  aspirin EC 81 MG tablet, Take 81 mg by mouth daily., Disp: , Rfl:  .  atenolol (TENORMIN) 50 MG tablet, Take 100 mg by mouth daily., Disp: , Rfl:  .  atorvastatin (LIPITOR) 20 MG tablet, Take 20 mg by mouth at bedtime. , Disp: , Rfl:  .  ergocalciferol (VITAMIN D2) 50000 units capsule, Take 50,000 Units by mouth once a week., Disp: , Rfl:  .  furosemide (LASIX) 20 MG tablet, TAKE 1 TABLET DAILY, Disp: 90 tablet, Rfl: 2 .  Lancets (ONETOUCH DELICA PLUS LANCET33G) MISC, , Disp: , Rfl:  .   ONETOUCH VERIO test strip, , Disp: , Rfl:    Allergies  Allergen Reactions  . Janumet [Sitagliptin-Metformin Hcl] Shortness Of Breath  . Januvia [Sitagliptin] Shortness Of Breath  . Kombiglyze [Saxagliptin-Metformin Er] Shortness Of Breath  . Sulfur Itching     Review of Systems  Constitutional: Negative for chills, diaphoresis, fatigue and fever.  HENT: Negative for tinnitus.   Eyes: Negative for visual disturbance.  Respiratory: Negative for chest tightness and shortness of breath.   Cardiovascular: Negative for chest pain, palpitations and leg swelling.  Gastrointestinal: Negative for abdominal distention, abdominal pain, blood in stool, constipation, diarrhea, nausea and vomiting.  Endocrine: Negative for polydipsia and polyphagia.  Genitourinary: Negative for dysuria.  Skin: Negative for rash.  Neurological: Negative for dizziness, speech difficulty, weakness, light-headedness and headaches.   Today's Vitals   06/14/18 1538  BP: 120/70  Pulse: (!) 54  Temp: 97.8 F (36.6 C)  TempSrc: Oral  SpO2: 96%  Weight: 193 lb 6.4 oz (87.7 kg)  Height: 5\' 3"  (1.6 m)   Body mass index is 34.26 kg/m.   Objective:  Physical Exam   Constitutional: She is oriented to person, place, and time. She appears well-developed and well-nourished. No distress.  HENT:  Head: Normocephalic and atraumatic.  Right Ear: External ear normal.  Left Ear: External ear normal.  Nose: Nose normal.  Eyes: Conjunctivae are normal. Right eye exhibits no discharge. Left eye exhibits no discharge. No scleral icterus.  Neck: Neck supple.   Cardiovascular: Normal rate and regular rhythm, brady which her normal pulse has been in the 50's.  No murmur heard. Pulmonary/Chest: Effort normal and breath sounds normal. No respiratory distress.  ABD- + BS, soft, no masses tenderness or hepatosplenomegaly.  Musculoskeletal: Normal range of motion. She exhibits no edema.  Lymphadenopathy:   She has no cervical  adenopathy.  Neurological: She is alert and oriented to person, place, and time.  Skin: Skin is warm and dry. Capillary refill takes less than 2 seconds. No rash noted. She is not diaphoretic.  Psychiatric: She has a normal mood and affect. Her behavior is normal. Judgment and thought content normal.  Nursing note reviewed.  Assessment And Plan:    1. Need for influenza vaccination- given - Flu vaccine HIGH DOSE PF (Fluzone High dose)  2. Uncontrolled type 2 diabetes mellitus with hyperglycemia (HCC)- chronic. We will see her results first before we do any adjustments on her medication. For now will continue same medications.  - Hemoglobin A1c  3. Essential hypertension- chronic and stable. Will continue same medications.  - CBC no Diff - CMP14 + Anion Gap - Lipid Profile  4. Gastroenteritis- resolved. No action.   FU in 3 months for DM and HTN.   Nicolas Sisler RODRIGUEZ-SOUTHWORTH, PA-C

## 2018-06-14 NOTE — Patient Instructions (Signed)
Try Papay about 1/2 cup instead of a carb with your meals and will help you digest your food better  I suspect you had a stomach virus. Good job changing your diet and helping you get better.   Influenza (Flu) Vaccine (Inactivated or Recombinant): What You Need to Know 1. Why get vaccinated? Influenza ("flu") is a contagious disease that spreads around the [place] every year, usually between October and May. Flu is caused by influenza viruses, and is spread mainly by coughing, sneezing, and close contact. Anyone can get flu. Flu strikes suddenly and can last several days. Symptoms vary by age, but can include:  fever/chills  sore throat  muscle aches  fatigue  cough  headache  runny or stuffy nose  Flu can also lead to pneumonia and blood infections, and cause diarrhea and seizures in children. If you have a medical condition, such as heart or lung disease, flu can make it worse. Flu is more dangerous for some people. Infants and young children, people 76 years of age and older, pregnant women, and people with certain health conditions or a weakened immune system are at greatest risk. Each year thousands of people in the [place] die from flu, and many more are hospitalized. Flu vaccine can:  keep you from getting flu,  make flu less severe if you do get it, and  keep you from spreading flu to your family and other people. 2. Inactivated and recombinant flu vaccines A dose of flu vaccine is recommended every flu season. Children 6 months through 62 years of age may need two doses during the same flu season. Everyone else needs only one dose each flu season. Some inactivated flu vaccines contain a very small amount of a mercury-based preservative called thimerosal. Studies have not shown thimerosal in vaccines to be harmful, but flu vaccines that do not contain thimerosal are available. There is no live flu virus in flu shots. They cannot cause the flu. There are many  flu viruses, and they are always changing. Each year a new flu vaccine is made to protect against three or four viruses that are likely to cause disease in the upcoming flu season. But even when the vaccine doesn't exactly match these viruses, it may still provide some protection. Flu vaccine cannot prevent:  flu that is caused by a virus not covered by the vaccine, or  illnesses that look like flu but are not.  It takes about 2 weeks for protection to develop after vaccination, and protection lasts through the flu season. 3. Some people should not get this vaccine Tell the person who is giving you the vaccine:  If you have any severe, life-threatening allergies. If you ever had a life-threatening allergic reaction after a dose of flu vaccine, or have a severe allergy to any part of this vaccine, you may be advised not to get vaccinated. Most, but not all, types of flu vaccine contain a small amount of egg protein.  If you ever had Guillain-Barr Syndrome (also called GBS). Some people with a history of GBS should not get this vaccine. This should be discussed with your doctor.  If you are not feeling well. It is usually okay to get flu vaccine when you have a mild illness, but you might be asked to come back when you feel better.  4. Risks of a vaccine reaction With any medicine, including vaccines, there is a chance of reactions. These are usually mild and go away on their own, but  serious reactions are also possible. Most people who get a flu shot do not have any problems with it. Minor problems following a flu shot include:  soreness, redness, or swelling where the shot was given  hoarseness  sore, red or itchy eyes  cough  fever  aches  headache  itching  fatigue  If these problems occur, they usually begin soon after the shot and last 1 or 2 days. More serious problems following a flu shot can include the following:  There may be a small increased risk of  Guillain-Barre Syndrome (GBS) after inactivated flu vaccine. This risk has been estimated at 1 or 2 additional cases per million people vaccinated. This is much lower than the risk of severe complications from flu, which can be prevented by flu vaccine.  Young children who get the flu shot along with pneumococcal vaccine (PCV13) and/or DTaP vaccine at the same time might be slightly more likely to have a seizure caused by fever. Ask your doctor for more information. Tell your doctor if a child who is getting flu vaccine has ever had a seizure.  Problems that could happen after any injected vaccine:  People sometimes faint after a medical procedure, including vaccination. Sitting or lying down for about 15 minutes can help prevent fainting, and injuries caused by a fall. Tell your doctor if you feel dizzy, or have vision changes or ringing in the ears.  Some people get severe pain in the shoulder and have difficulty moving the arm where a shot was given. This happens very rarely.  Any medication can cause a severe allergic reaction. Such reactions from a vaccine are very rare, estimated at about 1 in a million doses, and would happen within a few minutes to a few hours after the vaccination. As with any medicine, there is a very remote chance of a vaccine causing a serious injury or death. The safety of vaccines is always being monitored. For more information, visit: http://floyd.org/ 5. What if there is a serious reaction? What should I look for? Look for anything that concerns you, such as signs of a severe allergic reaction, very high fever, or unusual behavior. Signs of a severe allergic reaction can include hives, swelling of the face and throat, difficulty breathing, a fast heartbeat, dizziness, and weakness. These would start a few minutes to a few hours after the vaccination. What should I do?  If you think it is a severe allergic reaction or other emergency that can't wait, call  9-1-1 and get the person to the nearest hospital. Otherwise, call your doctor.  Reactions should be reported to the Vaccine Adverse Event Reporting System (VAERS). Your doctor should file this report, or you can do it yourself through the VAERS web site at www.vaers.LAgents.no, or by calling 1-564-868-3623. ? VAERS does not give medical advice. 6. The National Vaccine Injury Compensation Program The [employer] Vaccine Injury Compensation Program (VICP) is a federal program that was created to compensate people who may have been injured by certain vaccines. Persons who believe they may have been injured by a vaccine can learn about the program and about filing a claim by calling 1-813-327-4896 or visiting the VICP website at SpiritualWord.at. There is a time limit to file a claim for compensation. 7. How can I learn more?  Ask your healthcare provider. He or she can give you the vaccine package insert or suggest other sources of information.  Call your local or state health department.  Contact the Centers for Disease  Control and Prevention (CDC): ? Call 272-375-3459 (1-800-CDC-INFO) or ? Visit CDC's website at BiotechRoom.com.cy Vaccine Information Statement, Inactivated Influenza Vaccine (03/09/2014) This information is not intended to replace advice given to you by your health care provider. Make sure you discuss any questions you have with your health care provider. Document Released: 05/14/2006 Document Revised: 04/09/2016 Document Reviewed: 04/09/2016 Elsevier Interactive Patient Education  2017 [employer].

## 2018-06-15 ENCOUNTER — Encounter: Payer: Self-pay | Admitting: Internal Medicine

## 2018-06-15 LAB — LIPID PANEL
CHOL/HDL RATIO: 3.3 ratio (ref 0.0–4.4)
Cholesterol, Total: 192 mg/dL (ref 100–199)
HDL: 59 mg/dL (ref >39)
LDL CALC: 114 mg/dL — AB (ref 0–99)
TRIGLYCERIDES: 97 mg/dL (ref 0–149)
VLDL Cholesterol Cal: 19 mg/dL (ref 5–40)

## 2018-06-15 LAB — CMP14 + ANION GAP
A/G RATIO: 1.9 (ref 1.2–2.2)
ALT: 9 IU/L (ref 0–32)
ANION GAP: 18 mmol/L (ref 10.0–18.0)
AST: 13 IU/L (ref 0–40)
Albumin: 4.5 g/dL (ref 3.5–4.8)
Alkaline Phosphatase: 87 IU/L (ref 39–117)
BUN/Creatinine Ratio: 14 (ref 12–28)
BUN: 13 mg/dL (ref 8–27)
Bilirubin Total: 1.1 mg/dL (ref 0.0–1.2)
CALCIUM: 9.6 mg/dL (ref 8.7–10.3)
CO2: 22 mmol/L (ref 20–29)
CREATININE: 0.92 mg/dL (ref 0.57–1.00)
Chloride: 106 mmol/L (ref 96–106)
GFR, EST AFRICAN AMERICAN: 72 mL/min/{1.73_m2} (ref >59)
GFR, EST NON AFRICAN AMERICAN: 63 mL/min/{1.73_m2} (ref >59)
Globulin, Total: 2.4 g/dL (ref 1.5–4.5)
Glucose: 91 mg/dL (ref 65–99)
POTASSIUM: 4 mmol/L (ref 3.5–5.2)
Sodium: 146 mmol/L — ABNORMAL HIGH (ref 134–144)
TOTAL PROTEIN: 6.9 g/dL (ref 6.0–8.5)

## 2018-06-15 LAB — CBC
Hematocrit: 43.2 % (ref 34.0–46.6)
Hemoglobin: 14.1 g/dL (ref 11.1–15.9)
MCH: 28.4 pg (ref 26.6–33.0)
MCHC: 32.6 g/dL (ref 31.5–35.7)
MCV: 87 fL (ref 79–97)
PLATELETS: 247 10*3/uL (ref 150–450)
RBC: 4.96 x10E6/uL (ref 3.77–5.28)
RDW: 14.1 % (ref 12.3–15.4)
WBC: 5 10*3/uL (ref 3.4–10.8)

## 2018-06-15 LAB — HEMOGLOBIN A1C
Est. average glucose Bld gHb Est-mCnc: 126 mg/dL
HEMOGLOBIN A1C: 6 % — AB (ref 4.8–5.6)

## 2018-07-13 ENCOUNTER — Encounter: Payer: Self-pay | Admitting: Internal Medicine

## 2018-07-13 ENCOUNTER — Ambulatory Visit (INDEPENDENT_AMBULATORY_CARE_PROVIDER_SITE_OTHER): Payer: Medicare Other | Admitting: Internal Medicine

## 2018-07-13 VITALS — BP 154/86 | HR 58 | Temp 98.0°F | Ht 61.0 in | Wt 194.0 lb

## 2018-07-13 DIAGNOSIS — Z9114 Patient's other noncompliance with medication regimen: Secondary | ICD-10-CM | POA: Diagnosis not present

## 2018-07-13 DIAGNOSIS — I129 Hypertensive chronic kidney disease with stage 1 through stage 4 chronic kidney disease, or unspecified chronic kidney disease: Secondary | ICD-10-CM

## 2018-07-13 DIAGNOSIS — N182 Chronic kidney disease, stage 2 (mild): Secondary | ICD-10-CM | POA: Diagnosis not present

## 2018-07-13 DIAGNOSIS — Z6836 Body mass index (BMI) 36.0-36.9, adult: Secondary | ICD-10-CM | POA: Diagnosis not present

## 2018-07-13 DIAGNOSIS — E1122 Type 2 diabetes mellitus with diabetic chronic kidney disease: Secondary | ICD-10-CM

## 2018-07-13 DIAGNOSIS — L659 Nonscarring hair loss, unspecified: Secondary | ICD-10-CM

## 2018-07-13 DIAGNOSIS — I1310 Hypertensive heart and chronic kidney disease without heart failure, with stage 1 through stage 4 chronic kidney disease, or unspecified chronic kidney disease: Secondary | ICD-10-CM | POA: Insufficient documentation

## 2018-07-13 DIAGNOSIS — E6609 Other obesity due to excess calories: Secondary | ICD-10-CM | POA: Diagnosis not present

## 2018-07-13 DIAGNOSIS — K219 Gastro-esophageal reflux disease without esophagitis: Secondary | ICD-10-CM | POA: Diagnosis not present

## 2018-07-13 MED ORDER — FAMOTIDINE 20 MG PO TABS: 20.0000 mg | ORAL_TABLET | Freq: Two times a day (BID) | ORAL | 0 refills | Status: DC

## 2018-07-13 NOTE — Patient Instructions (Signed)
Atrophic Vaginitis Atrophic vaginitis is when the tissues that line the vagina become dry and thin. This is caused by a drop in estrogen. Estrogen helps:  To keep the vagina moist.  To make a clear fluid that helps: ? To lubricate the vagina for sex. ? To protect the vagina from infection.  If the lining of the vagina is dry and thin, it may:  Make sex painful. It may also cause bleeding.  Cause a feeling of: ? Burning. ? Irritation. ? Itchiness.  Make an exam of your vagina painful. It may also cause bleeding.  Make you lose interest in sex.  Cause a burning feeling when you pee.  Make your vaginal fluid (discharge) brown or yellow.  For some women, there are no symptoms. This condition is most common in women who do not get their regular menstrual periods anymore (menopause). This often starts when a woman is 45-55 years old. Follow these instructions at home:  Take medicines only as told by your doctor. Do not use any herbal or alternative medicines unless your doctor says it is okay.  Use over-the-counter products for dryness only as told by your doctor. These include: ? Creams. ? Lubricants. ? Moisturizers.  Do not douche.  Do not use products that can make your vagina dry. These include: ? Scented feminine sprays. ? Scented tampons. ? Scented soaps.  If it hurts to have sex, tell your sexual partner. Contact a doctor if:  Your discharge looks different than normal.  Your vagina has an unusual smell.  You have new symptoms.  Your symptoms do not get better with treatment.  Your symptoms get worse. This information is not intended to replace advice given to you by your health care provider. Make sure you discuss any questions you have with your health care provider. Document Released: 01/06/2008 Document Revised: 12/26/2015 Document Reviewed: 07/11/2014 Elsevier Interactive Patient Education  2018 [employer] Inc.  

## 2018-07-14 LAB — ANTINUCLEAR ANTIBODIES, IFA: ANA TITER 1: NEGATIVE

## 2018-07-14 LAB — THYROID STIMULATING IMMUNOGLOBULIN: Thyroid Stim Immunoglobulin: <0.1 IU/L (ref 0.00–0.55)

## 2018-07-14 NOTE — Progress Notes (Signed)
Your ANA, arthritis test is negative. Thyroid antibody is neg. I suggest you cut back on your gluten intake - breads, pastas. This could possibly improve your thyroid fxn. Also, do you want to see a dermatologist for your hair loss?

## 2018-08-01 NOTE — Progress Notes (Signed)
Subjective:     Patient ID: Renee Bradley , female    DOB: 12-Apr-1946 , 72 y.o.   MRN: [id number]   Chief Complaint  Patient presents with  . Hypertension    HPI  Hypertension  This is a chronic problem. The current episode started more than 1 year ago. The problem has been gradually improving since onset. The problem is controlled. Pertinent negatives include no blurred vision, chest pain, neck pain, palpitations or shortness of breath. Risk factors for coronary artery disease include diabetes mellitus, obesity, post-menopausal state, sedentary lifestyle and dyslipidemia.   She reports compliance with meds.   Past Medical History:  Diagnosis Date  . Asthma   . Chest pressure 07/13/2012  . Diabetes mellitus without complication (HCC)   . DM (diabetes mellitus) (HCC) 07/13/2012  . HTN (hypertension) 07/13/2012  . Hypercholesterolemia   . Hypertension   . SOB (shortness of breath) 07/13/2012  . Tachycardia, unspecified, "fluttering" 07/13/2012     Family History  Problem Relation Age of Onset  . Heart attack Mother   . Diabetes type II Mother   . Stroke Mother   . Alzheimer's disease Father   . Diabetes type II Brother      Current Outpatient Medications:  .  amLODipine (NORVASC) 5 MG tablet, , Disp: , Rfl:  .  aspirin EC 81 MG tablet, Take 81 mg by mouth daily., Disp: , Rfl:  .  atenolol (TENORMIN) 50 MG tablet, Take 100 mg by mouth daily., Disp: , Rfl:  .  atorvastatin (LIPITOR) 20 MG tablet, Take 20 mg by mouth at bedtime. , Disp: , Rfl:  .  ergocalciferol (VITAMIN D2) 50000 units capsule, Take 50,000 Units by mouth once a week., Disp: , Rfl:  .  furosemide (LASIX) 20 MG tablet, TAKE 1 TABLET DAILY, Disp: 90 tablet, Rfl: 2 .  Lancets (ONETOUCH DELICA PLUS LANCET33G) MISC, , Disp: , Rfl:  .  ONETOUCH VERIO test strip, , Disp: , Rfl:  .  famotidine (PEPCID) 20 MG tablet, Take 1 tablet (20 mg total) by mouth 2 (two) times daily., Disp: 60 tablet, Rfl: 0   Allergies   Allergen Reactions  . Janumet [Sitagliptin-Metformin Hcl] Shortness Of Breath  . Januvia [Sitagliptin] Shortness Of Breath  . Kombiglyze [Saxagliptin-Metformin Er] Shortness Of Breath  . Sulfur Itching     Review of Systems  Constitutional: Negative.   Eyes: Negative for blurred vision.  Respiratory: Negative.  Negative for shortness of breath.   Cardiovascular: Negative.  Negative for chest pain and palpitations.  Gastrointestinal: Negative.   Genitourinary: Negative.   Musculoskeletal: Negative for neck pain.  Skin:       She c/o hair loss.  Thinks it is due to alopecia.   Neurological: Negative.   Psychiatric/Behavioral: Negative.      Today's Vitals   07/13/18 1416  BP: (!) 154/86  Pulse: (!) 58  Temp: 98 F (36.7 C)  TempSrc: Oral  Weight: 194 lb (88 kg)  Height: 5\' 1"  (1.549 m)  PainSc: 0-No pain   Body mass index is 36.66 kg/m.   Objective:  Physical Exam Vitals signs and nursing note reviewed.  Constitutional:      Appearance: Normal appearance. She is obese.  HENT:     Head: Normocephalic and atraumatic.  Neck:     Musculoskeletal: Normal range of motion.  Cardiovascular:     Rate and Rhythm: Normal rate and regular rhythm.     Heart sounds: Normal heart sounds.  Pulmonary:  Effort: Pulmonary effort is normal.     Breath sounds: Normal breath sounds.  Skin:    General: Skin is warm.     Comments: There is some hair loss on her scalp. There is large area of hair thinning.   Neurological:     General: No focal deficit present.     Mental Status: She is alert.  Psychiatric:        Mood and Affect: Mood normal.         Assessment And Plan:     1. Hypertensive nephropathy  Uncontrolled due to non-compliance with medication. She is encouraged to take meds as prescribed. She is also encouraged to avoid adding salt to her foods.  2. Chronic renal disease, stage II  Chronic. I will check a GFR, Cr today. She is encouraged to stay well  hydrated.   3. Gastroesophageal reflux disease without esophagitis  She was given rx famotidine. She is also encouraged to stop eating 3 hours prior to going to bed at night. She will rto in six weeks for re-evaluation.   4. Type 2 diabetes mellitus with stage 2 chronic kidney disease, without long-term current use of insulin (HCC)  She is followed by Dr. Talmage Nap. She is seen every six months. She is encouraged to avoid sugary beverages.   5. Class 2 obesity due to excess calories without serious comorbidity with body mass index (BMI) of 36.0 to 36.9 in adult  She is encouraged to strive for BMI less than 30 to decrease cardiac risk. She is encouraged to start exercising 30 minutes five days weekly.   6. Alopecia  I will check labs as listed below. I will also refer her to Derm as requested for further evaluation.   - ANA, IFA (with reflex) - Thyroid Stimulating Immunoglobulin - Ambulatory referral to Dermatology        [name], MD

## 2018-08-10 ENCOUNTER — Other Ambulatory Visit: Payer: Self-pay | Admitting: Internal Medicine

## 2018-08-17 ENCOUNTER — Ambulatory Visit (INDEPENDENT_AMBULATORY_CARE_PROVIDER_SITE_OTHER): Payer: Medicare Other | Admitting: Internal Medicine

## 2018-08-17 ENCOUNTER — Encounter: Payer: Self-pay | Admitting: Internal Medicine

## 2018-08-17 VITALS — BP 120/86 | HR 58 | Temp 98.2°F | Ht 61.0 in | Wt 193.8 lb

## 2018-08-17 DIAGNOSIS — K219 Gastro-esophageal reflux disease without esophagitis: Secondary | ICD-10-CM

## 2018-08-17 DIAGNOSIS — I129 Hypertensive chronic kidney disease with stage 1 through stage 4 chronic kidney disease, or unspecified chronic kidney disease: Secondary | ICD-10-CM

## 2018-08-17 DIAGNOSIS — Z6836 Body mass index (BMI) 36.0-36.9, adult: Secondary | ICD-10-CM

## 2018-08-17 DIAGNOSIS — N182 Chronic kidney disease, stage 2 (mild): Secondary | ICD-10-CM

## 2018-08-17 DIAGNOSIS — E6609 Other obesity due to excess calories: Secondary | ICD-10-CM | POA: Diagnosis not present

## 2018-08-17 DIAGNOSIS — N952 Postmenopausal atrophic vaginitis: Secondary | ICD-10-CM | POA: Diagnosis not present

## 2018-08-17 DIAGNOSIS — E66812 Obesity, class 2: Secondary | ICD-10-CM

## 2018-08-17 NOTE — Patient Instructions (Signed)

## 2018-08-21 NOTE — Progress Notes (Signed)
Subjective:     Patient ID: Renee Bradley , female    DOB: 1945-10-05 , 73 y.o.   MRN: [id number]   Chief Complaint  Patient presents with  . Hypertension    HPI  She is here today for f/u htn. At her last visit, she was found to have elevated blood pressure. She reports compliance with meds. She is here today for f/u. She denies chest pain, headaches and visual disturbances.   Hypertension      Past Medical History:  Diagnosis Date  . Asthma   . Chest pressure 07/13/2012  . Diabetes mellitus without complication (HCC)   . DM (diabetes mellitus) (HCC) 07/13/2012  . HTN (hypertension) 07/13/2012  . Hypercholesterolemia   . Hypertension   . SOB (shortness of breath) 07/13/2012  . Tachycardia, unspecified, "fluttering" 07/13/2012     Family History  Problem Relation Age of Onset  . Heart attack Mother   . Diabetes type II Mother   . Stroke Mother   . Alzheimer's disease Father   . Diabetes type II Brother      Current Outpatient Medications:  .  amLODipine (NORVASC) 5 MG tablet, , Disp: , Rfl:  .  aspirin EC 81 MG tablet, Take 81 mg by mouth daily., Disp: , Rfl:  .  atenolol (TENORMIN) 50 MG tablet, Take 100 mg by mouth daily., Disp: , Rfl:  .  atorvastatin (LIPITOR) 20 MG tablet, Take 20 mg by mouth at bedtime. , Disp: , Rfl:  .  ergocalciferol (VITAMIN D2) 50000 units capsule, Take 50,000 Units by mouth once a week., Disp: , Rfl:  .  famotidine (PEPCID) 20 MG tablet, TAKE 1 TABLET BY MOUTH TWICE A DAY, Disp: 60 tablet, Rfl: 4 .  furosemide (LASIX) 20 MG tablet, TAKE 1 TABLET DAILY, Disp: 90 tablet, Rfl: 2 .  Lancets (ONETOUCH DELICA PLUS LANCET33G) MISC, , Disp: , Rfl:  .  ONETOUCH VERIO test strip, , Disp: , Rfl:    Allergies  Allergen Reactions  . Janumet [Sitagliptin-Metformin Hcl] Shortness Of Breath  . Januvia [Sitagliptin] Shortness Of Breath  . Kombiglyze [Saxagliptin-Metformin Er] Shortness Of Breath  . Sulfur Itching     Review of Systems   Constitutional: Negative.   Respiratory: Negative.   Cardiovascular: Negative.   Gastrointestinal: Negative.   Neurological: Negative.   Psychiatric/Behavioral: Negative.      Today's Vitals   08/17/18 1512  BP: 120/86  Pulse: (!) 58  Temp: 98.2 F (36.8 C)  TempSrc: Oral  Weight: 193 lb 12.8 oz (87.9 kg)  Height: 5\' 1"  (1.549 m)  PainSc: 0-No pain   Body mass index is 36.62 kg/m.   Objective:  Physical Exam Vitals signs and nursing note reviewed.  Constitutional:      Appearance: Normal appearance. She is obese.  HENT:     Head: Normocephalic and atraumatic.  Cardiovascular:     Rate and Rhythm: Normal rate and regular rhythm.     Heart sounds: Normal heart sounds.  Pulmonary:     Effort: Pulmonary effort is normal.     Breath sounds: Normal breath sounds.  Skin:    General: Skin is warm.  Neurological:     Mental Status: She is alert.  Psychiatric:        Mood and Affect: Mood normal.         Assessment And Plan:     1. Hypertensive nephropathy  Well controlled.  She will continue with current meds. She is encouraged to avoid  adding salt to her foods.   2. Chronic renal disease, stage II  Chronic.   3. Gastroesophageal reflux disease without esophagitis  She has had some improvement in her sx with use of famotidine. She will continue this for another 2-3 months to help promote healing. She is encouraged to avoid spicy foods.   4. Atrophic vaginitis  She is hesitant to use vaginal preparation of anything - estrogen or other options at this time. She will let me know if she changes her mind.   5. Class 2 obesity due to excess calories without serious comorbidity with body mass index (BMI) of 36.0 to 36.9 in adult  She is encouraged to strive for BMI less than 30 to decrease cardiac risk. She is advised to walk 30 minutes five days weekly. She is also encouraged to avoid sugary beverages.   [name], MD

## 2018-09-07 ENCOUNTER — Encounter: Payer: Self-pay | Admitting: Cardiovascular Disease

## 2018-09-07 ENCOUNTER — Ambulatory Visit (INDEPENDENT_AMBULATORY_CARE_PROVIDER_SITE_OTHER): Payer: Medicare Other | Admitting: Cardiovascular Disease

## 2018-09-07 VITALS — BP 154/86 | HR 52 | Ht 61.0 in | Wt 193.4 lb

## 2018-09-07 DIAGNOSIS — E782 Mixed hyperlipidemia: Secondary | ICD-10-CM

## 2018-09-07 DIAGNOSIS — I10 Essential (primary) hypertension: Secondary | ICD-10-CM | POA: Diagnosis not present

## 2018-09-07 NOTE — Patient Instructions (Signed)
Medication Instructions:  NONE If you need a refill on your cardiac medications before your next appointment, please call your pharmacy.   Lab work: NONE If you have labs (blood work) drawn today and your tests are completely normal, you will receive your results only by: Marland Kitchen MyChart Message (if you have MyChart) OR . A paper copy in the mail If you have any lab test that is abnormal or we need to change your treatment, we will call you to review the results.  Testing/Procedures: NONE  Follow-Up: At [facility], you and your health needs are our priority.  As part of our continuing mission to provide you with exceptional heart care, we have created designated Provider Care Teams.  These Care Teams include your primary Cardiologist (physician) and Advanced Practice Providers (APPs -  Physician Assistants and Nurse Practitioners) who all work together to provide you with the care you need, when you need it. . You MAY a follow up appointment AS NEEDED. You may see Dr. Allyson Sabal or one of the following Advanced Practice Providers on your designated Care Team:   . Corine Shelter, [place] . Azalee Course, PA-C . Micah Flesher, PA-C . Joni Reining, DNP . Theodore Demark, PA-C . Judy Pimple, PA-C . Marjie Skiff, PA-C

## 2018-09-07 NOTE — Assessment & Plan Note (Signed)
History of hyperlipidemia on atorvastatin with lipid profile performed 06/14/2018 revealing total cholesterol 192, LDL 114 and HDL of 59.

## 2018-09-07 NOTE — Assessment & Plan Note (Signed)
History of essential hypertension with blood pressure measured today 154/86.  She is on amlodipine and atenolol.

## 2018-09-07 NOTE — Progress Notes (Signed)
09/07/2018 Renee Bradley   1946/06/04  [id number]  Primary Physician Dorothyann Peng, MD Primary Cardiologist: Runell Gess MD Nicholes Calamity, [place]  HPI:  Renee Bradley is a 73 y.o.  moderately overweight married [place] American female, mother of 2, grandmother of 2 grandchildren ho I last saw in the office 12/31/2015.Marland Kitchen She was hospitalized, July 15, 2012, with shortness of breath. She had had lower extremity Dopplers performed at Dr. Mathews Robinsons office just prior to that. CT angiogram was negative. She was ultimately discharged home. Her risk factors for ischemic heart disease include remote tobacco abuse, having quit 35 years ago, and treated diabetes, hypertension, and hyperlipidemia. There is no family history of heart disease. She has never had a heart attack or stroke. Since her admission, she denies chest pain or shortness of breath. Her surgical history is remarkable for a remote hysterectomy in 1989. She wore an event monitor, which showed sinus rhythm, and a Lexiscan Myoview showed mild inferolateral-apical ischemia. Since I saw her in the office almost 3 years ago she is remained asymptomatic.  She denies chest pain or shortness of breath.  She does have a little pain around her ankle and has some sensitivity to palpation but no edema.  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     Current Meds  Medication Sig  . amLODipine (NORVASC) 5 MG tablet   . aspirin EC 81 MG tablet Take 81 mg by mouth daily.  Marland Kitchen atenolol (TENORMIN) 50 MG tablet Take 100 mg by mouth daily.  Marland Kitchen atorvastatin (LIPITOR) 20 MG tablet Take 20 mg by mouth at bedtime.   . ergocalciferol (VITAMIN D2) 50000 units capsule Take 50,000 Units by mouth once a week.  . famotidine (PEPCID) 20 MG tablet TAKE 1 TABLET BY MOUTH TWICE A DAY  . furosemide (LASIX) 20 MG tablet TAKE 1 TABLET DAILY  . Lancets (ONETOUCH DELICA PLUS LANCET33G) MISC   . ONETOUCH VERIO test strip       Allergies  Allergen Reactions  . Janumet [Sitagliptin-Metformin Hcl] Shortness Of Breath  . Januvia [Sitagliptin] Shortness Of Breath  . Kombiglyze [Saxagliptin-Metformin Er] Shortness Of Breath  . Sulfur Itching    Social History   Socioeconomic History  . Marital status: Married    Spouse name: Not on file  . Number of children: Not on file  . Years of education: Not on file  . Highest education level: Not on file  Occupational History  . Not on file  Social Needs  . Financial resource strain: Not on file  . Food insecurity:    Worry: Not on file    Inability: Not on file  . Transportation needs:    Medical: Not on file    Non-medical: Not on file  Tobacco Use  . Smoking status: Former Smoker    Years: 2.00    Last attempt to quit: 05/13/1983    Years since quitting: 35.3  . Smokeless tobacco: Never Used  Substance and Sexual Activity  . Alcohol use: No  . Drug use: No  . Sexual activity: Not on file  Lifestyle  . Physical activity:    Days per week: Not on file    Minutes per session: Not on file  . Stress: Not on file  Relationships  . Social connections:    Talks on phone: Not on file    Gets together: Not on file    Attends religious service: Not on file  Active member of club or organization: Not on file    Attends meetings of clubs or organizations: Not on file    Relationship status: Not on file  . Intimate partner violence:    Fear of current or ex partner: Not on file    Emotionally abused: Not on file    Physically abused: Not on file    Forced sexual activity: Not on file  Other Topics Concern  . Not on file  Social History Narrative  . Not on file     Review of Systems: General: negative for chills, fever, night sweats or weight changes.  Cardiovascular: negative for chest pain, dyspnea on exertion, edema, orthopnea, palpitations, paroxysmal nocturnal dyspnea or shortness of breath Dermatological: negative for rash Respiratory:  negative for cough or wheezing Urologic: negative for hematuria Abdominal: negative for nausea, vomiting, diarrhea, bright red blood per rectum, melena, or hematemesis Neurologic: negative for visual changes, syncope, or dizziness All other systems reviewed and are otherwise negative except as noted above.    Blood pressure (!) 154/86, pulse (!) 52, height 5\' 1"  (1.549 m), weight 193 lb 6.4 oz (87.7 kg).  General appearance: alert and no distress Neck: no adenopathy, no carotid bruit, no JVD, supple, symmetrical, trachea midline and thyroid not enlarged, symmetric, no tenderness/mass/nodules Lungs: clear to auscultation bilaterally Heart: regular rate and rhythm, S1, S2 normal, no murmur, click, rub or gallop Extremities: extremities normal, atraumatic, no cyanosis or edema Pulses: 2+ and symmetric Skin: Skin color, texture, turgor normal. No rashes or lesions Neurologic: Alert and oriented X 3, normal strength and tone. Normal symmetric reflexes. Normal coordination and gait  EKG sinus bradycardia at 52 with incomplete right bundle branch block and left axis deviation.  I personally reviewed this EKG.  ASSESSMENT AND PLAN:   HTN (hypertension) History of essential hypertension with blood pressure measured today 154/86.  She is on amlodipine and atenolol.  Hyperlipidemia History of hyperlipidemia on atorvastatin with lipid profile performed 06/14/2018 revealing total cholesterol 192, LDL 114 and HDL of 59.      Runell Gess MD FACP,FACC,FAHA, [facility] 09/07/2018 3:29 PM

## 2018-09-14 ENCOUNTER — Other Ambulatory Visit: Payer: Self-pay | Admitting: Internal Medicine

## 2018-09-22 DIAGNOSIS — E1165 Type 2 diabetes mellitus with hyperglycemia: Secondary | ICD-10-CM | POA: Diagnosis not present

## 2018-09-22 DIAGNOSIS — E7800 Pure hypercholesterolemia, unspecified: Secondary | ICD-10-CM | POA: Diagnosis not present

## 2018-09-22 DIAGNOSIS — I10 Essential (primary) hypertension: Secondary | ICD-10-CM | POA: Diagnosis not present

## 2018-11-01 DIAGNOSIS — E7800 Pure hypercholesterolemia, unspecified: Secondary | ICD-10-CM | POA: Diagnosis not present

## 2018-11-01 DIAGNOSIS — E1165 Type 2 diabetes mellitus with hyperglycemia: Secondary | ICD-10-CM | POA: Diagnosis not present

## 2018-11-01 DIAGNOSIS — I10 Essential (primary) hypertension: Secondary | ICD-10-CM | POA: Diagnosis not present

## 2019-01-11 ENCOUNTER — Other Ambulatory Visit: Payer: Self-pay | Admitting: Internal Medicine

## 2019-02-02 ENCOUNTER — Other Ambulatory Visit: Payer: Self-pay

## 2019-02-02 ENCOUNTER — Encounter: Payer: Self-pay | Admitting: Internal Medicine

## 2019-02-02 ENCOUNTER — Ambulatory Visit (INDEPENDENT_AMBULATORY_CARE_PROVIDER_SITE_OTHER): Payer: Medicare Other

## 2019-02-02 ENCOUNTER — Ambulatory Visit (INDEPENDENT_AMBULATORY_CARE_PROVIDER_SITE_OTHER): Payer: Medicare Other | Admitting: Internal Medicine

## 2019-02-02 VITALS — BP 142/70 | HR 57 | Temp 98.4°F | Ht 62.0 in | Wt 192.8 lb

## 2019-02-02 DIAGNOSIS — E1122 Type 2 diabetes mellitus with diabetic chronic kidney disease: Secondary | ICD-10-CM | POA: Diagnosis not present

## 2019-02-02 DIAGNOSIS — Z0000 Encounter for general adult medical examination without abnormal findings: Secondary | ICD-10-CM | POA: Diagnosis not present

## 2019-02-02 DIAGNOSIS — R002 Palpitations: Secondary | ICD-10-CM | POA: Diagnosis not present

## 2019-02-02 DIAGNOSIS — N182 Chronic kidney disease, stage 2 (mild): Secondary | ICD-10-CM

## 2019-02-02 DIAGNOSIS — R0989 Other specified symptoms and signs involving the circulatory and respiratory systems: Secondary | ICD-10-CM

## 2019-02-02 DIAGNOSIS — Z6835 Body mass index (BMI) 35.0-35.9, adult: Secondary | ICD-10-CM | POA: Diagnosis not present

## 2019-02-02 DIAGNOSIS — Z79899 Other long term (current) drug therapy: Secondary | ICD-10-CM

## 2019-02-02 DIAGNOSIS — I129 Hypertensive chronic kidney disease with stage 1 through stage 4 chronic kidney disease, or unspecified chronic kidney disease: Secondary | ICD-10-CM | POA: Diagnosis not present

## 2019-02-02 LAB — POCT URINALYSIS DIPSTICK
Bilirubin, UA: NEGATIVE
Blood, UA: NEGATIVE
Glucose, UA: NEGATIVE
Ketones, UA: NEGATIVE
Leukocytes, UA: NEGATIVE
Nitrite, UA: NEGATIVE
Protein, UA: NEGATIVE
Spec Grav, UA: 1.025 (ref 1.010–1.025)
Urobilinogen, UA: 0.2 E.U./dL
pH, UA: 5 (ref 5.0–8.0)

## 2019-02-02 LAB — POCT UA - MICROALBUMIN
Albumin/Creatinine Ratio, Urine, POC: <30
Creatinine, POC: 200 mg/dL
Microalbumin Ur, POC: 30 mg/L

## 2019-02-02 MED ORDER — FUROSEMIDE 20 MG PO TABS: 20.0000 mg | ORAL_TABLET | Freq: Every day | ORAL | 2 refills | Status: DC

## 2019-02-02 NOTE — Patient Instructions (Signed)
Exercises To Do While Sitting  Exercises that you do while sitting (chair exercises) can give you many of the same benefits as full exercise. Benefits include strengthening your heart, burning calories, and keeping muscles and joints healthy. Exercise can also improve your mood and help with depression and anxiety. You may benefit from chair exercises if you are unable to do standing exercises because of:  Diabetic foot pain.  Obesity.  Illness.  Arthritis.  Recovery from surgery or injury.  Breathing problems.  Balance problems.  Another type of disability. Before starting chair exercises, check with your health care provider or a physical therapist to find out how much exercise you can tolerate and which exercises are safe for you. If your health care provider approves:  Start out slowly and build up over time. Aim to work up to about 10-20 minutes for each exercise session.  Make exercise part of your daily routine.  Drink water when you exercise. Do not wait until you are thirsty. Drink every 10-15 minutes.  Stop exercising right away if you have pain, nausea, shortness of breath, or dizziness.  If you are exercising in a wheelchair, make sure to lock the wheels.  Ask your health care provider whether you can do tai chi or yoga. Many positions in these mind-body exercises can be modified to do while seated. Warm-up Before starting other exercises: 1. Sit up as straight as you can. Have your knees bent at 90 degrees, which is the shape of the capital letter "L." Keep your feet flat on the floor. 2. Sit at the front edge of your chair, if you can. 3. Pull in (tighten) the muscles in your abdomen and stretch your spine and neck as straight as you can. Hold this position for a few minutes. 4. Breathe in and out evenly. Try to concentrate on your breathing, and relax your mind. Stretching Exercise A: Arm stretch 1. Hold your arms out straight in front of your body. 2. Bend  your hands at the wrist with your fingers pointing up, as if signaling someone to stop. Notice the slight tension in your forearms as you hold the position. 3. Keeping your arms out and your hands bent, rotate your hands outward as far as you can and hold this stretch. Aim to have your thumbs pointing up and your pinkie fingers pointing down. Slowly repeat arm stretches for one minute as tolerated. Exercise B: Leg stretch 1. If you can move your legs, try to "draw" letters on the floor with the toes of your foot. Write your name with one foot. 2. Write your name with the toes of your other foot. Slowly repeat the movements for one minute as tolerated. Exercise C: Reach for the sky 1. Reach your hands as far over your head as you can to stretch your spine. 2. Move your hands and arms as if you are climbing a rope. Slowly repeat the movements for one minute as tolerated. Range of motion exercises Exercise A: Shoulder roll 1. Let your arms hang loosely at your sides. 2. Lift just your shoulders up toward your ears, then let them relax back down. 3. When your shoulders feel loose, rotate your shoulders in backward and forward circles. Do shoulder rolls slowly for one minute as tolerated. Exercise B: March in place 1. As if you are marching, pump your arms and lift your legs up and down. Lift your knees as high as you can. ? If you are unable to lift your knees,  just pump your arms and move your ankles and feet up and down. March in place for one minute as tolerated. Exercise C: Seated jumping jacks 1. Let your arms hang down straight. 2. Keeping your arms straight, lift them up over your head. Aim to point your fingers to the ceiling. 3. While you lift your arms, straighten your legs and slide your heels along the floor to your sides, as wide as you can. 4. As you bring your arms back down to your sides, slide your legs back together. ? If you are unable to use your legs, just move your arms.  Slowly repeat seated jumping jacks for one minute as tolerated. Strengthening exercises Exercise A: Shoulder squeeze 1. Hold your arms straight out from your body to your sides, with your elbows bent and your fists pointed at the ceiling. 2. Keeping your arms in the bent position, move them forward so your elbows and forearms meet in front of your face. 3. Open your arms back out as wide as you can with your elbows still bent, until you feel your shoulder blades squeezing together. Hold for 5 seconds. Slowly repeat the movements forward and backward for one minute as tolerated. Contact a health care provider if you:  Had to stop exercising due to any of the following: ? Pain. ? Nausea. ? Shortness of breath. ? Dizziness. ? Fatigue.  Have significant pain or soreness after exercising. Get help right away if you have:  Chest pain.  Difficulty breathing. These symptoms may represent a serious problem that is an emergency. Do not wait to see if the symptoms will go away. Get medical help right away. Call your local emergency services (911 in the [place] Do not drive yourself to the hospital. This information is not intended to replace advice given to you by your health care provider. Make sure you discuss any questions you have with your health care provider. Document Released: 06/02/2017 Document Revised: 11/10/2018 Document Reviewed: 06/02/2017 Elsevier Patient Education  2020 [employer].

## 2019-02-02 NOTE — Patient Instructions (Addendum)
[name] , Thank you for taking time to come for your Medicare Wellness Visit. I appreciate your ongoing commitment to your health goals. Please review the following plan we discussed and let me know if I can assist you in the future.   Screening recommendations/referrals: Colonoscopy: declines at this time Mammogram: patient to schedule Bone Density: 09/2017 Recommended yearly ophthalmology/optometry visit for glaucoma screening and checkup Recommended yearly dental visit for hygiene and checkup  Vaccinations: Influenza vaccine: 06/2018 Pneumococcal vaccine: 06/2011 Tdap vaccine: 07/2013 Shingles vaccine: discussed    Advanced directives: Advance directive discussed with you today. I have provided a copy for you to complete at home and have notarized. Once this is complete please bring a copy in to our office so we can scan it into your chart.   Conditions/risks identified: obesity  Next appointment: 08/07/2019 at 2:30   Preventive Care 72 Years and Older, Female Preventive care refers to lifestyle choices and visits with your health care provider that can promote health and wellness. What does preventive care include?  A yearly physical exam. This is also called an annual well check.  Dental exams once or twice a year.  Routine eye exams. Ask your health care provider how often you should have your eyes checked.  Personal lifestyle choices, including:  Daily care of your teeth and gums.  Regular physical activity.  Eating a healthy diet.  Avoiding tobacco and drug use.  Limiting alcohol use.  Practicing safe sex.  Taking low-dose aspirin every day.  Taking vitamin and mineral supplements as recommended by your health care provider. What happens during an annual well check? The services and screenings done by your health care provider during your annual well check will depend on your age, overall health, lifestyle risk factors, and family history of disease.  Counseling  Your health care provider may ask you questions about your:  Alcohol use.  Tobacco use.  Drug use.  Emotional well-being.  Home and relationship well-being.  Sexual activity.  Eating habits.  History of falls.  Memory and ability to understand (cognition).  Work and work Statistician.  Reproductive health. Screening  You may have the following tests or measurements:  Height, weight, and BMI.  Blood pressure.  Lipid and cholesterol levels. These may be checked every 5 years, or more frequently if you are over 28 years old.  Skin check.  Lung cancer screening. You may have this screening every year starting at age 41 if you have a 30-pack-year history of smoking and currently smoke or have quit within the past 15 years.  Fecal occult blood test (FOBT) of the stool. You may have this test every year starting at age 31.  Flexible sigmoidoscopy or colonoscopy. You may have a sigmoidoscopy every 5 years or a colonoscopy every 10 years starting at age 51.  Hepatitis C blood test.  Hepatitis B blood test.  Sexually transmitted disease (STD) testing.  Diabetes screening. This is done by checking your blood sugar (glucose) after you have not eaten for a while (fasting). You may have this done every 1-3 years.  Bone density scan. This is done to screen for osteoporosis. You may have this done starting at age 51.  Mammogram. This may be done every 1-2 years. Talk to your health care provider about how often you should have regular mammograms. Talk with your health care provider about your test results, treatment options, and if necessary, the need for more tests. Vaccines  Your health care provider may recommend  certain vaccines, such as:  Influenza vaccine. This is recommended every year.  Tetanus, diphtheria, and acellular pertussis (Tdap, Td) vaccine. You may need a Td booster every 10 years.  Zoster vaccine. You may need this after age 1.   Pneumococcal 13-valent conjugate (PCV13) vaccine. One dose is recommended after age 33.  Pneumococcal polysaccharide (PPSV23) vaccine. One dose is recommended after age 47. Talk to your health care provider about which screenings and vaccines you need and how often you need them. This information is not intended to replace advice given to you by your health care provider. Make sure you discuss any questions you have with your health care provider. Document Released: 08/16/2015 Document Revised: 04/08/2016 Document Reviewed: 05/21/2015 Elsevier Interactive Patient Education  2017 [place] Prevention in the Home Falls can cause injuries. They can happen to people of all ages. There are many things you can do to make your home safe and to help prevent falls. What can I do on the outside of my home?  Regularly fix the edges of walkways and driveways and fix any cracks.  Remove anything that might make you trip as you walk through a door, such as a raised step or threshold.  Trim any bushes or trees on the path to your home.  Use bright outdoor lighting.  Clear any walking paths of anything that might make someone trip, such as rocks or tools.  Regularly check to see if handrails are loose or broken. Make sure that both sides of any steps have handrails.  Any raised decks and porches should have guardrails on the edges.  Have any leaves, snow, or ice cleared regularly.  Use sand or salt on walking paths during winter.  Clean up any spills in your garage right away. This includes oil or grease spills. What can I do in the bathroom?  Use night lights.  Install grab bars by the toilet and in the tub and shower. Do not use towel bars as grab bars.  Use non-skid mats or decals in the tub or shower.  If you need to sit down in the shower, use a plastic, non-slip stool.  Keep the floor dry. Clean up any water that spills on the floor as soon as it happens.  Remove soap  buildup in the tub or shower regularly.  Attach bath mats securely with double-sided non-slip rug tape.  Do not have throw rugs and other things on the floor that can make you trip. What can I do in the bedroom?  Use night lights.  Make sure that you have a light by your bed that is easy to reach.  Do not use any sheets or blankets that are too big for your bed. They should not hang down onto the floor.  Have a firm chair that has side arms. You can use this for support while you get dressed.  Do not have throw rugs and other things on the floor that can make you trip. What can I do in the kitchen?  Clean up any spills right away.  Avoid walking on wet floors.  Keep items that you use a lot in easy-to-reach places.  If you need to reach something above you, use a strong step stool that has a grab bar.  Keep electrical cords out of the way.  Do not use floor polish or wax that makes floors slippery. If you must use wax, use non-skid floor wax.  Do not have throw rugs and other  things on the floor that can make you trip. What can I do with my stairs?  Do not leave any items on the stairs.  Make sure that there are handrails on both sides of the stairs and use them. Fix handrails that are broken or loose. Make sure that handrails are as long as the stairways.  Check any carpeting to make sure that it is firmly attached to the stairs. Fix any carpet that is loose or worn.  Avoid having throw rugs at the top or bottom of the stairs. If you do have throw rugs, attach them to the floor with carpet tape.  Make sure that you have a light switch at the top of the stairs and the bottom of the stairs. If you do not have them, ask someone to add them for you. What else can I do to help prevent falls?  Wear shoes that:  Do not have high heels.  Have rubber bottoms.  Are comfortable and fit you well.  Are closed at the toe. Do not wear sandals.  If you use a stepladder:  Make  sure that it is fully opened. Do not climb a closed stepladder.  Make sure that both sides of the stepladder are locked into place.  Ask someone to hold it for you, if possible.  Clearly mark and make sure that you can see:  Any grab bars or handrails.  First and last steps.  Where the edge of each step is.  Use tools that help you move around (mobility aids) if they are needed. These include:  Canes.  Walkers.  Scooters.  Crutches.  Turn on the lights when you go into a dark area. Replace any light bulbs as soon as they burn out.  Set up your furniture so you have a clear path. Avoid moving your furniture around.  If any of your floors are uneven, fix them.  If there are any pets around you, be aware of where they are.  Review your medicines with your doctor. Some medicines can make you feel dizzy. This can increase your chance of falling. Ask your doctor what other things that you can do to help prevent falls. This information is not intended to replace advice given to you by your health care provider. Make sure you discuss any questions you have with your health care provider. Document Released: 05/16/2009 Document Revised: 12/26/2015 Document Reviewed: 08/24/2014 Elsevier Interactive Patient Education  2017 [employer].

## 2019-02-02 NOTE — Progress Notes (Signed)
Subjective:   Renee Bradley is a 73 y.o. female who presents for Medicare Annual (Subsequent) preventive examination.  Review of Systems:  n/a Cardiac Risk Factors include: advanced age (>28men, >30 women);diabetes mellitus;dyslipidemia;hypertension;obesity (BMI >30kg/m2);sedentary lifestyle     Objective:     Vitals: BP (!) 142/70 (BP Location: Left Arm, Patient Position: Sitting, Cuff Size: Large)   Pulse (!) 57   Temp 98.4 F (36.9 C) (Oral)   Ht 5\' 2"  (1.575 m)   Wt 192 lb 12.8 oz (87.5 kg)   SpO2 98%   BMI 35.26 kg/m   Body mass index is 35.26 kg/m.  Advanced Directives 02/02/2019 03/25/2015 03/03/2015 10/30/2014 09/29/2014 08/15/2014  Does Patient Have a Medical Advance Directive? No Yes No No No No  Type of Advance Directive - [place] in Chart? - Yes - - - -  Would patient like information on creating a medical advance directive? Yes (MAU/Ambulatory/Procedural Areas - Information given) - - No - patient declined information No - patient declined information No - patient declined information    Tobacco Social History   Tobacco Use  Smoking Status Former Smoker  . Years: 2.00  . Quit date: 05/13/1983  . Years since quitting: 35.7  Smokeless Tobacco Never Used     Counseling given: Not Answered   Clinical Intake:  Pre-visit preparation completed: Yes  Pain : No/denies pain     Nutritional Status: BMI > 30  Obese Nutritional Risks: None Diabetes: Yes CBG done?: No Did pt. bring in CBG monitor from home?: No  How often do you need to have someone help you when you read instructions, pamphlets, or other written materials from your doctor or pharmacy?: 1 - Never What is the last grade level you completed in school?: some college  Interpreter Needed?: No  Information entered by :: NAllen LPN  Past Medical History:  Diagnosis Date  . Asthma   . Chest pressure 07/13/2012  . Diabetes  mellitus without complication ([place])   . DM (diabetes mellitus) ([place]) 07/13/2012  . HTN (hypertension) 07/13/2012  . Hypercholesterolemia   . Hypertension   . SOB (shortness of breath) 07/13/2012  . Tachycardia, unspecified, "fluttering" 07/13/2012   Past Surgical History:  Procedure Laterality Date  . ABDOMINAL HYSTERECTOMY     Family History  Problem Relation Age of Onset  . Heart attack Mother   . Diabetes type II Mother   . Stroke Mother   . Alzheimer's disease Father   . Diabetes type II Brother    Social History   Socioeconomic History  . Marital status: Married    Spouse name: Not on file  . Number of children: Not on file  . Years of education: Not on file  . Highest education level: Not on file  Occupational History  . Occupation: retired  Scientific laboratory technician  . Financial resource strain: Not hard at all  . Food insecurity    Worry: Never true    Inability: Never true  . Transportation needs    Medical: No    Non-medical: No  Tobacco Use  . Smoking status: Former Smoker    Years: 2.00    Quit date: 05/13/1983    Years since quitting: 35.7  . Smokeless tobacco: Never Used  Substance and Sexual Activity  . Alcohol use: No  . Drug use: No  . Sexual activity: Yes  Lifestyle  . Physical activity  Days per week: 0 days    Minutes per session: 0 min  . Stress: Not at all  Relationships  . Social Musician on phone: Not on file    Gets together: Not on file    Attends religious service: Not on file    Active member of club or organization: Not on file    Attends meetings of clubs or organizations: Not on file    Relationship status: Not on file  Other Topics Concern  . Not on file  Social History Narrative  . Not on file    Outpatient Encounter Medications as of 02/02/2019  Medication Sig  . amLODipine (NORVASC) 5 MG tablet   . aspirin EC 81 MG tablet Take 81 mg by mouth daily.  Marland Kitchen atenolol (TENORMIN) 50 MG tablet TAKE 1 TABLET BY MOUTH TWICE  A DAY  . atorvastatin (LIPITOR) 20 MG tablet TAKE 1 TABLET DAILY  . ergocalciferol (VITAMIN D2) 50000 units capsule Take 50,000 Units by mouth once a week.  . famotidine (PEPCID) 20 MG tablet TAKE 1 TABLET BY MOUTH TWICE A DAY (Patient taking differently: as needed. )  . Lancets (ONETOUCH DELICA PLUS LANCET33G) MISC   . ONETOUCH VERIO test strip   . [DISCONTINUED] furosemide (LASIX) 20 MG tablet TAKE 1 TABLET DAILY   No facility-administered encounter medications on file as of 02/02/2019.     Activities of Daily Living In your present state of health, do you have any difficulty performing the following activities: 02/02/2019  Hearing? N  Vision? N  Difficulty concentrating or making decisions? N  Walking or climbing stairs? Y  Comment steep steps due to left knee  Dressing or bathing? N  Doing errands, shopping? N  Preparing Food and eating ? N  Using the Toilet? N  In the past six months, have you accidently leaked urine? Y  Comment sometimes when laughs or has a lot of water  Do you have problems with loss of bowel control? N  Managing your Medications? N  Managing your Finances? N  Housekeeping or managing your Housekeeping? N  Some recent data might be hidden    Patient Care Team: Dorothyann Peng, MD as PCP - General (Internal Medicine)    Assessment:   This is a routine wellness examination for Renee Bradley.  Exercise Activities and Dietary recommendations Current Exercise Habits: The patient does not participate in regular exercise at present  Goals    . Patient Stated     Keep doing the best she can       Fall Risk Fall Risk  02/02/2019 08/17/2018 07/13/2018 06/14/2018 07/08/2017  Falls in the past year? 0 0 0 0 No  Comment - - - - Emmi Telephone Survey: data to providers prior to load  Risk for fall due to : Medication side effect - - - -  Follow up Falls evaluation completed;Education provided;Falls prevention discussed - - - -   Is the patient's home free of loose  throw rugs in walkways, pet beds, electrical cords, etc?   yes      Grab bars in the bathroom? yes      Handrails on the stairs?   yes      Adequate lighting?   yes  Timed Get Up and Go performed: n/a  Depression Screen PHQ 2/9 Scores 02/02/2019 08/17/2018 07/13/2018 06/14/2018  PHQ - 2 Score 0 0 0 0  PHQ- 9 Score 0 - - -     Cognitive Function  6CIT Screen 02/02/2019  What Year? 0 points  What month? 0 points  What time? 0 points  Count back from 20 0 points  Months in reverse 0 points  Repeat phrase 0 points  Total Score 0    Immunization History  Administered Date(s) Administered  . DTaP 07/11/2013  . Influenza, High Dose Seasonal PF 06/14/2018    Qualifies for Shingles Vaccine? yes  Screening Tests Health Maintenance  Topic Date Due  . OPHTHALMOLOGY EXAM  02/09/1957  . HEMOGLOBIN A1C  12/13/2018  . COLONOSCOPY  02/02/2020 (Originally 02/09/1997)  . Hepatitis C Screening  02/02/2020 (Originally 01/03/1946)  . PNA vac Low Risk Adult (1 of 2 - PCV13) 02/02/2020 (Originally 02/10/2012)  . INFLUENZA VACCINE  03/04/2019  . MAMMOGRAM  09/28/2019  . FOOT EXAM  02/02/2020  . URINE MICROALBUMIN  02/02/2020  . TETANUS/TDAP  07/12/2023  . DEXA SCAN  Completed    Cancer Screenings: Lung: Low Dose CT Chest recommended if Age 51-80 years, 30 pack-year currently smoking OR have quit w/in 15years. Patient does not qualify. Breast:  Up to date on Mammogram? No   Up to date of Bone Density/Dexa? Yes Colorectal: declines  Additional Screenings: : Hepatitis C Screening: declines     Plan:    6 CIT was 0. Declines colonoscopy at this time. States she will schedule mammogram. States she will be making eye appointment   I have personally reviewed and noted the following in the patient's chart:   . Medical and social history . Use of alcohol, tobacco or illicit drugs  . Current medications and supplements . Functional ability and status . Nutritional status . Physical  activity . Advanced directives . List of other physicians . Hospitalizations, surgeries, and ER visits in previous 12 months . Vitals . Screenings to include cognitive, depression, and falls . Referrals and appointments  In addition, I have reviewed and discussed with patient certain preventive protocols, quality metrics, and best practice recommendations. A written personalized care plan for preventive services as well as general preventive health recommendations were provided to patient.     Barb Merinoickeah E Allen, LPN  1/6/10967/09/2018

## 2019-02-03 LAB — LIPID PANEL
Chol/HDL Ratio: 2.7 ratio (ref 0.0–4.4)
Cholesterol, Total: 181 mg/dL (ref 100–199)
HDL: 68 mg/dL (ref >39)
LDL Calculated: 99 mg/dL (ref 0–99)
Triglycerides: 72 mg/dL (ref 0–149)
VLDL Cholesterol Cal: 14 mg/dL (ref 5–40)

## 2019-02-03 LAB — BMP8+EGFR
BUN/Creatinine Ratio: 19 (ref 12–28)
BUN: 17 mg/dL (ref 8–27)
CO2: 21 mmol/L (ref 20–29)
Calcium: 9.8 mg/dL (ref 8.7–10.3)
Chloride: 107 mmol/L — ABNORMAL HIGH (ref 96–106)
Creatinine, Ser: 0.89 mg/dL (ref 0.57–1.00)
GFR calc Af Amer: 75 mL/min/{1.73_m2} (ref >59)
GFR calc non Af Amer: 65 mL/min/{1.73_m2} (ref >59)
Glucose: 92 mg/dL (ref 65–99)
Potassium: 4.2 mmol/L (ref 3.5–5.2)
Sodium: 147 mmol/L — ABNORMAL HIGH (ref 134–144)

## 2019-02-03 LAB — HEMOGLOBIN A1C
Est. average glucose Bld gHb Est-mCnc: 128 mg/dL
Hgb A1c MFr Bld: 6.1 % — ABNORMAL HIGH (ref 4.8–5.6)

## 2019-02-03 LAB — TSH: TSH: 0.867 u[IU]/mL (ref 0.450–4.500)

## 2019-02-03 LAB — MAGNESIUM: Magnesium: 2.3 mg/dL (ref 1.6–2.3)

## 2019-02-04 NOTE — Progress Notes (Signed)
Subjective:     Patient ID: Renee Bradley , female    DOB: 08-Sep-1945 , 73 y.o.   MRN: [id number]   Chief Complaint  Patient presents with  . Diabetes  . Hypertension    HPI  She presents today for diabetes check. She is also followed by Endocrinology, Dr. [name] - however, she states she only sees her once yearly.   Diabetes She presents for her follow-up diabetic visit. She has type 2 diabetes mellitus. Her disease course has been stable. There are no hypoglycemic associated symptoms. There are no diabetic associated symptoms. Pertinent negatives for diabetes include no blurred vision and no chest pain. There are no hypoglycemic complications. Diabetic complications include nephropathy. Risk factors for coronary artery disease include diabetes mellitus, dyslipidemia, hypertension, post-menopausal, sedentary lifestyle and obesity. She is compliant with treatment some of the time. Her weight is fluctuating minimally. She is following a diabetic diet. She participates in exercise intermittently. An ACE inhibitor/angiotensin II receptor blocker is not being taken.  Hypertension This is a chronic problem. The current episode started more than 1 year ago. The problem has been gradually improving since onset. The problem is uncontrolled. Associated symptoms include palpitations. Pertinent negatives include no blurred vision or chest pain. The current treatment provides moderate improvement. Hypertensive end-organ damage includes kidney disease.     Past Medical History:  Diagnosis Date  . Asthma   . Chest pressure 07/13/2012  . Diabetes mellitus without complication ([place])   . DM (diabetes mellitus) ([place]) 07/13/2012  . HTN (hypertension) 07/13/2012  . Hypercholesterolemia   . Hypertension   . SOB (shortness of breath) 07/13/2012  . Tachycardia, unspecified, "fluttering" 07/13/2012     Family History  Problem Relation Age of Onset  . Heart attack Mother   . Diabetes type II Mother   .  Stroke Mother   . Alzheimer's disease Father   . Diabetes type II Brother      Current Outpatient Medications:  .  amLODipine (NORVASC) 5 MG tablet, , Disp: , Rfl:  .  aspirin EC 81 MG tablet, Take 81 mg by mouth daily., Disp: , Rfl:  .  atenolol (TENORMIN) 50 MG tablet, TAKE 1 TABLET BY MOUTH TWICE A DAY, Disp: 180 tablet, Rfl: 1 .  atorvastatin (LIPITOR) 20 MG tablet, TAKE 1 TABLET DAILY, Disp: 90 tablet, Rfl: 2 .  ergocalciferol (VITAMIN D2) 50000 units capsule, Take 50,000 Units by mouth once a week., Disp: , Rfl:  .  famotidine (PEPCID) 20 MG tablet, TAKE 1 TABLET BY MOUTH TWICE A DAY (Patient taking differently: as needed. ), Disp: 60 tablet, Rfl: 4 .  furosemide (LASIX) 20 MG tablet, Take 1 tablet (20 mg total) by mouth daily., Disp: 90 tablet, Rfl: 2 .  Lancets (ONETOUCH DELICA PLUS [id number]) MISC, , Disp: , Rfl:  .  ONETOUCH VERIO test strip, , Disp: , Rfl:    Allergies  Allergen Reactions  . Janumet [Sitagliptin-Metformin Hcl] Shortness Of Breath  . Januvia [Sitagliptin] Shortness Of Breath  . Kombiglyze [Saxagliptin-Metformin Er] Shortness Of Breath  . Sulfur Itching     Review of Systems  Constitutional: Negative.   Eyes: Negative for blurred vision.  Respiratory: Negative.   Cardiovascular: Positive for palpitations. Negative for chest pain.       Reports having occasional palpitations. Unable to state what triggers her sx. No associated cp, sob.   Gastrointestinal: Negative.   Neurological: Negative.   Psychiatric/Behavioral: Negative.      Today's Vitals   02/02/19  1534  BP: (!) 142/70  Pulse: (!) 57  Temp: 98.4 F (36.9 C)  TempSrc: Oral  Weight: 192 lb 12.8 oz (87.5 kg)  Height: 5' 2"  (1.575 m)  PainSc: 0-No pain   Body mass index is 35.26 kg/m.   Objective:  Physical Exam Vitals signs and nursing note reviewed.  Constitutional:      Appearance: Normal appearance.  HENT:     Head: Normocephalic and atraumatic.  Cardiovascular:     Rate and  Rhythm: Normal rate and regular rhythm.     Pulses:          Dorsalis pedis pulses are 1+ on the right side and 1+ on the left side.     Heart sounds: Normal heart sounds.  Pulmonary:     Effort: Pulmonary effort is normal.     Breath sounds: Normal breath sounds.  Musculoskeletal:     Right foot: Bunion present.     Left foot: Bunion present.  Feet:     Right foot:     Protective Sensation: 5 sites tested. 5 sites sensed.     Skin integrity: Skin integrity normal.     Left foot:     Protective Sensation: 5 sites tested. 5 sites sensed.     Skin integrity: Skin integrity normal.  Skin:    General: Skin is warm.  Neurological:     General: No focal deficit present.     Mental Status: She is alert.  Psychiatric:        Mood and Affect: Mood normal.        Behavior: Behavior normal.         Assessment And Plan:     1. Type 2 diabetes mellitus with stage 2 chronic kidney disease, without long-term current use of insulin ([place])  Diabetic foot exam performed. I will check labs as listed below and forward to Dr. [name] for her review.   - Hemoglobin A1c - BMP8+EGFR - Lipid Panel   2. Hypertensive nephropathy  Fair control. I question her compliance. She will continue with current meds. Importance of dietary AND medication AND exercise compliance was discussed with the patient.   3. Heart palpitations  I will check labs below. EKG performed, no arrhythmia noted. I will also refer her to Cardiology for further evaluation.   - TSH - Magnesium - Ambulatory referral to Cardiology - EKG 12-Lead  4. Class 2 severe obesity due to excess calories with serious comorbidity and body mass index (BMI) of 35.0 to 35.9 in adult [facility])  Importance of achieving optimal weight to decrease risk of cardiovascular disease and cancers was discussed with the patient in full detail. She is encouraged to start slowly - start with 10 minutes twice daily at least three to four days per week and to  gradually build to 30 minutes five days weekly. She was given tips to incorporate more activity into her daily routine - take stairs when possible, park farther away from grocery stores, etc.    5. Diminished pulses in lower extremity  Pt advised that cardiology will do further testing. Importance of statin compliance was discussed with the patient.   6. Drug therapy    [name], MD    THE PATIENT IS ENCOURAGED TO PRACTICE SOCIAL DISTANCING DUE TO THE COVID-19 PANDEMIC.

## 2019-02-06 ENCOUNTER — Telehealth: Payer: Self-pay

## 2019-02-06 NOTE — Telephone Encounter (Signed)
LAB RESULTS GIVEN  

## 2019-02-06 NOTE — Telephone Encounter (Signed)
-----   Message from Glendale Chard, MD sent at 02/03/2019  7:52 AM EDT ----- Your hba1c is 6.1, this is pretty good. Please fax copy of her labs to DR. Balan.  Thyroid function is normal. Kidney function is stable. However, sodium and chloride levels are elevated - this implies that she is not drinking enough water. This could be contributing to her palpitations. I would like to refer her to Cardiology for further evaluation. Does she agree to this? Cholesterol is fairly good. LDL , bad chol is 99 - should be less than 70 as a diabetic. Be sure to continue with atorvastatin.

## 2019-04-27 ENCOUNTER — Telehealth: Payer: Self-pay | Admitting: Internal Medicine

## 2019-04-27 NOTE — Chronic Care Management (AMB) (Signed)
°  Chronic Care Management   Outreach Note  04/27/2019 Name: Renee Bradley MRN: [id number] DOB: 31-Jan-1946  Referred by: Glendale Chard, MD Reason for referral : Chronic Care Management (Initial CCM outreach was unsuccessful. )   An unsuccessful telephone outreach was attempted today. The patient was referred to the case management team by for assistance with chronic care management and care coordination.   Follow Up Plan: A HIPPA compliant phone message was left for the patient providing contact information and requesting a return call.  The care management team will reach out to the patient again over the next 7 days.  If patient returns call to provider office, please advise to call [place] at [place]  [email or web address]   ??[phone]

## 2019-05-01 NOTE — Chronic Care Management (AMB) (Signed)
°  Chronic Care Management   Outreach Note  05/01/2019 Name: Renee Bradley MRN: [id number] DOB: Jun 27, 1946  Referred by: Glendale Chard, MD Reason for referral : Chronic Care Management (Initial CCM outreach was unsuccessful. ) and Chronic Care Management (Second CCM outreach was unsuccessful)   A second unsuccessful telephone outreach was attempted today. The patient was referred to the case management team for assistance with care management and care coordination.   Follow Up Plan: A HIPPA compliant phone message was left for the patient providing contact information and requesting a return call.  The care management team will reach out to the patient again over the next 7 days.  If patient returns call to provider office, please advise to call [place] at [place]  [email or web address]   ??[phone]

## 2019-05-07 ENCOUNTER — Other Ambulatory Visit: Payer: Self-pay | Admitting: Internal Medicine

## 2019-05-08 ENCOUNTER — Other Ambulatory Visit: Payer: Self-pay

## 2019-05-08 MED ORDER — FUROSEMIDE 20 MG PO TABS: 20.0000 mg | ORAL_TABLET | Freq: Every day | ORAL | 2 refills | Status: DC

## 2019-05-10 NOTE — Chronic Care Management (AMB) (Signed)
Chronic Care Management   Note  05/10/2019 Name: Renee Bradley MRN: [id number] DOB: 1946/03/30  Renee Bradley is a 73 y.o. year old female who is a primary care patient of Glendale Chard, MD. I reached out to [place] by phone today in response to a referral sent by Renee Bradley health plan.     Renee Bradley was given information about Chronic Care Management services today including:  1. CCM service includes personalized support from designated clinical staff supervised by her physician, including individualized plan of care and coordination with other care providers 2. 24/7 contact phone numbers for assistance for urgent and routine care needs. 3. Service will only be billed when office clinical staff spend 20 minutes or more in a month to coordinate care. 4. Only one practitioner may furnish and bill the service in a calendar month. 5. The patient may stop CCM services at any time (effective at the end of the month) by phone call to the office staff. 6. The patient will be responsible for cost sharing (co-pay) of up to 20% of the service fee (after annual deductible is met).  Patient did not agree to enrollment in care management services and does not wish to consider at this time.  Follow up plan: The patient has been provided with contact information for the chronic care management team and has been advised to call with any health related questions or concerns.   [place]  [email or web address]   ??[phone]

## 2019-05-16 ENCOUNTER — Other Ambulatory Visit: Payer: Self-pay

## 2019-05-16 ENCOUNTER — Telehealth: Payer: Self-pay

## 2019-05-16 ENCOUNTER — Ambulatory Visit (INDEPENDENT_AMBULATORY_CARE_PROVIDER_SITE_OTHER): Payer: Medicare Other

## 2019-05-16 VITALS — BP 128/76 | HR 58 | Temp 98.4°F | Ht 62.0 in | Wt 197.4 lb

## 2019-05-16 DIAGNOSIS — Z23 Encounter for immunization: Secondary | ICD-10-CM | POA: Diagnosis not present

## 2019-05-16 NOTE — Progress Notes (Signed)
Flu shot

## 2019-05-16 NOTE — Telephone Encounter (Signed)
Pt stopped in for flu shot she is taking furosemide she is stating the pharmacy gave her a different brand and she believes it is causing her blood sugars to rise. She stated she spoke with you already about this should she be taking her lipitor? She has not been taking her lipitor because her levels has been high from the furosemide

## 2019-05-31 ENCOUNTER — Telehealth: Payer: Self-pay

## 2019-05-31 NOTE — Telephone Encounter (Signed)
The pt said that she is taking furosemide and that the pharmacy gave her a different brand and she believes it is causing her blood sugars to rise. She asked the pharmacy to give her a different kind of furosemide and the pharmacy said she needed to speak with her doctor. She  Said she also hasn't been taking her lipitor because her levels has been high from the furosemide and that the Lipitor can cause her blood sugars to rise too.  The pt was told that I would let Dr. Baird Cancer know when she returns tomorrow.

## 2019-06-12 ENCOUNTER — Telehealth: Payer: Self-pay

## 2019-06-12 NOTE — Telephone Encounter (Signed)
The pt left a message that she hadn't been taking her cholesterol atorvastatin  med or her furosemide because they cause her  Blood sugars to rise.  The pt was scheduled an appt.

## 2019-06-19 ENCOUNTER — Other Ambulatory Visit: Payer: Self-pay

## 2019-06-19 ENCOUNTER — Encounter: Payer: Self-pay | Admitting: Nurse Practitioner

## 2019-06-19 ENCOUNTER — Ambulatory Visit (INDEPENDENT_AMBULATORY_CARE_PROVIDER_SITE_OTHER): Payer: Medicare Other | Admitting: Nurse Practitioner

## 2019-06-19 VITALS — BP 142/78 | HR 55 | Temp 98.5°F | Ht 62.0 in | Wt 198.8 lb

## 2019-06-19 DIAGNOSIS — E782 Mixed hyperlipidemia: Secondary | ICD-10-CM

## 2019-06-19 DIAGNOSIS — N182 Chronic kidney disease, stage 2 (mild): Secondary | ICD-10-CM

## 2019-06-19 DIAGNOSIS — E1122 Type 2 diabetes mellitus with diabetic chronic kidney disease: Secondary | ICD-10-CM

## 2019-06-19 NOTE — Telephone Encounter (Signed)
pls advise her to take meds as prescribed.

## 2019-06-19 NOTE — Progress Notes (Signed)
Subjective:     Patient ID: Renee Bradley , female    DOB: 04-Nov-1945 , 73 y.o.   MRN: [id number]   Chief Complaint  Patient presents with  . med check    patient stated she had a question about her diuretic and she stated it was causing blood sugar to go up.    HPI  She is here today with questions about furosemide - she has been having her [facility] locally.  She is concerned because she is a diabetic and cholesterol.  She is not taking her statins. She is having ear ringing.  Negative for frequency in urination.  When she eats a peanut butter cracker her blood sugar will go up to 116, felt more intense.  She reports she is drinking minimal amount of water.    She takes her cholesterol medication in the morning instead of at bedtime feels like when she does it she eats more. She does not eat a lot of meat.  She does eat beans.      Past Medical History:  Diagnosis Date  . Asthma   . Chest pressure 07/13/2012  . Diabetes mellitus without complication ([place])   . DM (diabetes mellitus) ([place]) 07/13/2012  . HTN (hypertension) 07/13/2012  . Hypercholesterolemia   . Hypertension   . SOB (shortness of breath) 07/13/2012  . Tachycardia, unspecified, "fluttering" 07/13/2012     Family History  Problem Relation Age of Onset  . Heart attack Mother   . Diabetes type II Mother   . Stroke Mother   . Alzheimer's disease Father   . Diabetes type II Brother      Current Outpatient Medications:  .  amLODipine (NORVASC) 5 MG tablet, , Disp: , Rfl:  .  aspirin EC 81 MG tablet, Take 81 mg by mouth daily., Disp: , Rfl:  .  atenolol (TENORMIN) 50 MG tablet, TAKE 1 TABLET BY MOUTH TWICE A DAY, Disp: 180 tablet, Rfl: 1 .  ergocalciferol (VITAMIN D2) 50000 units capsule, Take 50,000 Units by mouth once a week., Disp: , Rfl:  .  furosemide (LASIX) 20 MG tablet, Take 1 tablet (20 mg total) by mouth daily., Disp: 90 tablet, Rfl: 2 .  Lancets (ONETOUCH DELICA PLUS [id number]) MISC, ,  Disp: , Rfl:  .  ONETOUCH VERIO test strip, , Disp: , Rfl:  .  atorvastatin (LIPITOR) 20 MG tablet, TAKE 1 TABLET DAILY (Patient not taking: Reported on 06/19/2019), Disp: 90 tablet, Rfl: 2 .  famotidine (PEPCID) 20 MG tablet, TAKE 1 TABLET BY MOUTH TWICE A DAY (Patient not taking: No sig reported), Disp: 60 tablet, Rfl: 4   Allergies  Allergen Reactions  . Janumet [Sitagliptin-Metformin Hcl] Shortness Of Breath  . Januvia [Sitagliptin] Shortness Of Breath  . Kombiglyze [Saxagliptin-Metformin Er] Shortness Of Breath  . Sulfur Itching     Review of Systems  Constitutional: Negative.   Respiratory: Negative.   Cardiovascular: Negative.  Negative for chest pain, palpitations and leg swelling.  Neurological: Negative for dizziness and headaches.  Psychiatric/Behavioral: Negative.      Today's Vitals   06/19/19 1508  BP: (!) 142/78  Pulse: (!) 55  Temp: 98.5 F (36.9 C)  TempSrc: Oral  Weight: 198 lb 12.8 oz (90.2 kg)  Height: 5\' 2"  (1.575 m)  PainSc: 0-No pain   Body mass index is 36.36 kg/m.   Objective:  Physical Exam Constitutional:      General: She is not in acute distress.    Appearance:  Normal appearance.  Neurological:     General: No focal deficit present.     Mental Status: She is alert and oriented to person, place, and time.         Assessment And Plan:     1. Mixed hyperlipidemia  Continue with lipitor as directed  Encouraged to take her lipitor at night  She should expect her next refill of [facility]itor [facility]  2. Type 2 diabetes mellitus with stage 2 chronic kidney disease, without long-term current use of insulin ([facility])  No labs today     Arnette Felts, FNP    THE PATIENT IS ENCOURAGED TO PRACTICE SOCIAL DISTANCING DUE TO THE COVID-19 PANDEMIC.

## 2019-06-19 NOTE — Patient Instructions (Signed)
   Encouraged to drink an ensure with protein to help with her hunger at night

## 2019-08-01 ENCOUNTER — Other Ambulatory Visit: Payer: Self-pay

## 2019-08-01 MED ORDER — ATENOLOL 50 MG PO TABS: 50.0000 mg | ORAL_TABLET | Freq: Two times a day (BID) | ORAL | 1 refills | Status: DC

## 2019-08-07 ENCOUNTER — Ambulatory Visit: Payer: Medicare Other | Admitting: Internal Medicine

## 2019-08-21 ENCOUNTER — Telehealth: Payer: Self-pay

## 2019-08-21 NOTE — Telephone Encounter (Signed)
Please advise the patient nothing has changed.  Perhaps something else is causing her sx.

## 2019-08-21 NOTE — Telephone Encounter (Signed)
I did not change her atenolol. Tell the pharmacy she wants generic.

## 2019-08-21 NOTE — Telephone Encounter (Signed)
Dr. Allyne Gee I spoke with the pharmacy they said nothing was switched she is still on the same medication.

## 2019-08-21 NOTE — Telephone Encounter (Signed)
Pt LVM she believes the atenolol (Tenormin) 50mg  is making her sick fainting spells,weak,can't breath good, and anxiety she is asking to get back on the regular atenolol she stated the tenormin is the cause

## 2019-08-22 ENCOUNTER — Telehealth: Payer: Self-pay

## 2019-08-22 ENCOUNTER — Other Ambulatory Visit: Payer: Self-pay

## 2019-08-22 MED ORDER — ATENOLOL 50 MG PO TABS: ORAL_TABLET | ORAL | 1 refills | Status: DC

## 2019-08-22 NOTE — Telephone Encounter (Signed)
Per Dr. Allyne Gee send in new script for pt atenolol 50mg   Brand name DAW Spoke w/pt told her I would send in a new script pt stated she had spoke with a customer service rep and they were going to speak with a pharmacist about replacing the current ones she have and they will reach back out to her.

## 2019-10-16 ENCOUNTER — Other Ambulatory Visit: Payer: Self-pay

## 2019-10-24 DIAGNOSIS — E1165 Type 2 diabetes mellitus with hyperglycemia: Secondary | ICD-10-CM | POA: Diagnosis not present

## 2019-10-24 DIAGNOSIS — E7800 Pure hypercholesterolemia, unspecified: Secondary | ICD-10-CM | POA: Diagnosis not present

## 2019-10-31 DIAGNOSIS — E1165 Type 2 diabetes mellitus with hyperglycemia: Secondary | ICD-10-CM | POA: Diagnosis not present

## 2019-10-31 DIAGNOSIS — I10 Essential (primary) hypertension: Secondary | ICD-10-CM | POA: Diagnosis not present

## 2019-10-31 DIAGNOSIS — E7800 Pure hypercholesterolemia, unspecified: Secondary | ICD-10-CM | POA: Diagnosis not present

## 2019-12-12 ENCOUNTER — Other Ambulatory Visit: Payer: Self-pay | Admitting: Internal Medicine

## 2019-12-30 ENCOUNTER — Other Ambulatory Visit: Payer: Self-pay | Admitting: Internal Medicine

## 2020-01-20 ENCOUNTER — Other Ambulatory Visit: Payer: Self-pay | Admitting: Internal Medicine

## 2020-02-07 ENCOUNTER — Encounter: Payer: Self-pay | Admitting: Internal Medicine

## 2020-02-07 ENCOUNTER — Other Ambulatory Visit: Payer: Self-pay

## 2020-02-07 ENCOUNTER — Ambulatory Visit (INDEPENDENT_AMBULATORY_CARE_PROVIDER_SITE_OTHER): Payer: Medicare Other | Admitting: Internal Medicine

## 2020-02-07 ENCOUNTER — Ambulatory Visit (INDEPENDENT_AMBULATORY_CARE_PROVIDER_SITE_OTHER): Payer: Medicare Other

## 2020-02-07 VITALS — BP 134/82 | HR 62 | Temp 98.8°F | Ht 62.0 in | Wt 199.8 lb

## 2020-02-07 DIAGNOSIS — Z1159 Encounter for screening for other viral diseases: Secondary | ICD-10-CM | POA: Diagnosis not present

## 2020-02-07 DIAGNOSIS — I129 Hypertensive chronic kidney disease with stage 1 through stage 4 chronic kidney disease, or unspecified chronic kidney disease: Secondary | ICD-10-CM | POA: Diagnosis not present

## 2020-02-07 DIAGNOSIS — Z0000 Encounter for general adult medical examination without abnormal findings: Secondary | ICD-10-CM | POA: Diagnosis not present

## 2020-02-07 DIAGNOSIS — N182 Chronic kidney disease, stage 2 (mild): Secondary | ICD-10-CM | POA: Diagnosis not present

## 2020-02-07 DIAGNOSIS — Z6836 Body mass index (BMI) 36.0-36.9, adult: Secondary | ICD-10-CM | POA: Diagnosis not present

## 2020-02-07 DIAGNOSIS — E1122 Type 2 diabetes mellitus with diabetic chronic kidney disease: Secondary | ICD-10-CM | POA: Diagnosis not present

## 2020-02-07 DIAGNOSIS — Z1231 Encounter for screening mammogram for malignant neoplasm of breast: Secondary | ICD-10-CM

## 2020-02-07 DIAGNOSIS — Z23 Encounter for immunization: Secondary | ICD-10-CM | POA: Diagnosis not present

## 2020-02-07 LAB — POCT URINALYSIS DIPSTICK
Bilirubin, UA: NEGATIVE
Glucose, UA: NEGATIVE
Ketones, UA: NEGATIVE
Nitrite, UA: NEGATIVE
Protein, UA: NEGATIVE
Spec Grav, UA: 1.015 (ref 1.010–1.025)
Urobilinogen, UA: 0.2 E.U./dL
pH, UA: 5.5 (ref 5.0–8.0)

## 2020-02-07 LAB — POCT UA - MICROALBUMIN
Albumin/Creatinine Ratio, Urine, POC: <30
Creatinine, POC: 200 mg/dL
Microalbumin Ur, POC: 30 mg/L

## 2020-02-07 MED ORDER — PREVNAR 13 IM SUSP: 0.5000 mL | INTRAMUSCULAR | 0 refills | Status: AC

## 2020-02-07 NOTE — Patient Instructions (Signed)
[name] , Thank you for taking time to come for your Medicare Wellness Visit. I appreciate your ongoing commitment to your health goals. Please review the following plan we discussed and let me know if I can assist you in the future.   Screening recommendations/referrals: Colonoscopy: decline Mammogram: patient to call and schedule Bone Density: completed 09/27/2017 Recommended yearly ophthalmology/optometry visit for glaucoma screening and checkup Recommended yearly dental visit for hygiene and checkup  Vaccinations: Influenza vaccine: completed 05/16/2019, due 03/03/2020 Pneumococcal vaccine: sent to pharmacy Tdap vaccine: completed 07/11/2013, due 07/12/2023 Shingles vaccine: discussed   Covid-19:will call with dates  Advanced directives: Advance directive discussed with you today. Even though you declined this today please call our office should you change your mind and we can give you the proper paperwork for you to fill out.   Conditions/risks identified: none  Next appointment: 08/13/2020 at 2:15 Follow up in one year for your annual wellness visit    Preventive Care 65 Years and Older, Female Preventive care refers to lifestyle choices and visits with your health care provider that can promote health and wellness. What does preventive care include?  A yearly physical exam. This is also called an annual well check.  Dental exams once or twice a year.  Routine eye exams. Ask your health care provider how often you should have your eyes checked.  Personal lifestyle choices, including:  Daily care of your teeth and gums.  Regular physical activity.  Eating a healthy diet.  Avoiding tobacco and drug use.  Limiting alcohol use.  Practicing safe sex.  Taking low-dose aspirin every day.  Taking vitamin and mineral supplements as recommended by your health care provider. What happens during an annual well check? The services and screenings done by your health care  provider during your annual well check will depend on your age, overall health, lifestyle risk factors, and family history of disease. Counseling  Your health care provider may ask you questions about your:  Alcohol use.  Tobacco use.  Drug use.  Emotional well-being.  Home and relationship well-being.  Sexual activity.  Eating habits.  History of falls.  Memory and ability to understand (cognition).  Work and work Astronomer.  Reproductive health. Screening  You may have the following tests or measurements:  Height, weight, and BMI.  Blood pressure.  Lipid and cholesterol levels. These may be checked every 5 years, or more frequently if you are over 70 years old.  Skin check.  Lung cancer screening. You may have this screening every year starting at age 37 if you have a 30-pack-year history of smoking and currently smoke or have quit within the past 15 years.  Fecal occult blood test (FOBT) of the stool. You may have this test every year starting at age 45.  Flexible sigmoidoscopy or colonoscopy. You may have a sigmoidoscopy every 5 years or a colonoscopy every 10 years starting at age 63.  Hepatitis C blood test.  Hepatitis B blood test.  Sexually transmitted disease (STD) testing.  Diabetes screening. This is done by checking your blood sugar (glucose) after you have not eaten for a while (fasting). You may have this done every 1-3 years.  Bone density scan. This is done to screen for osteoporosis. You may have this done starting at age 33.  Mammogram. This may be done every 1-2 years. Talk to your health care provider about how often you should have regular mammograms. Talk with your health care provider about your test results, treatment  options, and if necessary, the need for more tests. Vaccines  Your health care provider may recommend certain vaccines, such as:  Influenza vaccine. This is recommended every year.  Tetanus, diphtheria, and acellular  pertussis (Tdap, Td) vaccine. You may need a Td booster every 10 years.  Zoster vaccine. You may need this after age 78.  Pneumococcal 13-valent conjugate (PCV13) vaccine. One dose is recommended after age 42.  Pneumococcal polysaccharide (PPSV23) vaccine. One dose is recommended after age 62. Talk to your health care provider about which screenings and vaccines you need and how often you need them. This information is not intended to replace advice given to you by your health care provider. Make sure you discuss any questions you have with your health care provider. Document Released: 08/16/2015 Document Revised: 04/08/2016 Document Reviewed: 05/21/2015 Elsevier Interactive Patient Education  2017 [place] Prevention in the Home Falls can cause injuries. They can happen to people of all ages. There are many things you can do to make your home safe and to help prevent falls. What can I do on the outside of my home?  Regularly fix the edges of walkways and driveways and fix any cracks.  Remove anything that might make you trip as you walk through a door, such as a raised step or threshold.  Trim any bushes or trees on the path to your home.  Use bright outdoor lighting.  Clear any walking paths of anything that might make someone trip, such as rocks or tools.  Regularly check to see if handrails are loose or broken. Make sure that both sides of any steps have handrails.  Any raised decks and porches should have guardrails on the edges.  Have any leaves, snow, or ice cleared regularly.  Use sand or salt on walking paths during winter.  Clean up any spills in your garage right away. This includes oil or grease spills. What can I do in the bathroom?  Use night lights.  Install grab bars by the toilet and in the tub and shower. Do not use towel bars as grab bars.  Use non-skid mats or decals in the tub or shower.  If you need to sit down in the shower, use a plastic,  non-slip stool.  Keep the floor dry. Clean up any water that spills on the floor as soon as it happens.  Remove soap buildup in the tub or shower regularly.  Attach bath mats securely with double-sided non-slip rug tape.  Do not have throw rugs and other things on the floor that can make you trip. What can I do in the bedroom?  Use night lights.  Make sure that you have a light by your bed that is easy to reach.  Do not use any sheets or blankets that are too big for your bed. They should not hang down onto the floor.  Have a firm chair that has side arms. You can use this for support while you get dressed.  Do not have throw rugs and other things on the floor that can make you trip. What can I do in the kitchen?  Clean up any spills right away.  Avoid walking on wet floors.  Keep items that you use a lot in easy-to-reach places.  If you need to reach something above you, use a strong step stool that has a grab bar.  Keep electrical cords out of the way.  Do not use floor polish or wax that makes floors slippery.  If you must use wax, use non-skid floor wax.  Do not have throw rugs and other things on the floor that can make you trip. What can I do with my stairs?  Do not leave any items on the stairs.  Make sure that there are handrails on both sides of the stairs and use them. Fix handrails that are broken or loose. Make sure that handrails are as long as the stairways.  Check any carpeting to make sure that it is firmly attached to the stairs. Fix any carpet that is loose or worn.  Avoid having throw rugs at the top or bottom of the stairs. If you do have throw rugs, attach them to the floor with carpet tape.  Make sure that you have a light switch at the top of the stairs and the bottom of the stairs. If you do not have them, ask someone to add them for you. What else can I do to help prevent falls?  Wear shoes that:  Do not have high heels.  Have rubber  bottoms.  Are comfortable and fit you well.  Are closed at the toe. Do not wear sandals.  If you use a stepladder:  Make sure that it is fully opened. Do not climb a closed stepladder.  Make sure that both sides of the stepladder are locked into place.  Ask someone to hold it for you, if possible.  Clearly mark and make sure that you can see:  Any grab bars or handrails.  First and last steps.  Where the edge of each step is.  Use tools that help you move around (mobility aids) if they are needed. These include:  Canes.  Walkers.  Scooters.  Crutches.  Turn on the lights when you go into a dark area. Replace any light bulbs as soon as they burn out.  Set up your furniture so you have a clear path. Avoid moving your furniture around.  If any of your floors are uneven, fix them.  If there are any pets around you, be aware of where they are.  Review your medicines with your doctor. Some medicines can make you feel dizzy. This can increase your chance of falling. Ask your doctor what other things that you can do to help prevent falls. This information is not intended to replace advice given to you by your health care provider. Make sure you discuss any questions you have with your health care provider. Document Released: 05/16/2009 Document Revised: 12/26/2015 Document Reviewed: 08/24/2014 Elsevier Interactive Patient Education  2017 [employer].

## 2020-02-07 NOTE — Addendum Note (Signed)
Addended by: Barb Merino on: 02/07/2020 04:19 PM   Modules accepted: Orders

## 2020-02-07 NOTE — Progress Notes (Signed)
This visit occurred during the SARS-CoV-2 public health emergency.  Safety protocols were in place, including screening questions prior to the visit, additional usage of staff PPE, and extensive cleaning of exam room while observing appropriate contact time as indicated for disinfecting solutions.  Subjective:   Renee Bradley is a 74 y.o. female who presents for Medicare Annual (Subsequent) preventive examination.  Review of Systems     Cardiac Risk Factors include: advanced age (>11men, >37 women);diabetes mellitus;hypertension;dyslipidemia;sedentary lifestyle;obesity (BMI >30kg/m2)     Objective:    Today's Vitals   02/07/20 1417  BP: 134/82  Pulse: 62  Temp: 98.8 F (37.1 C)  TempSrc: Oral  SpO2: 94%  Weight: 199 lb 12.8 oz (90.6 kg)  Height: 5\' 2"  (1.575 m)   Body mass index is 36.54 kg/m.  Advanced Directives 02/07/2020 02/02/2019 03/25/2015 03/03/2015 10/30/2014 09/29/2014 08/15/2014  Does Patient Have a Medical Advance Directive? No No Yes No No No No  Type of Advance Directive - - Healthcare Power of Attorney - - - -  Copy of Healthcare Power of Attorney in Chart? - - Yes - - - -  Would patient like information on creating a medical advance directive? No - Patient declined Yes (MAU/Ambulatory/Procedural Areas - Information given) - - No - patient declined information No - patient declined information No - patient declined information    Current Medications (verified) Outpatient Encounter Medications as of 02/07/2020  Medication Sig  . amLODipine (NORVASC) 5 MG tablet   . aspirin EC 81 MG tablet Take 81 mg by mouth daily.  04/09/2020 atenolol (TENORMIN) 50 MG tablet TAKE 1 TABLET TWICE A DAY (Patient taking differently: Taking the brand name)  . atorvastatin (LIPITOR) 20 MG tablet TAKE 1 TABLET DAILY  . ergocalciferol (VITAMIN D2) 50000 units capsule Take 50,000 Units by mouth once a week.  . furosemide (LASIX) 20 MG tablet TAKE 1 TABLET DAILY        *HIKMA/[facility] MFR*  . Lancets  (ONETOUCH DELICA PLUS LANCET33G) MISC   . ONETOUCH VERIO test strip   . atenolol (TENORMIN) 50 MG tablet 1 tablet by mouth 2 times a day DAW (Patient not taking: Reported on 02/07/2020)  . famotidine (PEPCID) 20 MG tablet TAKE 1 TABLET BY MOUTH TWICE A DAY (Patient not taking: No sig reported)  . pneumococcal 13-valent conjugate vaccine (PREVNAR 13) SUSP injection Inject 0.5 mLs into the muscle tomorrow at 10 am for 1 dose.   No facility-administered encounter medications on file as of 02/07/2020.    Allergies (verified) Janumet [sitagliptin-metformin hcl], Januvia [sitagliptin], Kombiglyze [saxagliptin-metformin er], and Sulfur   History: Past Medical History:  Diagnosis Date  . Asthma   . Chest pressure 07/13/2012  . Diabetes mellitus without complication (HCC)   . DM (diabetes mellitus) (HCC) 07/13/2012  . HTN (hypertension) 07/13/2012  . Hypercholesterolemia   . Hypertension   . SOB (shortness of breath) 07/13/2012  . Tachycardia, unspecified, "fluttering" 07/13/2012   Past Surgical History:  Procedure Laterality Date  . ABDOMINAL HYSTERECTOMY     Family History  Problem Relation Age of Onset  . Heart attack Mother   . Diabetes type II Mother   . Stroke Mother   . Alzheimer's disease Father   . Diabetes type II Brother    Social History   Socioeconomic History  . Marital status: Married    Spouse name: Not on file  . Number of children: Not on file  . Years of education: Not on file  . Highest education  level: Not on file  Occupational History  . Occupation: retired  Tobacco Use  . Smoking status: Former Smoker    Years: 2.00    Quit date: 05/13/1983    Years since quitting: 36.7  . Smokeless tobacco: Never Used  Vaping Use  . Vaping Use: Never used  Substance and Sexual Activity  . Alcohol use: No  . Drug use: No  . Sexual activity: Yes  Other Topics Concern  . Not on file  Social History Narrative  . Not on file   Social Determinants of Health    Financial Resource Strain: Low Risk   . Difficulty of Paying Living Expenses: Not hard at all  Food Insecurity: No Food Insecurity  . Worried About Programme researcher, broadcasting/film/video in the Last Year: Never true  . Ran Out of Food in the Last Year: Never true  Transportation Needs: No Transportation Needs  . Lack of Transportation (Medical): No  . Lack of Transportation (Non-Medical): No  Physical Activity: Inactive  . Days of Exercise per Week: 0 days  . Minutes of Exercise per Session: 0 min  Stress: No Stress Concern Present  . Feeling of Stress : Not at all  Social Connections:   . Frequency of Communication with Friends and Family:   . Frequency of Social Gatherings with Friends and Family:   . Attends Religious Services:   . Active Member of Clubs or Organizations:   . Attends Banker Meetings:   Marland Kitchen Marital Status:     Tobacco Counseling Counseling given: Not Answered   Clinical Intake:  Pre-visit preparation completed: Yes  Pain : No/denies pain     Nutritional Status: BMI > 30  Obese Nutritional Risks: None Diabetes: Yes  How often do you need to have someone help you when you read instructions, pamphlets, or other written materials from your doctor or pharmacy?: 1 - Never What is the last grade level you completed in school?: 14 years  Diabetic? Yes Nutrition Risk Assessment:  Has the patient had any N/V/D within the last 2 months?  No  Does the patient have any non-healing wounds?  No  Has the patient had any unintentional weight loss or weight gain?  No   Diabetes:  Is the patient diabetic?  Yes  If diabetic, was a CBG obtained today?  No  Did the patient bring in their glucometer from home?  No  How often do you monitor your CBG's? Three times daily.   Financial Strains and Diabetes Management:  Are you having any financial strains with the device, your supplies or your medication? No .  Does the patient want to be seen by Chronic Care Management  for management of their diabetes?  No  Would the patient like to be referred to a Nutritionist or for Diabetic Management?  No   Diabetic Exams:  Diabetic Eye Exam: Overdue for diabetic eye exam. Pt has been advised about the importance in completing this exam. Patient advised to call and schedule an eye exam. Diabetic Foot Exam: Completed 10/31/2019   Interpreter Needed?: No  Information entered by :: NAllen LPN   Activities of Daily Living In your present state of health, do you have any difficulty performing the following activities: 02/07/2020  Hearing? N  Vision? N  Difficulty concentrating or making decisions? N  Walking or climbing stairs? Y  Comment due to left hip  Dressing or bathing? N  Doing errands, shopping? N  Preparing Food and eating ? N  Using the Toilet? N  In the past six months, have you accidently leaked urine? N  Do you have problems with loss of bowel control? N  Managing your Medications? N  Managing your Finances? N  Housekeeping or managing your Housekeeping? N  Some recent data might be hidden    Patient Care Team: Dorothyann Peng, MD as PCP - General (Internal Medicine)  Indicate any recent Medical Services you may have received from other than [facility] providers in the past year (date may be approximate).     Assessment:   This is a routine wellness examination for Hasset.  Hearing/Vision screen  Hearing Screening   125Hz  250Hz  500Hz  1000Hz  2000Hz  3000Hz  4000Hz  6000Hz  8000Hz   Right ear:           Left ear:           Vision Screening Comments: Regular eye exams, Dr. Lahoma Rocker  Dietary issues and exercise activities discussed: Current Exercise Habits: The patient does not participate in regular exercise at present  Goals    . Patient Stated     Keep doing the best she can    . Patient Stated     02/07/2020, work on exercising and cut down on potato chip intake      Depression Screen PHQ 2/9 Scores 02/07/2020 06/19/2019 02/02/2019 08/17/2018  07/13/2018 06/14/2018  PHQ - 2 Score 0 0 0 0 0 0  PHQ- 9 Score - - 0 - - -    Fall Risk Fall Risk  02/07/2020 06/19/2019 02/02/2019 08/17/2018 07/13/2018  Falls in the past year? 0 0 0 0 0  Comment - - - - -  Risk for fall due to : Medication side effect - Medication side effect - -  Follow up Falls evaluation completed;Education provided;Falls prevention discussed - Falls evaluation completed;Education provided;Falls prevention discussed - -    Any stairs in or around the home? Yes  If so, are there any without handrails? Yes  Home free of loose throw rugs in walkways, pet beds, electrical cords, etc? Yes  Adequate lighting in your home to reduce risk of falls? Yes   ASSISTIVE DEVICES UTILIZED TO PREVENT FALLS:  Life alert? No  Use of a cane, walker or w/c? No  Grab bars in the bathroom? No  Shower chair or bench in shower? No  Elevated toilet seat or a handicapped toilet? Yes   TIMED UP AND GO:  Was the test performed? No .     Cognitive Function:     6CIT Screen 02/07/2020 02/02/2019  What Year? 0 points 0 points  What month? 0 points 0 points  What time? 0 points 0 points  Count back from 20 0 points 0 points  Months in reverse 0 points 0 points  Repeat phrase 4 points 0 points  Total Score 4 0    Immunizations Immunization History  Administered Date(s) Administered  . DTaP 07/11/2013  . Influenza, High Dose Seasonal PF 06/14/2018, 05/16/2019    TDAP status: Up to date Flu Vaccine status: Up to date {Pneumococcal vaccine status: sent to pharmacy Covid-19 vaccine status: Completed vaccines  Qualifies for Shingles Vaccine? Yes   Zostavax completed No   Shingrix Completed?: No.    Education has been provided regarding the importance of this vaccine. Patient has been advised to call insurance company to determine out of pocket expense if they have not yet received this vaccine. Advised may also receive vaccine at local pharmacy or Health Dept. Verbalized acceptance and  understanding.  Screening Tests Health Maintenance  Topic Date Due  . Hepatitis C Screening  Never done  . OPHTHALMOLOGY EXAM  Never done  . COVID-19 Vaccine (1) Never done  . COLONOSCOPY  Never done  . PNA vac Low Risk Adult (1 of 2 - PCV13) Never done  . HEMOGLOBIN A1C  08/05/2019  . MAMMOGRAM  09/28/2019  . FOOT EXAM  02/02/2020  . URINE MICROALBUMIN  02/02/2020  . INFLUENZA VACCINE  03/03/2020  . TETANUS/TDAP  07/12/2023  . DEXA SCAN  Completed    Health Maintenance  Health Maintenance Due  Topic Date Due  . Hepatitis C Screening  Never done  . OPHTHALMOLOGY EXAM  Never done  . COVID-19 Vaccine (1) Never done  . COLONOSCOPY  Never done  . PNA vac Low Risk Adult (1 of 2 - PCV13) Never done  . HEMOGLOBIN A1C  08/05/2019  . MAMMOGRAM  09/28/2019  . FOOT EXAM  02/02/2020  . URINE MICROALBUMIN  02/02/2020    Colorectal cancer screening: Declined Mammogram status: Patient to schedule Bone Density status: Completed 09/27/2017.  Lung Cancer Screening: (Low Dose CT Chest recommended if Age 78-80 years, 30 pack-year currently smoking OR have quit w/in 15years.) does not qualify.   Lung Cancer Screening Referral: no  Additional Screening:  Hepatitis C Screening: does qualify; Completed today  Vision Screening: Recommended annual ophthalmology exams for early detection of glaucoma and other disorders of the eye. Is the patient up to date with their annual eye exam?  No  Who is the provider or what is the name of the office in which the patient attends annual eye exams? Dr. Lahoma Rocker If pt is not established with a provider, would they like to be referred to a provider to establish care? No .   Dental Screening: Recommended annual dental exams for proper oral hygiene  Community Resource Referral / Chronic Care Management: CRR required this visit?  No   CCM required this visit?  No      Plan:     I have personally reviewed and noted the following in the patient's  chart:   . Medical and social history . Use of alcohol, tobacco or illicit drugs  . Current medications and supplements . Functional ability and status . Nutritional status . Physical activity . Advanced directives . List of other physicians . Hospitalizations, surgeries, and ER visits in previous 12 months . Vitals . Screenings to include cognitive, depression, and falls . Referrals and appointments  In addition, I have reviewed and discussed with patient certain preventive protocols, quality metrics, and best practice recommendations. A written personalized care plan for preventive services as well as general preventive health recommendations were provided to patient.     Barb Merino, LPN   09/09/8339   Nurse Notes: Patient to call to schedule mammogram. Wants to think about colonoscopy. Will call to give dates of covid vaccine

## 2020-02-08 LAB — CMP14+EGFR
ALT: 12 IU/L (ref 0–32)
AST: 16 IU/L (ref 0–40)
Albumin/Globulin Ratio: 1.8 (ref 1.2–2.2)
Albumin: 4.3 g/dL (ref 3.7–4.7)
Alkaline Phosphatase: 73 IU/L (ref 48–121)
BUN/Creatinine Ratio: 26 (ref 12–28)
BUN: 17 mg/dL (ref 8–27)
Bilirubin Total: 0.7 mg/dL (ref 0.0–1.2)
CO2: 22 mmol/L (ref 20–29)
Calcium: 9.6 mg/dL (ref 8.7–10.3)
Chloride: 110 mmol/L — ABNORMAL HIGH (ref 96–106)
Creatinine, Ser: 0.66 mg/dL (ref 0.57–1.00)
GFR calc Af Amer: 102 mL/min/{1.73_m2} (ref >59)
GFR calc non Af Amer: 89 mL/min/{1.73_m2} (ref >59)
Globulin, Total: 2.4 g/dL (ref 1.5–4.5)
Glucose: 106 mg/dL — ABNORMAL HIGH (ref 65–99)
Potassium: 4.3 mmol/L (ref 3.5–5.2)
Sodium: 146 mmol/L — ABNORMAL HIGH (ref 134–144)
Total Protein: 6.7 g/dL (ref 6.0–8.5)

## 2020-02-08 LAB — LIPID PANEL
Chol/HDL Ratio: 2.6 ratio (ref 0.0–4.4)
Cholesterol, Total: 172 mg/dL (ref 100–199)
HDL: 65 mg/dL (ref >39)
LDL Chol Calc (NIH): 94 mg/dL (ref 0–99)
Triglycerides: 66 mg/dL (ref 0–149)
VLDL Cholesterol Cal: 13 mg/dL (ref 5–40)

## 2020-02-08 LAB — CBC
Hematocrit: 40 % (ref 34.0–46.6)
Hemoglobin: 13.6 g/dL (ref 11.1–15.9)
MCH: 29.6 pg (ref 26.6–33.0)
MCHC: 34 g/dL (ref 31.5–35.7)
MCV: 87 fL (ref 79–97)
Platelets: 200 10*3/uL (ref 150–450)
RBC: 4.59 x10E6/uL (ref 3.77–5.28)
RDW: 14 % (ref 11.7–15.4)
WBC: 4.1 10*3/uL (ref 3.4–10.8)

## 2020-02-08 LAB — HEPATITIS C ANTIBODY: Hep C Virus Ab: <0.1 s/co ratio (ref 0.0–0.9)

## 2020-02-08 LAB — HEMOGLOBIN A1C
Est. average glucose Bld gHb Est-mCnc: 134 mg/dL
Hgb A1c MFr Bld: 6.3 % — ABNORMAL HIGH (ref 4.8–5.6)

## 2020-02-13 ENCOUNTER — Ambulatory Visit: Payer: Medicare Other | Admitting: Internal Medicine

## 2020-02-13 ENCOUNTER — Ambulatory Visit: Payer: Medicare Other

## 2020-02-14 NOTE — Progress Notes (Signed)
This visit occurred during the SARS-CoV-2 public health emergency.  Safety protocols were in place, including screening questions prior to the visit, additional usage of staff PPE, and extensive cleaning of exam room while observing appropriate contact time as indicated for disinfecting solutions.  Subjective:     Patient ID: Renee Bradley , female    DOB: [birth year] , 74 y.o.   MRN: [id number]   Chief Complaint  Patient presents with   Diabetes   Hypertension    HPI  She presents today for diabetes check. She is also followed by Endocrinology, Dr. Chalmers Bradley - however, she states she only sees her once yearly.   Diabetes She presents for her follow-up diabetic visit. She has type 2 diabetes mellitus. Her disease course has been stable. There are no hypoglycemic associated symptoms. There are no diabetic associated symptoms. Pertinent negatives for diabetes include no blurred vision and no chest pain. There are no hypoglycemic complications. Diabetic complications include nephropathy. Risk factors for coronary artery disease include diabetes mellitus, dyslipidemia, hypertension, post-menopausal, sedentary lifestyle and obesity. She is compliant with treatment some of the time. Her weight is fluctuating minimally. She is following a diabetic diet. She participates in exercise intermittently. An ACE inhibitor/angiotensin II receptor blocker is not being taken.  Hypertension This is a chronic problem. The current episode started more than 1 year ago. The problem has been gradually improving since onset. The problem is uncontrolled. Pertinent negatives include no blurred vision or chest pain. The current treatment provides moderate improvement. Hypertensive end-organ damage includes kidney disease.     Past Medical History:  Diagnosis Date   Asthma    Chest pressure 07/13/2012   Diabetes mellitus without complication (HCC)    DM (diabetes mellitus) ([place]) 07/13/2012   HTN (hypertension)  07/13/2012   Hypercholesterolemia    Hypertension    SOB (shortness of breath) 07/13/2012   Tachycardia, unspecified, "fluttering" 07/13/2012     Family History  Problem Relation Age of Onset   Heart attack Mother    Diabetes type II Mother    Stroke Mother    Alzheimer's disease Father    Diabetes type II Brother      Current Outpatient Medications:    amLODipine (NORVASC) 5 MG tablet, , Disp: , Rfl:    aspirin EC 81 MG tablet, Take 81 mg by mouth daily., Disp: , Rfl:    atenolol (TENORMIN) 50 MG tablet, 1 tablet by mouth 2 times a day DAW (Patient not taking: Reported on 02/07/2020), Disp: 90 tablet, Rfl: 1   atenolol (TENORMIN) 50 MG tablet, TAKE 1 TABLET TWICE A DAY (Patient taking differently: Taking the brand name), Disp: 180 tablet, Rfl: 1   atorvastatin (LIPITOR) 20 MG tablet, TAKE 1 TABLET DAILY, Disp: 90 tablet, Rfl: 2   ergocalciferol (VITAMIN D2) 50000 units capsule, Take 50,000 Units by mouth once a week., Disp: , Rfl:    famotidine (PEPCID) 20 MG tablet, TAKE 1 TABLET BY MOUTH TWICE A DAY (Patient not taking: No sig reported), Disp: 60 tablet, Rfl: 4   furosemide (LASIX) 20 MG tablet, TAKE 1 TABLET DAILY        *HIKMA/WESTWARD MFR*, Disp: 90 tablet, Rfl: 0   Lancets (ONETOUCH DELICA PLUS [id number]) MISC, , Disp: , Rfl:    ONETOUCH VERIO test strip, , Disp: , Rfl:    Allergies  Allergen Reactions   Janumet [Sitagliptin-Metformin Hcl] Shortness Of Breath   Januvia [Sitagliptin] Shortness Of Breath   Kombiglyze [Saxagliptin-Metformin Er] Shortness Of Breath  Sulfur Itching     Review of Systems  Constitutional: Negative.   Eyes: Negative for blurred vision.  Respiratory: Negative.   Cardiovascular: Negative.  Negative for chest pain.  Gastrointestinal: Negative.   Neurological: Negative.   Psychiatric/Behavioral: Negative.      Today's Vitals   02/07/20 1444  BP: 134/82  Pulse: 62  Temp: 98.8 F (37.1 C)  TempSrc: Oral  Weight:  199 lb 12.8 oz (90.6 kg)  Height: 5' 2" (1.575 m)  PainSc: 0-No pain   Body mass index is 36.54 kg/m.   Objective:  Physical Exam Vitals and nursing note reviewed.  Constitutional:      Appearance: Normal appearance.  HENT:     Head: Normocephalic and atraumatic.  Cardiovascular:     Rate and Rhythm: Normal rate and regular rhythm.     Heart sounds: Normal heart sounds.  Pulmonary:     Effort: Pulmonary effort is normal.     Breath sounds: Normal breath sounds.  Skin:    General: Skin is warm.  Neurological:     General: No focal deficit present.     Mental Status: She is alert.  Psychiatric:        Mood and Affect: Mood normal.        Behavior: Behavior normal.         Assessment And Plan:     1. Type 2 diabetes mellitus with stage 2 chronic kidney disease, without long-term current use of insulin (HCC)  Chronic, I will check labs as listed below. She is agreeable to referral for diabetic eye exam. Importance of dietary compliance was discussed with the patient.   - Hemoglobin A1c - Ambulatory referral to Ophthalmology - CMP14+EGFR - CBC - Lipid panel  2. Hypertensive nephropathy  Chronic, controlled. She will continue with current meds. She is encouraged to avoid adding salt to her foods.   3. Class 2 severe obesity due to excess calories with serious comorbidity and body mass index (BMI) of 36.0 to 36.9 in adult Renee Bradley)  She is encouraged to strive for BMI less than 30 to decrease cardiac risk. Encouraged to incorporate more exercise into her daily routine.   4. Encounter for screening mammogram for malignant neoplasm of breast  - MM Digital Screening; Future  Patient was given opportunity to ask questions. Patient verbalized understanding of the plan and was able to repeat key elements of the plan. All questions were answered to their satisfaction.   Renee Greenland, MD   I, Renee Greenland, MD, have reviewed all documentation for this visit. The  documentation on 02/14/20 for the exam, diagnosis, procedures, and orders are all accurate and complete.  THE PATIENT IS ENCOURAGED TO PRACTICE SOCIAL DISTANCING DUE TO THE COVID-19 PANDEMIC.

## 2020-02-29 DIAGNOSIS — H0100A Unspecified blepharitis right eye, upper and lower eyelids: Secondary | ICD-10-CM | POA: Diagnosis not present

## 2020-03-12 DIAGNOSIS — E119 Type 2 diabetes mellitus without complications: Secondary | ICD-10-CM | POA: Diagnosis not present

## 2020-03-15 ENCOUNTER — Other Ambulatory Visit: Payer: Self-pay | Admitting: Internal Medicine

## 2020-05-01 DIAGNOSIS — Z20828 Contact with and (suspected) exposure to other viral communicable diseases: Secondary | ICD-10-CM | POA: Diagnosis not present

## 2020-05-02 ENCOUNTER — Ambulatory Visit: Payer: Medicare Other

## 2020-05-31 ENCOUNTER — Ambulatory Visit (INDEPENDENT_AMBULATORY_CARE_PROVIDER_SITE_OTHER): Payer: Medicare Other

## 2020-05-31 ENCOUNTER — Other Ambulatory Visit: Payer: Self-pay

## 2020-05-31 VITALS — BP 175/80 | HR 95 | Temp 98.4°F | Ht 62.0 in

## 2020-05-31 DIAGNOSIS — Z23 Encounter for immunization: Secondary | ICD-10-CM | POA: Diagnosis not present

## 2020-06-29 ENCOUNTER — Other Ambulatory Visit: Payer: Self-pay | Admitting: Internal Medicine

## 2020-07-01 ENCOUNTER — Ambulatory Visit: Payer: Medicare Other

## 2020-07-04 ENCOUNTER — Ambulatory Visit (INDEPENDENT_AMBULATORY_CARE_PROVIDER_SITE_OTHER): Payer: Medicare Other | Admitting: Internal Medicine

## 2020-07-04 ENCOUNTER — Encounter: Payer: Self-pay | Admitting: Internal Medicine

## 2020-07-04 ENCOUNTER — Other Ambulatory Visit: Payer: Self-pay

## 2020-07-04 VITALS — BP 144/86 | HR 60 | Temp 98.3°F | Ht 62.0 in | Wt 201.0 lb

## 2020-07-04 DIAGNOSIS — N182 Chronic kidney disease, stage 2 (mild): Secondary | ICD-10-CM | POA: Diagnosis not present

## 2020-07-04 DIAGNOSIS — M79672 Pain in left foot: Secondary | ICD-10-CM

## 2020-07-04 DIAGNOSIS — F419 Anxiety disorder, unspecified: Secondary | ICD-10-CM

## 2020-07-04 DIAGNOSIS — I129 Hypertensive chronic kidney disease with stage 1 through stage 4 chronic kidney disease, or unspecified chronic kidney disease: Secondary | ICD-10-CM

## 2020-07-04 NOTE — Patient Instructions (Signed)
 Gout  Gout is painful swelling of your joints. Gout is a type of arthritis. It is caused by having too much uric acid in your body. Uric acid is a chemical that is made when your body breaks down substances called purines. If your body has too much uric acid, sharp crystals can form and build up in your joints. This causes pain and swelling. Gout attacks can happen quickly and be very painful (acute gout). Over time, the attacks can affect more joints and happen more often (chronic gout). What are the causes?  Too much uric acid in your blood. This can happen because: ? Your kidneys do not remove enough uric acid from your blood. ? Your body makes too much uric acid. ? You eat too many foods that are high in purines. These foods include organ meats, some seafood, and beer.  Trauma or stress. What increases the risk?  Having a family history of gout.  Being female and middle-aged.  Being female and having gone through menopause.  Being very overweight (obese).  Drinking alcohol, especially beer.  Not having enough water in the body (being dehydrated).  Losing weight too quickly.  Having an organ transplant.  Having lead poisoning.  Taking certain medicines.  Having kidney disease.  Having a skin condition called psoriasis. What are the signs or symptoms? An attack of acute gout usually happens in just one joint. The most common place is the big toe. Attacks often start at night. Other joints that may be affected include joints of the feet, ankle, knee, fingers, wrist, or elbow. Symptoms of an attack may include:  Very bad pain.  Warmth.  Swelling.  Stiffness.  Shiny, red, or purple skin.  Tenderness. The affected joint may be very painful to touch.  Chills and fever. Chronic gout may cause symptoms more often. More joints may be involved. You may also have white or yellow lumps (tophi) on your hands or feet or in other areas near your joints. How is this  treated?  Treatment for this condition has two phases: treating an acute attack and preventing future attacks.  Acute gout treatment may include: ? NSAIDs. ? Steroids. These are taken by mouth or injected into a joint. ? Colchicine. This medicine relieves pain and swelling. It can be given by mouth or through an IV tube.  Preventive treatment may include: ? Taking small doses of NSAIDs or colchicine daily. ? Using a medicine that reduces uric acid levels in your blood. ? Making changes to your diet. You may need to see a food expert (dietitian) about what to eat and drink to prevent gout. Follow these instructions at home: During a gout attack   If told, put ice on the painful area: ? Put ice in a plastic bag. ? Place a towel between your skin and the bag. ? Leave the ice on for 20 minutes, 2-3 times a day.  Raise (elevate) the painful joint above the level of your heart as often as you can.  Rest the joint as much as possible. If the joint is in your leg, you may be given crutches.  Follow instructions from your doctor about what you cannot eat or drink. Avoiding future gout attacks  Eat a low-purine diet. Avoid foods and drinks such as: ? Liver. ? Kidney. ? Anchovies. ? Asparagus. ? Herring. ? Mushrooms. ? Mussels. ? Beer.  Stay at a healthy weight. If you want to lose weight, talk with your doctor. Do not lose   weight too fast.  Start or continue an exercise plan as told by your doctor. Eating and drinking  Drink enough fluids to keep your pee (urine) pale yellow.  If you drink alcohol: ? Limit how much you use to:  0-1 drink a day for women.  0-2 drinks a day for men. ? Be aware of how much alcohol is in your drink. In the [place] one drink equals one 12 oz bottle of beer (355 mL), one 5 oz glass of wine (148 mL), or one 1 oz glass of hard liquor (44 mL). General instructions  Take over-the-counter and prescription medicines only as told by your doctor.  Do  not drive or use heavy machinery while taking prescription pain medicine.  Return to your normal activities as told by your doctor. Ask your doctor what activities are safe for you.  Keep all follow-up visits as told by your doctor. This is important. Contact a doctor if:  You have another gout attack.  You still have symptoms of a gout attack after 10 days of treatment.  You have problems (side effects) because of your medicines.  You have chills or a fever.  You have burning pain when you pee (urinate).  You have pain in your lower back or belly. Get help right away if:  You have very bad pain.  Your pain cannot be controlled.  You cannot pee. Summary  Gout is painful swelling of the joints.  The most common site of pain is the big toe, but it can affect other joints.  Medicines and avoiding some foods can help to prevent and treat gout attacks. This information is not intended to replace advice given to you by your health care provider. Make sure you discuss any questions you have with your health care provider. Document Revised: 02/09/2018 Document Reviewed: 02/09/2018 Elsevier Patient Education  2020 Elsevier Inc.  

## 2020-07-04 NOTE — Progress Notes (Signed)
I,Yamilka [employer] as a Education administrator for [name], MD.,have documented all relevant documentation on the behalf of [name], MD,as directed by  [name], MD while in the presence of [name], MD. This visit occurred during the SARS-CoV-2 public health emergency.  Safety protocols were in place, including screening questions prior to the visit, additional usage of staff PPE, and extensive cleaning of exam room while observing appropriate contact time as indicated for disinfecting solutions.  Subjective:     Patient ID: Renee Bradley , female    DOB: 1945/08/28 , 74 y.o.   MRN: [id number]   Chief Complaint  Patient presents with  . Gout    HPI  She is here today for further evaluation of left foot pain. Denies fall or trauma. States her sx started within past 3 days. Not sure what could have triggered her sx. States she was told 'years ago' that she has gout. She can't recall her last gouty attack.     Past Medical History:  Diagnosis Date  . Asthma   . Chest pressure 07/13/2012  . Diabetes mellitus without complication ([place])   . DM (diabetes mellitus) ([place]) 07/13/2012  . HTN (hypertension) 07/13/2012  . Hypercholesterolemia   . Hypertension   . SOB (shortness of breath) 07/13/2012  . Tachycardia, unspecified, "fluttering" 07/13/2012     Family History  Problem Relation Age of Onset  . Heart attack Mother   . Diabetes type II Mother   . Stroke Mother   . Alzheimer's disease Father   . Diabetes type II Brother      Current Outpatient Medications:  .  amLODipine (NORVASC) 5 MG tablet, , Disp: , Rfl:  .  aspirin EC 81 MG tablet, Take 81 mg by mouth daily., Disp: , Rfl:  .  atorvastatin (LIPITOR) 20 MG tablet, TAKE 1 TABLET DAILY, Disp: 90 tablet, Rfl: 2 .  ergocalciferol (VITAMIN D2) 50000 units capsule, Take 50,000 Units by mouth once a week., Disp: , Rfl:  .  furosemide (LASIX) 20 MG tablet, TAKE 1 TABLET DAILY        (HIKMA/WESTWARD),  Disp: 90 tablet, Rfl: 0 .  Lancets (ONETOUCH DELICA PLUS [id number]) MISC, , Disp: , Rfl:  .  ONETOUCH VERIO test strip, , Disp: , Rfl:  .  allopurinol (ZYLOPRIM) 100 MG tablet, Take 1 tablet (100 mg total) by mouth daily., Disp: 30 tablet, Rfl: 1 .  atenolol (TENORMIN) 50 MG tablet, TAKE 1 TABLET TWICE A DAY, Disp: 180 tablet, Rfl: 1   Allergies  Allergen Reactions  . Janumet [Sitagliptin-Metformin Hcl] Shortness Of Breath  . Januvia [Sitagliptin] Shortness Of Breath  . Kombiglyze [Saxagliptin-Metformin Er] Shortness Of Breath  . Sulfur Itching     Review of Systems  Constitutional: Negative.   HENT: Negative.   Eyes: Negative.   Respiratory: Negative.   Cardiovascular: Negative.   Gastrointestinal: Negative.   Endocrine: Negative.   Genitourinary: Negative.   Musculoskeletal: Positive for joint swelling.  Skin: Negative.   Neurological: Negative.   Hematological: Negative.   Psychiatric/Behavioral: The patient is nervous/anxious.        She states she has been having anxiety attacks. She thinks it is due to the change in manufacturer of the atenolol she takes. She wants to change manufacturers. She has yet to speak to pharmacy.      Today's Vitals   07/04/20 1104  BP: (!) 144/86  Pulse: 60  Temp: 98.3 F (36.8 C)  TempSrc: Oral  Weight: 201 lb (91.2 kg)  Height: 5' 2"  (1.575 m)  PainSc: 2   PainLoc: Foot   Body mass index is 36.76 kg/m.   Objective:  Physical Exam Vitals and nursing note reviewed.  Constitutional:      Appearance: Normal appearance.  HENT:     Head: Normocephalic and atraumatic.  Cardiovascular:     Rate and Rhythm: Normal rate and regular rhythm.     Heart sounds: Normal heart sounds.  Pulmonary:     Effort: Pulmonary effort is normal.     Breath sounds: Normal breath sounds.  Musculoskeletal:     Cervical back: Normal range of motion.  Feet:     Left foot:     Skin integrity: Dry skin present. No blister or erythema.     Comments:  There is some left foot tenderness to palpation of forefoot. No overlying erythema.  Skin:    General: Skin is warm.  Neurological:     General: No focal deficit present.     Mental Status: She is alert.  Psychiatric:        Mood and Affect: Mood normal.        Behavior: Behavior normal.         Assessment And Plan:     1. Left foot pain - Uric acid  2. Hypertensive nephropathy - BMP8+EGFR  3. Chronic renal disease, stage II  4. Anxiety    Patient was given opportunity to ask questions. Patient verbalized understanding of the plan and was able to repeat key elements of the plan. All questions were answered to their satisfaction.  [name], MD   I, [name], MD, have reviewed all documentation for this visit. The documentation on 07/21/20 for the exam, diagnosis, procedures, and orders are all accurate and complete.  THE PATIENT IS ENCOURAGED TO PRACTICE SOCIAL DISTANCING DUE TO THE COVID-19 PANDEMIC.

## 2020-07-05 LAB — BMP8+EGFR
BUN/Creatinine Ratio: 19 (ref 12–28)
BUN: 18 mg/dL (ref 8–27)
CO2: 20 mmol/L (ref 20–29)
Calcium: 9.6 mg/dL (ref 8.7–10.3)
Chloride: 109 mmol/L — ABNORMAL HIGH (ref 96–106)
Creatinine, Ser: 0.94 mg/dL (ref 0.57–1.00)
GFR calc Af Amer: 70 mL/min/{1.73_m2} (ref >59)
GFR calc non Af Amer: 60 mL/min/{1.73_m2} (ref >59)
Glucose: 119 mg/dL — ABNORMAL HIGH (ref 65–99)
Potassium: 4.5 mmol/L (ref 3.5–5.2)
Sodium: 145 mmol/L — ABNORMAL HIGH (ref 134–144)

## 2020-07-05 LAB — URIC ACID: Uric Acid: 7.7 mg/dL (ref 3.1–7.9)

## 2020-07-09 ENCOUNTER — Telehealth: Payer: Self-pay

## 2020-07-09 NOTE — Telephone Encounter (Signed)
Your uric acid level is higher than what it should be. Are you willing to take medication for gout? Your kidney function has decreased be sure to stay well hydrated. Your sodium level is elevated, be sure to stay well hydrated. How much water do you drink?   Patient stated she is drinking water but she has had trouble finding the brand that she likes. Also she is ok with taking a medication for gout. She also wanted to know if it is ok for her to still get her feet done? I advised her that it should be fine but that I would double check with you YL,RMA

## 2020-07-10 ENCOUNTER — Other Ambulatory Visit: Payer: Self-pay

## 2020-07-10 ENCOUNTER — Other Ambulatory Visit: Payer: Self-pay | Admitting: Internal Medicine

## 2020-07-10 MED ORDER — ALLOPURINOL 100 MG PO TABS: 100.0000 mg | ORAL_TABLET | Freq: Every day | ORAL | 1 refills | Status: DC

## 2020-07-10 NOTE — Telephone Encounter (Signed)
Please send allopurinol 100mg  daily #30/1 refill. Needs f/uin six weeks w/ me

## 2020-07-10 NOTE — Telephone Encounter (Signed)
Medication has been sent and patient is scheduled to come back on 08/13/20 is that ok that's 5 weeks from now? YL,RMA

## 2020-08-13 ENCOUNTER — Ambulatory Visit (INDEPENDENT_AMBULATORY_CARE_PROVIDER_SITE_OTHER): Payer: Medicare Other | Admitting: Internal Medicine

## 2020-08-13 ENCOUNTER — Other Ambulatory Visit: Payer: Self-pay

## 2020-08-13 ENCOUNTER — Encounter: Payer: Self-pay | Admitting: Internal Medicine

## 2020-08-13 VITALS — BP 150/80 | HR 57 | Temp 98.9°F | Ht 62.0 in | Wt 201.2 lb

## 2020-08-13 DIAGNOSIS — Z6836 Body mass index (BMI) 36.0-36.9, adult: Secondary | ICD-10-CM

## 2020-08-13 DIAGNOSIS — E1122 Type 2 diabetes mellitus with diabetic chronic kidney disease: Secondary | ICD-10-CM

## 2020-08-13 DIAGNOSIS — N182 Chronic kidney disease, stage 2 (mild): Secondary | ICD-10-CM | POA: Diagnosis not present

## 2020-08-13 DIAGNOSIS — I129 Hypertensive chronic kidney disease with stage 1 through stage 4 chronic kidney disease, or unspecified chronic kidney disease: Secondary | ICD-10-CM

## 2020-08-13 DIAGNOSIS — M79672 Pain in left foot: Secondary | ICD-10-CM | POA: Diagnosis not present

## 2020-08-13 NOTE — Patient Instructions (Signed)
Diabetes Mellitus and Exercise Exercising regularly is important for overall health, especially for people who have diabetes mellitus. Exercising is not only about losing weight. It has many other health benefits, such as increasing muscle strength and bone density and reducing body fat and stress. This leads to improved fitness, flexibility, and endurance, all of which result in better overall health. What are the benefits of exercise if I have diabetes? Exercise has many benefits for people with diabetes. They include:  Helping to lower and control blood sugar (glucose).  Helping the body to respond better to the hormone insulin by improving insulin sensitivity.  Reducing how much insulin the body needs.  Lowering the risk for heart disease by: ? Lowering "bad" cholesterol and triglyceride levels. ? Increasing "good" cholesterol levels. ? Lowering blood pressure. ? Lowering blood glucose levels. What is my activity plan? Your health care provider or certified diabetes educator can help you make a plan for the type and frequency of exercise that works for you. This is called your activity plan. Be sure to:  Get at least 150 minutes of medium-intensity or high-intensity exercise each week. Exercises may include brisk walking, biking, or water aerobics.  Do stretching and strengthening exercises, such as yoga or weight lifting, at least 2 times a week.  Spread out your activity over at least 3 days of the week.  Get some form of physical activity each day. ? Do not go more than 2 days in a row without some kind of physical activity. ? Avoid being inactive for more than 90 minutes at a time. Take frequent breaks to walk or stretch.  Choose exercises or activities that you enjoy. Set realistic goals.  Start slowly and gradually increase your exercise intensity over time.   How do I manage my diabetes during exercise? Monitor your blood glucose  Check your blood glucose before and  after exercising. If your blood glucose is: ? 240 mg/dL (13.3 mmol/L) or higher before you exercise, check your urine for ketones. These are chemicals created by the liver. If you have ketones in your urine, do not exercise until your blood glucose returns to normal. ? 100 mg/dL (5.6 mmol/L) or lower, eat a snack containing 15-20 grams of carbohydrate. Check your blood glucose 15 minutes after the snack to make sure that your glucose level is above 100 mg/dL (5.6 mmol/L) before you start your exercise.  Know the symptoms of low blood glucose (hypoglycemia) and how to treat it. Your risk for hypoglycemia increases during and after exercise. Follow these tips and your health care provider's instructions  Keep a carbohydrate snack that is fast-acting for use before, during, and after exercise to help prevent or treat hypoglycemia.  Avoid injecting insulin into areas of the body that are going to be exercised. For example, avoid injecting insulin into: ? Your arms, when you are about to play tennis. ? Your legs, when you are about to go jogging.  Keep records of your exercise habits. Doing this can help you and your health care provider adjust your diabetes management plan as needed. Write down: ? Food that you eat before and after you exercise. ? Blood glucose levels before and after you exercise. ? The type and amount of exercise you have done.  Work with your health care provider when you start a new exercise or activity. He or she may need to: ? Make sure that the activity is safe for you. ? Adjust your insulin, other medicines, and food that   you eat.  Drink plenty of water while you exercise. This prevents loss of water (dehydration) and problems caused by a lot of heat in the body (heat stroke).   Where to find more information  American Diabetes Association: www.diabetes.org Summary  Exercising regularly is important for overall health, especially for people who have diabetes  mellitus.  Exercising has many health benefits. It increases muscle strength and bone density and reduces body fat and stress. It also lowers and controls blood glucose.  Your health care provider or certified diabetes educator can help you make an activity plan for the type and frequency of exercise that works for you.  Work with your health care provider to make sure any new activity is safe for you. Also work with your health care provider to adjust your insulin, other medicines, and the food you eat. This information is not intended to replace advice given to you by your health care provider. Make sure you discuss any questions you have with your health care provider. Document Revised: 04/17/2019 Document Reviewed: 04/17/2019 Elsevier Patient Education  2021 Elsevier Inc.  

## 2020-08-13 NOTE — Progress Notes (Signed)
I,Renee Bradley,acting as a Education administrator for Renee Greenland, MD.,have documented all relevant documentation on the behalf of Renee Greenland, MD,as directed by  Renee Greenland, MD while in the presence of Renee Greenland, MD.  This visit occurred during the SARS-CoV-2 public health emergency.  Safety protocols were in place, including screening questions prior to the visit, additional usage of staff PPE, and extensive cleaning of exam room while observing appropriate contact time as indicated for disinfecting solutions.  Subjective:     Patient ID: Renee Bradley , female    DOB: [birth year] , 75 y.o.   MRN: [id number]   Chief Complaint  Patient presents with  . Diabetes  . Hypertension    HPI  She presents today for diabetes and htn check. She is also followed by Endocrinology, Dr. [name] - however, she only sees her twice yearly.   She is also here today for f/u left foot pain. Her sx have improved. She was started on allopurinol at her last visit for gout flare. She states her sx have improved since starting allopurinol. She did not have any issues with the medication.   Diabetes She presents for her follow-up diabetic visit. She has type 2 diabetes mellitus. Her disease course has been stable. There are no hypoglycemic associated symptoms. There are no diabetic associated symptoms. Pertinent negatives for diabetes include no blurred vision and no chest pain. There are no hypoglycemic complications. Diabetic complications include nephropathy. Risk factors for coronary artery disease include diabetes mellitus, dyslipidemia, hypertension, post-menopausal, sedentary lifestyle and obesity. She is compliant with treatment some of the time. Her weight is fluctuating minimally. She is following a diabetic diet. She participates in exercise intermittently. An ACE inhibitor/angiotensin II receptor blocker is not being taken.  Hypertension This is a chronic problem. The current episode started more than  1 year ago. The problem has been gradually improving since onset. The problem is uncontrolled. Pertinent negatives include no blurred vision or chest pain. The current treatment provides moderate improvement. Hypertensive end-organ damage includes kidney disease.     Past Medical History:  Diagnosis Date  . Asthma   . Chest pressure 07/13/2012  . Diabetes mellitus without complication ([place])   . DM (diabetes mellitus) ([place]) 07/13/2012  . HTN (hypertension) 07/13/2012  . Hypercholesterolemia   . Hypertension   . SOB (shortness of breath) 07/13/2012  . Tachycardia, unspecified, "fluttering" 07/13/2012     Family History  Problem Relation Age of Onset  . Heart attack Mother   . Diabetes type II Mother   . Stroke Mother   . Alzheimer's disease Father   . Diabetes type II Brother      Current Outpatient Medications:  .  allopurinol (ZYLOPRIM) 100 MG tablet, Take 1 tablet (100 mg total) by mouth daily., Disp: 30 tablet, Rfl: 1 .  amLODipine (NORVASC) 5 MG tablet, , Disp: , Rfl:  .  aspirin EC 81 MG tablet, Take 81 mg by mouth daily., Disp: , Rfl:  .  atenolol (TENORMIN) 50 MG tablet, TAKE 1 TABLET TWICE A DAY, Disp: 180 tablet, Rfl: 1 .  atorvastatin (LIPITOR) 20 MG tablet, TAKE 1 TABLET DAILY, Disp: 90 tablet, Rfl: 2 .  ergocalciferol (VITAMIN D2) 50000 units capsule, Take 50,000 Units by mouth once a week., Disp: , Rfl:  .  furosemide (LASIX) 20 MG tablet, TAKE 1 TABLET DAILY        (HIKMA/WESTWARD), Disp: 90 tablet, Rfl: 0 .  Lancets (ONETOUCH DELICA PLUS [id number]) MISC, ,  Disp: , Rfl:  .  ONETOUCH VERIO test strip, , Disp: , Rfl:    Allergies  Allergen Reactions  . Janumet [Sitagliptin-Metformin Hcl] Shortness Of Breath  . Januvia [Sitagliptin] Shortness Of Breath  . Kombiglyze [Saxagliptin-Metformin Er] Shortness Of Breath  . Sulfur Itching     Review of Systems  Constitutional: Negative.   Eyes: Negative for blurred vision.  Respiratory: Negative.   Cardiovascular:  Negative.  Negative for chest pain.  Gastrointestinal: Negative.   Musculoskeletal: Positive for arthralgias.  Neurological: Negative.      Today's Vitals   08/13/20 1441  BP: (!) 150/80  Pulse: (!) 57  Temp: 98.9 F (37.2 C)  SpO2: 93%  Weight: 201 lb 3.2 oz (91.3 kg)  Height: _0  (1.575 m)  PainSc: 0-No pain   Body mass index is 36.8 kg/m.  Wt Readings from Last 3 Encounters:  08/13/20 201 lb 3.2 oz (91.3 kg)  07/04/20 201 lb (91.2 kg)  02/07/20 199 lb 12.8 oz (90.6 kg)    Objective:  Physical Exam Vitals and nursing note reviewed.  Constitutional:      Appearance: Normal appearance. She is obese.  HENT:     Head: Normocephalic and atraumatic.  Cardiovascular:     Rate and Rhythm: Normal rate and regular rhythm.     Heart sounds: Normal heart sounds.  Pulmonary:     Effort: Pulmonary effort is normal.     Breath sounds: Normal breath sounds.  Skin:    General: Skin is warm.  Neurological:     General: No focal deficit present.     Mental Status: She is alert.  Psychiatric:        Mood and Affect: Mood normal.        Behavior: Behavior normal.       Assessment And Plan:     1. Type 2 diabetes mellitus with stage 2 chronic kidney disease, without long-term current use of insulin (HCC) Comments: Chronic, I will check labs as listed below. I will forward her results to Dr. [name], her endocrinologist for her review.  - Hemoglobin A1c - CMP14+EGFR  2. Hypertensive nephropathy Comments: Uncontrolled. I will increase amlodipine to 67m daily. Advised to take 2 pills (540m until she runs out). She agrees to rto in 2 wks for nurse visit. I will see her in four to six weeks for re-evaluation. Encouraged to follow low sodium diet, less than [id number].  3. Left foot pain Comments: Improved, sx likely due to gout. I will check uric acid level today. Pt advised to c/w allopurinol daily for now.  - Uric acid  4. Class 2 severe obesity due to excess calories with  serious comorbidity and body mass index (BMI) of 36.0 to 36.9 in adult ([facility]: She is encouraged to strive for BMI less than 30 to decrease cardiac risk. Advised to aim for at least 30 minutes of exercise five days per week.   Patient was given opportunity to ask questions. Patient verbalized understanding of the plan and was able to repeat key elements of the plan. All questions were answered to their satisfaction.  RobMaximino GreenlandD   I, RobMaximino GreenlandD, have reviewed all documentation for this visit. The documentation on 08/13/20 for the exam, diagnosis, procedures, and orders are all accurate and complete.  THE PATIENT IS ENCOURAGED TO PRACTICE SOCIAL DISTANCING DUE TO THE COVID-19 PANDEMIC.

## 2020-08-14 LAB — CMP14+EGFR
ALT: 11 IU/L (ref 0–32)
AST: 16 IU/L (ref 0–40)
Albumin/Globulin Ratio: 2.2 (ref 1.2–2.2)
Albumin: 4.6 g/dL (ref 3.7–4.7)
Alkaline Phosphatase: 81 IU/L (ref 44–121)
BUN/Creatinine Ratio: 15 (ref 12–28)
BUN: 13 mg/dL (ref 8–27)
Bilirubin Total: 0.8 mg/dL (ref 0.0–1.2)
CO2: 23 mmol/L (ref 20–29)
Calcium: 9.5 mg/dL (ref 8.7–10.3)
Chloride: 106 mmol/L (ref 96–106)
Creatinine, Ser: 0.88 mg/dL (ref 0.57–1.00)
GFR calc Af Amer: 75 mL/min/{1.73_m2} (ref >59)
GFR calc non Af Amer: 65 mL/min/{1.73_m2} (ref >59)
Globulin, Total: 2.1 g/dL (ref 1.5–4.5)
Glucose: 104 mg/dL — ABNORMAL HIGH (ref 65–99)
Potassium: 4.2 mmol/L (ref 3.5–5.2)
Sodium: 143 mmol/L (ref 134–144)
Total Protein: 6.7 g/dL (ref 6.0–8.5)

## 2020-08-14 LAB — HEMOGLOBIN A1C
Est. average glucose Bld gHb Est-mCnc: 140 mg/dL
Hgb A1c MFr Bld: 6.5 % — ABNORMAL HIGH (ref 4.8–5.6)

## 2020-08-14 LAB — URIC ACID: Uric Acid: 5.2 mg/dL (ref 3.1–7.9)

## 2020-08-27 ENCOUNTER — Ambulatory Visit: Payer: Medicare Other

## 2020-08-27 ENCOUNTER — Other Ambulatory Visit: Payer: Self-pay

## 2020-08-27 VITALS — BP 134/72 | HR 61 | Temp 98.2°F | Ht 62.0 in | Wt 195.8 lb

## 2020-08-27 DIAGNOSIS — I129 Hypertensive chronic kidney disease with stage 1 through stage 4 chronic kidney disease, or unspecified chronic kidney disease: Secondary | ICD-10-CM

## 2020-08-27 NOTE — Progress Notes (Signed)
Patient is here for blood pressure check.  BP Readings from Last 3 Encounters:  08/27/20 134/72  08/13/20 (!) 150/80  07/04/20 (!) 144/86   She is taking atenolol 50mg  twice a day and amlopidipin 5mg  daily   She is excited about her weight loss Wt Readings from Last 3 Encounters:  08/27/20 195 lb 12.8 oz (88.8 kg)  08/13/20 201 lb 3.2 oz (91.3 kg)  07/04/20 201 lb (91.2 kg)   Patient will follow up with provider in 8 weeks

## 2020-09-22 ENCOUNTER — Other Ambulatory Visit: Payer: Self-pay | Admitting: Internal Medicine

## 2020-09-24 ENCOUNTER — Ambulatory Visit: Payer: Medicare Other

## 2020-09-26 ENCOUNTER — Telehealth: Payer: Self-pay

## 2020-09-26 NOTE — Telephone Encounter (Signed)
The pt was notified that the appt that she has Monday she  accidentally cancelled and she wants to actually keep it. The pt was notified that the appt was still scheduled and that I would make a note that she wants to keep it.

## 2020-09-30 ENCOUNTER — Ambulatory Visit (INDEPENDENT_AMBULATORY_CARE_PROVIDER_SITE_OTHER): Payer: Medicare Other | Admitting: Nurse Practitioner

## 2020-09-30 ENCOUNTER — Other Ambulatory Visit: Payer: Self-pay

## 2020-09-30 ENCOUNTER — Encounter: Payer: Self-pay | Admitting: Nurse Practitioner

## 2020-09-30 VITALS — BP 150/80 | HR 60 | Temp 98.6°F | Ht 62.0 in | Wt 199.0 lb

## 2020-09-30 DIAGNOSIS — I10 Essential (primary) hypertension: Secondary | ICD-10-CM

## 2020-09-30 DIAGNOSIS — R2243 Localized swelling, mass and lump, lower limb, bilateral: Secondary | ICD-10-CM

## 2020-09-30 NOTE — Progress Notes (Signed)
Tomasa Hose as a scribe for Arnette Felts, FNP.,have documented all relevant documentation on the behalf of Arnette Felts, FNP,as directed by  Arnette Felts, FNP while in the presence of Arnette Felts, FNP. This visit occurred during the SARS-CoV-2 public health emergency.  Safety protocols were in place, including screening questions prior to the visit, additional usage of staff PPE, and extensive cleaning of exam room while observing appropriate contact time as indicated for disinfecting solutions.  Subjective:     Patient ID: Renee Bradley , female    DOB: [birth year] , 75 y.o.   MRN: [id number]   Chief Complaint  Patient presents with  . Foot Swelling    (Both)    HPI  Pt here today for swelling in both feet and ankles began about 3 weeks ago. When she wakes up in the morning her swelling is better. No changes to her activity level. She is limits her intake of salt. She drinks mostly water and some coffee. Denies shortness of breath.  She takes furosemide daily. Wears diabetic socks.  She is taking amlodipine 5mg  two tabs daily in the last 2 weeks.     Past Medical History:  Diagnosis Date  . Asthma   . Chest pressure 07/13/2012  . Diabetes mellitus without complication (HCC)   . DM (diabetes mellitus) ([facility]) 07/13/2012  . HTN (hypertension) 07/13/2012  . Hypercholesterolemia   . Hypertension   . SOB (shortness of breath) 07/13/2012  . Tachycardia, unspecified, "fluttering" 07/13/2012     Family History  Problem Relation Age of Onset  . Heart attack Mother   . Diabetes type II Mother   . Stroke Mother   . Alzheimer's disease Father   . Diabetes type II Brother      Current Outpatient Medications:  .  amLODipine (NORVASC) 5 MG tablet, , Disp: , Rfl:  .  aspirin EC 81 MG tablet, Take 81 mg by mouth daily., Disp: , Rfl:  .  atenolol (TENORMIN) 50 MG tablet, TAKE 1 TABLET TWICE A DAY (Patient taking differently: 50 mg.), Disp: 180 tablet, Rfl: 1 .  atorvastatin  (LIPITOR) 20 MG tablet, TAKE 1 TABLET DAILY, Disp: 90 tablet, Rfl: 2 .  ergocalciferol (VITAMIN D2) 50000 units capsule, Take 50,000 Units by mouth once a week., Disp: , Rfl:  .  furosemide (LASIX) 20 MG tablet, TAKE 1 TABLET DAILY        (HIKMA/WESTWARD), Disp: 90 tablet, Rfl: 0 .  Lancets (ONETOUCH DELICA PLUS [id number]) MISC, , Disp: , Rfl:  .  ONETOUCH VERIO test strip, , Disp: , Rfl:  .  allopurinol (ZYLOPRIM) 100 MG tablet, Take 1 tablet (100 mg total) by mouth daily. (Patient not taking: Reported on 09/30/2020), Disp: 30 tablet, Rfl: 1   Allergies  Allergen Reactions  . Janumet [Sitagliptin-Metformin Hcl] Shortness Of Breath  . Januvia [Sitagliptin] Shortness Of Breath  . Kombiglyze [Saxagliptin-Metformin Er] Shortness Of Breath  . Elemental Sulfur Itching     Review of Systems  Constitutional: Negative.  Negative for fatigue.  HENT: Negative.   Respiratory: Negative.   Cardiovascular: Negative.   Gastrointestinal: Negative.   Endocrine: Negative for polydipsia, polyphagia and polyuria.  Musculoskeletal: Positive for joint swelling.       Both feet and ankles   Skin: Negative.   Neurological: Negative for dizziness and headaches.  Psychiatric/Behavioral: Negative.      Today's Vitals   09/30/20 1611  BP: (!) 182/72  Pulse: 60  Temp: 98.6 F (37 C)  TempSrc:  Oral  Weight: 199 lb (90.3 kg)  Height: 5\' 2"  (1.575 m)   Body mass index is 36.4 kg/m.   Objective:  Physical Exam Vitals reviewed.  Constitutional:      General: She is not in acute distress.    Appearance: Normal appearance. She is obese.  HENT:     Head: Normocephalic.     Right Ear: Tympanic membrane, ear canal and external ear normal. There is no impacted cerumen.     Left Ear: Tympanic membrane, ear canal and external ear normal. There is no impacted cerumen.     Nose: Nose normal. No congestion.     Mouth/Throat:     Mouth: Mucous membranes are moist.  Eyes:     Extraocular Movements:  Extraocular movements intact.     Conjunctiva/sclera: Conjunctivae normal.     Pupils: Pupils are equal, round, and reactive to light.  Cardiovascular:     Rate and Rhythm: Normal rate and regular rhythm.     Pulses: Normal pulses.     Heart sounds: Normal heart sounds. No murmur heard.   Pulmonary:     Effort: Pulmonary effort is normal. No respiratory distress.     Breath sounds: Normal breath sounds. No wheezing.  Skin:    General: Skin is warm and dry.     Capillary Refill: Capillary refill takes less than 2 seconds.  Neurological:     General: No focal deficit present.     Mental Status: She is alert and oriented to person, place, and time.     Cranial Nerves: No cranial nerve deficit.  Psychiatric:        Mood and Affect: Mood normal.        Behavior: Behavior normal.        Thought Content: Thought content normal.        Judgment: Judgment normal.         Assessment And Plan:     1. Localized swelling of both lower legs  She is to keep her feet elevated and wear compression socks.  Will have her to cut back on amlodipine to 5 mg   Has trace edema at this time  2. Primary hypertension  Repeat blood pressure is better  She needs to decrease her salt intake     Patient was given opportunity to ask questions. Patient verbalized understanding of the plan and was able to repeat key elements of the plan. All questions were answered to their satisfaction.  , FNP   I, Arnette Felts, FNP, have reviewed all documentation for this visit. The documentation on 09/30/20 for the exam, diagnosis, procedures, and orders are all accurate and complete.  THE PATIENT IS ENCOURAGED TO PRACTICE SOCIAL DISTANCING DUE TO THE COVID-19 PANDEMIC.

## 2020-10-03 ENCOUNTER — Other Ambulatory Visit: Payer: Self-pay

## 2020-10-07 ENCOUNTER — Other Ambulatory Visit: Payer: Self-pay

## 2020-10-07 MED ORDER — AMLODIPINE BESYLATE 5 MG PO TABS
5.0000 mg | ORAL_TABLET | Freq: Every day | ORAL | 1 refills | Status: DC
Start: 2020-10-07 — End: 2022-08-02

## 2020-10-08 ENCOUNTER — Other Ambulatory Visit: Payer: Self-pay

## 2020-10-08 ENCOUNTER — Ambulatory Visit: Payer: Medicare Other

## 2020-10-08 VITALS — BP 138/74 | HR 59 | Temp 98.1°F | Ht 62.0 in | Wt 193.0 lb

## 2020-10-08 DIAGNOSIS — I10 Essential (primary) hypertension: Secondary | ICD-10-CM

## 2020-10-08 NOTE — Progress Notes (Signed)
Pt is here for a BPC. Patient took medicine as directed, boiled egg and wheat toast for breakfast. Has had plentiful water today, tries to exercise when she can, at least twice a week. Provider stated that blood pressure has gotten better, pt should also continue to walk.  BP Readings from Last 3 Encounters:  10/08/20 138/74  09/30/20 (!) 150/80  08/27/20 134/72

## 2020-10-10 ENCOUNTER — Telehealth: Payer: Self-pay

## 2020-10-10 NOTE — Telephone Encounter (Signed)
Spoke with patient about amlodipine medication, she stated that she was on her last pill. Did let pt know that she could go back to local pharmacy to get more before she runs out, which she agreed to do.

## 2020-10-22 DIAGNOSIS — E1165 Type 2 diabetes mellitus with hyperglycemia: Secondary | ICD-10-CM | POA: Diagnosis not present

## 2020-10-22 DIAGNOSIS — E7800 Pure hypercholesterolemia, unspecified: Secondary | ICD-10-CM | POA: Diagnosis not present

## 2020-10-24 ENCOUNTER — Encounter: Payer: Self-pay | Admitting: Internal Medicine

## 2020-10-24 ENCOUNTER — Ambulatory Visit (INDEPENDENT_AMBULATORY_CARE_PROVIDER_SITE_OTHER): Payer: Medicare Other | Admitting: Internal Medicine

## 2020-10-24 ENCOUNTER — Other Ambulatory Visit: Payer: Self-pay

## 2020-10-24 VITALS — BP 138/78 | HR 68 | Temp 98.3°F | Ht 62.0 in | Wt 196.4 lb

## 2020-10-24 DIAGNOSIS — R2243 Localized swelling, mass and lump, lower limb, bilateral: Secondary | ICD-10-CM | POA: Diagnosis not present

## 2020-10-24 DIAGNOSIS — I129 Hypertensive chronic kidney disease with stage 1 through stage 4 chronic kidney disease, or unspecified chronic kidney disease: Secondary | ICD-10-CM

## 2020-10-24 DIAGNOSIS — N182 Chronic kidney disease, stage 2 (mild): Secondary | ICD-10-CM

## 2020-10-24 NOTE — Patient Instructions (Signed)

## 2020-10-24 NOTE — Progress Notes (Signed)
Pura Spice as a Neurosurgeon for [name], MD.,have documented all relevant documentation on the behalf of [name], MD,as directed by  [name], MD while in the presence of [name], MD. This visit occurred during the SARS-CoV-2 public health emergency.  Safety protocols were in place, including screening questions prior to the visit, additional usage of staff PPE, and extensive cleaning of exam room while observing appropriate contact time as indicated for disinfecting solutions.  Subjective:     Patient ID: Renee Bradley , female    DOB: Jun 18, 1946 , 75 y.o.   MRN: [id number]   Chief Complaint  Patient presents with  . Hypertension    HPI  She presents today for bp check. She states the 10mg  dose of amlodipine caused too much LE swelling. She was seen by , DNP on 2/28 and advised to cut back to one 5mg  tab daily. She reports she has cut back on her salt intake, but later admits that she eats salty chips and/or nuts daily. She has noticed the swelling has improved since cutting back to one tablet daily.   Hypertension This is a chronic problem. The current episode started more than 1 year ago. The problem has been gradually improving since onset. The problem is uncontrolled. The current treatment provides moderate improvement. Hypertensive end-organ damage includes kidney disease.     Past Medical History:  Diagnosis Date  . Asthma   . Chest pressure 07/13/2012  . Diabetes mellitus without complication ([facility])   . DM (diabetes mellitus) ([facility]) 07/13/2012  . HTN (hypertension) 07/13/2012  . Hypercholesterolemia   . Hypertension   . SOB (shortness of breath) 07/13/2012  . Tachycardia, unspecified, "fluttering" 07/13/2012     Family History  Problem Relation Age of Onset  . Heart attack Mother   . Diabetes type II Mother   . Stroke Mother   . Alzheimer's disease Father   . Diabetes type II Brother      Current Outpatient Medications:   .  amLODipine (NORVASC) 5 MG tablet, Take 1 tablet (5 mg total) by mouth daily., Disp: 90 tablet, Rfl: 1 .  aspirin EC 81 MG tablet, Take 81 mg by mouth daily., Disp: , Rfl:  .  atenolol (TENORMIN) 50 MG tablet, TAKE 1 TABLET TWICE A DAY (Patient taking differently: 50 mg 2 (two) times daily.), Disp: 180 tablet, Rfl: 1 .  atorvastatin (LIPITOR) 20 MG tablet, TAKE 1 TABLET DAILY, Disp: 90 tablet, Rfl: 2 .  ergocalciferol (VITAMIN D2) 50000 units capsule, Take 50,000 Units by mouth once a week., Disp: , Rfl:  .  furosemide (LASIX) 20 MG tablet, TAKE 1 TABLET DAILY        (HIKMA/WESTWARD), Disp: 90 tablet, Rfl: 0 .  Lancets (ONETOUCH DELICA PLUS LANCET33G) MISC, , Disp: , Rfl:  .  ONETOUCH VERIO test strip, , Disp: , Rfl:  .  allopurinol (ZYLOPRIM) 100 MG tablet, Take 1 tablet (100 mg total) by mouth daily. (Patient not taking: No sig reported), Disp: 30 tablet, Rfl: 1   Allergies  Allergen Reactions  . Janumet [Sitagliptin-Metformin Hcl] Shortness Of Breath  . Januvia [Sitagliptin] Shortness Of Breath  . Kombiglyze [Saxagliptin-Metformin Er] Shortness Of Breath  . Elemental Sulfur Itching     Review of Systems  Constitutional: Negative.   HENT: Negative.   Eyes: Negative.   Respiratory: Negative.   Cardiovascular: Negative.   Gastrointestinal: Negative.   Skin: Negative.      Today's Vitals  10/24/20 1545  BP: 138/78  Pulse: 68  Temp: 98.3 F (36.8 C)  TempSrc: Oral  Weight: 196 lb 6.4 oz (89.1 kg)  Height: 5\' 2"  (1.575 m)  PainSc: 0-No pain   Body mass index is 35.92 kg/m.   Objective:  Physical Exam Vitals and nursing note reviewed.  Constitutional:      Appearance: Normal appearance.  HENT:     Head: Normocephalic and atraumatic.     Nose:     Comments: Masked     Mouth/Throat:     Comments: Masked  Cardiovascular:     Rate and Rhythm: Normal rate and regular rhythm.     Heart sounds: Normal heart sounds.  Pulmonary:     Effort: Pulmonary effort is normal.      Breath sounds: Normal breath sounds.  Musculoskeletal:     Cervical back: Normal range of motion.     Comments: +1 b/l LE edema  Skin:    General: Skin is warm.  Neurological:     General: No focal deficit present.     Mental Status: She is alert.  Psychiatric:        Mood and Affect: Mood normal.        Behavior: Behavior normal.         Assessment And Plan:     1. Hypertensive nephropathy Comments: Fair control. She was advised goal BP is less than 130/80. IMportance of salt restriction was stressed to the patient. She is encouraged to exchange her salty snacks for unsalted varieties. Also advised to look at seasonings - remove everything with salt - garlic salt, seasoning salt, etc. Advised to use fresh herbs, garlic to season her foods.   2. Chronic renal disease, stage II Comments: Chronic. Advised to stay well hydrated and keep BP well controlled to avoid progression of CKD.   3. Localized swelling of both lower legs Comments: Improved. I stressed importance of regular exercise and dietary compliance to improve LE edema.    Patient was given opportunity to ask questions. Patient verbalized understanding of the plan and was able to repeat key elements of the plan. All questions were answered to their satisfaction.   I, , MD, have reviewed all documentation for this visit. The documentation on 10/24/20 for the exam, diagnosis, procedures, and orders are all accurate and complete.   IF YOU HAVE BEEN REFERRED TO A SPECIALIST, IT MAY TAKE 1-2 WEEKS TO SCHEDULE/PROCESS THE REFERRAL. IF YOU HAVE NOT HEARD FROM US/SPECIALIST IN TWO WEEKS, PLEASE GIVE 10/26/20 A CALL AT [phone] X [phone].   THE PATIENT IS ENCOURAGED TO PRACTICE SOCIAL DISTANCING DUE TO THE COVID-19 PANDEMIC.

## 2020-10-29 DIAGNOSIS — R809 Proteinuria, unspecified: Secondary | ICD-10-CM | POA: Diagnosis not present

## 2020-10-29 DIAGNOSIS — E7800 Pure hypercholesterolemia, unspecified: Secondary | ICD-10-CM | POA: Diagnosis not present

## 2020-10-29 DIAGNOSIS — I10 Essential (primary) hypertension: Secondary | ICD-10-CM | POA: Diagnosis not present

## 2020-10-29 DIAGNOSIS — E1165 Type 2 diabetes mellitus with hyperglycemia: Secondary | ICD-10-CM | POA: Diagnosis not present

## 2020-12-15 ENCOUNTER — Other Ambulatory Visit: Payer: Self-pay | Admitting: Internal Medicine

## 2021-01-17 DIAGNOSIS — E7800 Pure hypercholesterolemia, unspecified: Secondary | ICD-10-CM | POA: Diagnosis not present

## 2021-01-24 NOTE — Telephone Encounter (Signed)
Error

## 2021-02-13 ENCOUNTER — Ambulatory Visit: Payer: Medicare Other | Admitting: Internal Medicine

## 2021-02-13 ENCOUNTER — Telehealth: Payer: Self-pay

## 2021-02-13 NOTE — Telephone Encounter (Signed)
This nurse called patient in regards to no show for AWV. Patient stated that all was fine and we rescheduled to 03/13/2021.

## 2021-02-25 ENCOUNTER — Ambulatory Visit: Payer: Medicare Other | Admitting: Internal Medicine

## 2021-03-13 ENCOUNTER — Ambulatory Visit (INDEPENDENT_AMBULATORY_CARE_PROVIDER_SITE_OTHER): Payer: Medicare Other

## 2021-03-13 ENCOUNTER — Other Ambulatory Visit: Payer: Self-pay

## 2021-03-13 VITALS — BP 130/70 | HR 63 | Temp 99.3°F | Ht 61.2 in | Wt 195.2 lb

## 2021-03-13 DIAGNOSIS — I10 Essential (primary) hypertension: Secondary | ICD-10-CM

## 2021-03-13 DIAGNOSIS — Z0000 Encounter for general adult medical examination without abnormal findings: Secondary | ICD-10-CM

## 2021-03-13 LAB — POCT URINALYSIS DIPSTICK
Bilirubin, UA: NEGATIVE
Glucose, UA: NEGATIVE
Ketones, UA: NEGATIVE
Leukocytes, UA: NEGATIVE
Nitrite, UA: NEGATIVE
Protein, UA: NEGATIVE
Spec Grav, UA: 1.015 (ref 1.010–1.025)
Urobilinogen, UA: 0.2 E.U./dL
pH, UA: 5.5 (ref 5.0–8.0)

## 2021-03-13 LAB — POCT UA - MICROALBUMIN
Albumin/Creatinine Ratio, Urine, POC: <30
Creatinine, POC: 200 mg/dL
Microalbumin Ur, POC: 30 mg/L

## 2021-03-13 LAB — CBC WITH DIFFERENTIAL/PLATELET
Basophils Absolute: 0 10*3/uL (ref 0.0–0.2)
Basos: 1 %
EOS (ABSOLUTE): 0 10*3/uL (ref 0.0–0.4)
Eos: 1 %
Hematocrit: 43.4 % (ref 34.0–46.6)
Hemoglobin: 13.9 g/dL (ref 11.1–15.9)
Immature Grans (Abs): 0 10*3/uL (ref 0.0–0.1)
Immature Granulocytes: 0 %
Lymphocytes Absolute: 1.3 10*3/uL (ref 0.7–3.1)
Lymphs: 34 %
MCH: 27.9 pg (ref 26.6–33.0)
MCHC: 32 g/dL (ref 31.5–35.7)
MCV: 87 fL (ref 79–97)
Monocytes Absolute: 0.2 10*3/uL (ref 0.1–0.9)
Monocytes: 6 %
Neutrophils Absolute: 2.2 10*3/uL (ref 1.4–7.0)
Neutrophils: 58 %
Platelets: 214 10*3/uL (ref 150–450)
RBC: 4.99 x10E6/uL (ref 3.77–5.28)
RDW: 14.4 % (ref 11.7–15.4)
WBC: 3.8 10*3/uL (ref 3.4–10.8)

## 2021-03-13 NOTE — Addendum Note (Signed)
Addended by: Barb Merino on: 03/13/2021 04:36 PM   Modules accepted: Orders

## 2021-03-13 NOTE — Patient Instructions (Signed)
Renee Bradley , Thank you for taking time to come for your Medicare Wellness Visit. I appreciate your ongoing commitment to your health goals. Please review the following plan we discussed and let me know if I can assist you in the future.   Screening recommendations/referrals: Colonoscopy: decline at this time Mammogram: patient to schedule Bone Density: completed 09/27/2017 Recommended yearly ophthalmology/optometry visit for glaucoma screening and checkup Recommended yearly dental visit for hygiene and checkup  Vaccinations: Influenza vaccine: due Pneumococcal vaccine: decline Tdap vaccine: completed 07/11/2013, due 07/12/2023 Shingles vaccine: discussed   Covid-19: 11/03/2019, 10/13/2019  Advanced directives: Advance directive discussed with you today. Even though you declined this today please call our office should you change your mind and we can give you the proper paperwork for you to fill out.  Conditions/risks identified: none  Next appointment: Follow up in one year for your annual wellness visit    Preventive Care 65 Years and Older, Female Preventive care refers to lifestyle choices and visits with your health care provider that can promote health and wellness. What does preventive care include? A yearly physical exam. This is also called an annual well check. Dental exams once or twice a year. Routine eye exams. Ask your health care provider how often you should have your eyes checked. Personal lifestyle choices, including: Daily care of your teeth and gums. Regular physical activity. Eating a healthy diet. Avoiding tobacco and drug use. Limiting alcohol use. Practicing safe sex. Taking low-dose aspirin every day. Taking vitamin and mineral supplements as recommended by your health care provider. What happens during an annual well check? The services and screenings done by your health care provider during your annual well check will depend on your age, overall health,  lifestyle risk factors, and family history of disease. Counseling  Your health care provider may ask you questions about your: Alcohol use. Tobacco use. Drug use. Emotional well-being. Home and relationship well-being. Sexual activity. Eating habits. History of falls. Memory and ability to understand (cognition). Work and work Astronomer. Reproductive health. Screening  You may have the following tests or measurements: Height, weight, and BMI. Blood pressure. Lipid and cholesterol levels. These may be checked every 5 years, or more frequently if you are over 29 years old. Skin check. Lung cancer screening. You may have this screening every year starting at age 30 if you have a 30-pack-year history of smoking and currently smoke or have quit within the past 15 years. Fecal occult blood test (FOBT) of the stool. You may have this test every year starting at age 90. Flexible sigmoidoscopy or colonoscopy. You may have a sigmoidoscopy every 5 years or a colonoscopy every 10 years starting at age 5. Hepatitis C blood test. Hepatitis B blood test. Sexually transmitted disease (STD) testing. Diabetes screening. This is done by checking your blood sugar (glucose) after you have not eaten for a while (fasting). You may have this done every 1-3 years. Bone density scan. This is done to screen for osteoporosis. You may have this done starting at age 31. Mammogram. This may be done every 1-2 years. Talk to your health care provider about how often you should have regular mammograms. Talk with your health care provider about your test results, treatment options, and if necessary, the need for more tests. Vaccines  Your health care provider may recommend certain vaccines, such as: Influenza vaccine. This is recommended every year. Tetanus, diphtheria, and acellular pertussis (Tdap, Td) vaccine. You may need a Td booster every 10 years.  Zoster vaccine. You may need this after age  32. Pneumococcal 13-valent conjugate (PCV13) vaccine. One dose is recommended after age 71. Pneumococcal polysaccharide (PPSV23) vaccine. One dose is recommended after age 57. Talk to your health care provider about which screenings and vaccines you need and how often you need them. This information is not intended to replace advice given to you by your health care provider. Make sure you discuss any questions you have with your health care provider. Document Released: 08/16/2015 Document Revised: 04/08/2016 Document Reviewed: 05/21/2015 Elsevier Interactive Patient Education  2017 [place] Prevention in the Home Falls can cause injuries. They can happen to people of all ages. There are many things you can do to make your home safe and to help prevent falls. What can I do on the outside of my home? Regularly fix the edges of walkways and driveways and fix any cracks. Remove anything that might make you trip as you walk through a door, such as a raised step or threshold. Trim any bushes or trees on the path to your home. Use bright outdoor lighting. Clear any walking paths of anything that might make someone trip, such as rocks or tools. Regularly check to see if handrails are loose or broken. Make sure that both sides of any steps have handrails. Any raised decks and porches should have guardrails on the edges. Have any leaves, snow, or ice cleared regularly. Use sand or salt on walking paths during winter. Clean up any spills in your garage right away. This includes oil or grease spills. What can I do in the bathroom? Use night lights. Install grab bars by the toilet and in the tub and shower. Do not use towel bars as grab bars. Use non-skid mats or decals in the tub or shower. If you need to sit down in the shower, use a plastic, non-slip stool. Keep the floor dry. Clean up any water that spills on the floor as soon as it happens. Remove soap buildup in the tub or shower  regularly. Attach bath mats securely with double-sided non-slip rug tape. Do not have throw rugs and other things on the floor that can make you trip. What can I do in the bedroom? Use night lights. Make sure that you have a light by your bed that is easy to reach. Do not use any sheets or blankets that are too big for your bed. They should not hang down onto the floor. Have a firm chair that has side arms. You can use this for support while you get dressed. Do not have throw rugs and other things on the floor that can make you trip. What can I do in the kitchen? Clean up any spills right away. Avoid walking on wet floors. Keep items that you use a lot in easy-to-reach places. If you need to reach something above you, use a strong step stool that has a grab bar. Keep electrical cords out of the way. Do not use floor polish or wax that makes floors slippery. If you must use wax, use non-skid floor wax. Do not have throw rugs and other things on the floor that can make you trip. What can I do with my stairs? Do not leave any items on the stairs. Make sure that there are handrails on both sides of the stairs and use them. Fix handrails that are broken or loose. Make sure that handrails are as long as the stairways. Check any carpeting to make sure that  it is firmly attached to the stairs. Fix any carpet that is loose or worn. Avoid having throw rugs at the top or bottom of the stairs. If you do have throw rugs, attach them to the floor with carpet tape. Make sure that you have a light switch at the top of the stairs and the bottom of the stairs. If you do not have them, ask someone to add them for you. What else can I do to help prevent falls? Wear shoes that: Do not have high heels. Have rubber bottoms. Are comfortable and fit you well. Are closed at the toe. Do not wear sandals. If you use a stepladder: Make sure that it is fully opened. Do not climb a closed stepladder. Make sure that  both sides of the stepladder are locked into place. Ask someone to hold it for you, if possible. Clearly mark and make sure that you can see: Any grab bars or handrails. First and last steps. Where the edge of each step is. Use tools that help you move around (mobility aids) if they are needed. These include: Canes. Walkers. Scooters. Crutches. Turn on the lights when you go into a dark area. Replace any light bulbs as soon as they burn out. Set up your furniture so you have a clear path. Avoid moving your furniture around. If any of your floors are uneven, fix them. If there are any pets around you, be aware of where they are. Review your medicines with your doctor. Some medicines can make you feel dizzy. This can increase your chance of falling. Ask your doctor what other things that you can do to help prevent falls. This information is not intended to replace advice given to you by your health care provider. Make sure you discuss any questions you have with your health care provider. Document Released: 05/16/2009 Document Revised: 12/26/2015 Document Reviewed: 08/24/2014 Elsevier Interactive Patient Education  2017 [employer].

## 2021-03-13 NOTE — Progress Notes (Signed)
This visit occurred during the SARS-CoV-2 public health emergency.  Safety protocols were in place, including screening questions prior to the visit, additional usage of staff PPE, and extensive cleaning of exam room while observing appropriate contact time as indicated for disinfecting solutions.  Subjective:   Renee Bradley is a 75 y.o. female who presents for Medicare Annual (Subsequent) preventive examination.  Review of Systems     Cardiac Risk Factors include: advanced age (>33men, >72 women);diabetes mellitus;hypertension;dyslipidemia;obesity (BMI >30kg/m2);sedentary lifestyle     Objective:    Today's Vitals   03/13/21 1538  BP: (!) 158/70  Pulse: 63  Temp: 99.3 F (37.4 C)  TempSrc: Oral  SpO2: 95%  Weight: 195 lb 3.2 oz (88.5 kg)  Height: 5' 1.2" (1.554 m)   Body mass index is 36.64 kg/m.  Advanced Directives 03/13/2021 02/07/2020 02/02/2019 03/25/2015 03/03/2015 10/30/2014 09/29/2014  Does Patient Have a Medical Advance Directive? No No No Yes No No No  Type of Advance Directive - - - Healthcare Power of Attorney - - -  Copy of Healthcare Power of Attorney in Chart? - - - Yes - - -  Would patient like information on creating a medical advance directive? No - Patient declined No - Patient declined Yes (MAU/Ambulatory/Procedural Areas - Information given) - - No - patient declined information No - patient declined information    Current Medications (verified) Outpatient Encounter Medications as of 03/13/2021  Medication Sig   amLODipine (NORVASC) 5 MG tablet Take 1 tablet (5 mg total) by mouth daily.   aspirin EC 81 MG tablet Take 81 mg by mouth daily.   atenolol (TENORMIN) 50 MG tablet TAKE 1 TABLET TWICE A DAY   ergocalciferol (VITAMIN D2) 50000 units capsule Take 50,000 Units by mouth once a week.   furosemide (LASIX) 20 MG tablet TAKE 1 TABLET DAILY        (HIKMA/WESTWARD)   Lancets (ONETOUCH DELICA PLUS LANCET33G) MISC    ONETOUCH VERIO test strip    rosuvastatin  (CRESTOR) 20 MG tablet Take by mouth.   allopurinol (ZYLOPRIM) 100 MG tablet Take 1 tablet (100 mg total) by mouth daily. (Patient not taking: No sig reported)   atorvastatin (LIPITOR) 20 MG tablet TAKE 1 TABLET DAILY (Patient not taking: Reported on 03/13/2021)   No facility-administered encounter medications on file as of 03/13/2021.    Allergies (verified) Janumet [sitagliptin-metformin hcl], Januvia [sitagliptin], Kombiglyze [saxagliptin-metformin er], and Elemental sulfur   History: Past Medical History:  Diagnosis Date   Asthma    Chest pressure 07/13/2012   Diabetes mellitus without complication (HCC)    DM (diabetes mellitus) (HCC) 07/13/2012   HTN (hypertension) 07/13/2012   Hypercholesterolemia    Hypertension    SOB (shortness of breath) 07/13/2012   Tachycardia, unspecified, "fluttering" 07/13/2012   Past Surgical History:  Procedure Laterality Date   ABDOMINAL HYSTERECTOMY     Family History  Problem Relation Age of Onset   Heart attack Mother    Diabetes type II Mother    Stroke Mother    Alzheimer's disease Father    Diabetes type II Brother    Social History   Socioeconomic History   Marital status: Married    Spouse name: Not on file   Number of children: Not on file   Years of education: Not on file   Highest education level: Not on file  Occupational History   Occupation: retired  Tobacco Use   Smoking status: Former    Years: 2.00  Types: Cigarettes    Quit date: 05/13/1983    Years since quitting: 37.8   Smokeless tobacco: Never  Vaping Use   Vaping Use: Never used  Substance and Sexual Activity   Alcohol use: No   Drug use: No   Sexual activity: Yes  Other Topics Concern   Not on file  Social History Narrative   Not on file   Social Determinants of Health   Financial Resource Strain: Low Risk    Difficulty of Paying Living Expenses: Not hard at all  Food Insecurity: No Food Insecurity   Worried About Programme researcher, broadcasting/film/video in the  Last Year: Never true   Ran Out of Food in the Last Year: Never true  Transportation Needs: No Transportation Needs   Lack of Transportation (Medical): No   Lack of Transportation (Non-Medical): No  Physical Activity: Inactive   Days of Exercise per Week: 0 days   Minutes of Exercise per Session: 0 min  Stress: No Stress Concern Present   Feeling of Stress : Not at all  Social Connections: Not on file    Tobacco Counseling Counseling given: Not Answered   Clinical Intake:  Pre-visit preparation completed: Yes  Pain : No/denies pain     Nutritional Status: BMI > 30  Obese Nutritional Risks: None Diabetes: Yes  How often do you need to have someone help you when you read instructions, pamphlets, or other written materials from your doctor or pharmacy?: 1 - Never What is the last grade level you completed in school?: 14 yrs  Diabetic? Yes Nutrition Risk Assessment:  Has the patient had any N/V/D within the last 2 months?  No  Does the patient have any non-healing wounds?  No  Has the patient had any unintentional weight loss or weight gain?  No   Diabetes:  Is the patient diabetic?  Yes  If diabetic, was a CBG obtained today?  No  Did the patient bring in their glucometer from home?  No  How often do you monitor your CBG's? daily.   Financial Strains and Diabetes Management:  Are you having any financial strains with the device, your supplies or your medication? No .  Does the patient want to be seen by Chronic Care Management for management of their diabetes?  No  Would the patient like to be referred to a Nutritionist or for Diabetic Management?  No   Diabetic Exams:  Diabetic Eye Exam: Completed 11/2020 Diabetic Foot Exam: Overdue, Pt has been advised about the importance in completing this exam. Pt is scheduled for diabetic foot exam on next appointment.   Interpreter Needed?: No  Information entered by :: NAllen LPN   Activities of Daily Living In your  present state of health, do you have any difficulty performing the following activities: 03/13/2021  Hearing? N  Vision? N  Difficulty concentrating or making decisions? N  Walking or climbing stairs? Y  Comment due to knees  Dressing or bathing? N  Doing errands, shopping? N  Preparing Food and eating ? N  Using the Toilet? N  In the past six months, have you accidently leaked urine? Y  Do you have problems with loss of bowel control? N  Managing your Medications? N  Managing your Finances? N  Housekeeping or managing your Housekeeping? N  Some recent data might be hidden    Patient Care Team: Dorothyann Peng, MD as PCP - General (Internal Medicine)  Indicate any recent Medical Services you may have received  from other than [facility] providers in the past year (date may be approximate).     Assessment:   This is a routine wellness examination for [place]ina.  Hearing/Vision screen No results found.  Dietary issues and exercise activities discussed: Current Exercise Habits: The patient does not participate in regular exercise at present   Goals Addressed             This Visit's Progress    Patient Stated       03/13/2021, eat healthy       Depression Screen PHQ 2/9 Scores 03/13/2021 02/07/2020 06/19/2019 02/02/2019 08/17/2018 07/13/2018 06/14/2018  PHQ - 2 Score 0 0 0 0 0 0 0  PHQ- 9 Score - - - 0 - - -    Fall Risk Fall Risk  03/13/2021 02/07/2020 06/19/2019 02/02/2019 08/17/2018  Falls in the past year? 0 0 0 0 0  Comment - - - - -  Risk for fall due to : Medication side effect Medication side effect - Medication side effect -  Follow up Falls evaluation completed;Education provided;Falls prevention discussed Falls evaluation completed;Education provided;Falls prevention discussed - Falls evaluation completed;Education provided;Falls prevention discussed -    FALL RISK PREVENTION PERTAINING TO THE HOME:  Any stairs in or around the home? No  If so, are there any without  handrails?  N/a Home free of loose throw rugs in walkways, pet beds, electrical cords, etc? Yes  Adequate lighting in your home to reduce risk of falls? Yes   ASSISTIVE DEVICES UTILIZED TO PREVENT FALLS:  Life alert? No  Use of a cane, walker or w/c? No  Grab bars in the bathroom? Yes  Shower chair or bench in shower? No  Elevated toilet seat or a handicapped toilet? Yes   TIMED UP AND GO:  Was the test performed? No .     Gait slow and steady without use of assistive device  Cognitive Function:     6CIT Screen 03/13/2021 02/07/2020 02/02/2019  What Year? 0 points 0 points 0 points  What month? 0 points 0 points 0 points  What time? 0 points 0 points 0 points  Count back from 20 0 points 0 points 0 points  Months in reverse 0 points 0 points 0 points  Repeat phrase 4 points 4 points 0 points  Total Score 4 4 0    Immunizations Immunization History  Administered Date(s) Administered   DTaP 07/11/2013   Fluad Quad(high Dose 65+) 05/31/2020   Influenza, High Dose Seasonal PF 06/14/2018, 05/16/2019   PFIZER(Purple Top)SARS-COV-2 Vaccination 10/13/2019, 11/03/2019    TDAP status: Up to date  Flu Vaccine status: Due, Education has been provided regarding the importance of this vaccine. Advised may receive this vaccine at local pharmacy or Health Dept. Aware to provide a copy of the vaccination record if obtained from local pharmacy or Health Dept. Verbalized acceptance and understanding.  Pneumococcal vaccine status: Declined,  Education has been provided regarding the importance of this vaccine but patient still declined. Advised may receive this vaccine at local pharmacy or Health Dept. Aware to provide a copy of the vaccination record if obtained from local pharmacy or Health Dept. Verbalized acceptance and understanding.   Covid-19 vaccine status: Completed vaccines  Qualifies for Shingles Vaccine? Yes   Zostavax completed No   Shingrix Completed?: No.    Education has  been provided regarding the importance of this vaccine. Patient has been advised to call insurance company to determine out of pocket expense if they have  not yet received this vaccine. Advised may also receive vaccine at local pharmacy or Health Dept. Verbalized acceptance and understanding.  Screening Tests Health Maintenance  Topic Date Due   OPHTHALMOLOGY EXAM  Never done   Zoster Vaccines- Shingrix (1 of 2) Never done   COLONOSCOPY (Pts 45-12yrs Insurance coverage will need to be confirmed)  Never done   PNA vac Low Risk Adult (1 of 2 - PCV13) Never done   MAMMOGRAM  09/28/2019   COVID-19 Vaccine (3 - Pfizer risk series) 12/01/2019   FOOT EXAM  02/02/2020   URINE MICROALBUMIN  02/06/2021   HEMOGLOBIN A1C  02/10/2021   INFLUENZA VACCINE  03/03/2021   TETANUS/TDAP  07/12/2023   DEXA SCAN  Completed   Hepatitis C Screening  Completed   HPV VACCINES  Aged Out    Health Maintenance  Health Maintenance Due  Topic Date Due   OPHTHALMOLOGY EXAM  Never done   Zoster Vaccines- Shingrix (1 of 2) Never done   COLONOSCOPY (Pts 45-10yrs Insurance coverage will need to be confirmed)  Never done   PNA vac Low Risk Adult (1 of 2 - PCV13) Never done   MAMMOGRAM  09/28/2019   COVID-19 Vaccine (3 - Pfizer risk series) 12/01/2019   FOOT EXAM  02/02/2020   URINE MICROALBUMIN  02/06/2021   HEMOGLOBIN A1C  02/10/2021   INFLUENZA VACCINE  03/03/2021    Colorectal cancer screening: decline  Mammogram status: patient to schedule  Bone Density status: Completed 09/27/2017.   Lung Cancer Screening: (Low Dose CT Chest recommended if Age 75-80 years, 30 pack-year currently smoking OR have quit w/in 15years.) does not qualify.   Lung Cancer Screening Referral: no  Additional Screening:  Hepatitis C Screening: does qualify; Completed 02/07/2020  Vision Screening: Recommended annual ophthalmology exams for early detection of glaucoma and other disorders of the eye. Is the patient up to date with  their annual eye exam?  Yes  Who is the provider or what is the name of the office in which the patient attends annual eye exams? Dr.Sherman If pt is not established with a provider, would they like to be referred to a provider to establish care? No .   Dental Screening: Recommended annual dental exams for proper oral hygiene  Community Resource Referral / Chronic Care Management: CRR required this visit?  No   CCM required this visit?  No      Plan:     I have personally reviewed and noted the following in the patient's chart:   Medical and social history Use of alcohol, tobacco or illicit drugs  Current medications and supplements including opioid prescriptions.  Functional ability and status Nutritional status Physical activity Advanced directives List of other physicians Hospitalizations, surgeries, and ER visits in previous 12 months Vitals Screenings to include cognitive, depression, and falls Referrals and appointments  In addition, I have reviewed and discussed with patient certain preventive protocols, quality metrics, and best practice recommendations. A written personalized care plan for preventive services as well as general preventive health recommendations were provided to patient.     Barb Merino, LPN   09/22/2540   Nurse Notes:

## 2021-03-17 ENCOUNTER — Other Ambulatory Visit: Payer: Self-pay | Admitting: Internal Medicine

## 2021-03-27 ENCOUNTER — Ambulatory Visit: Payer: Medicare Other

## 2021-04-17 ENCOUNTER — Other Ambulatory Visit: Payer: Self-pay

## 2021-04-17 ENCOUNTER — Other Ambulatory Visit (INDEPENDENT_AMBULATORY_CARE_PROVIDER_SITE_OTHER): Payer: Medicare Other

## 2021-04-17 DIAGNOSIS — R319 Hematuria, unspecified: Secondary | ICD-10-CM | POA: Diagnosis not present

## 2021-04-17 LAB — POCT URINALYSIS DIPSTICK
Bilirubin, UA: NEGATIVE
Blood, UA: POSITIVE
Glucose, UA: NEGATIVE
Ketones, UA: NEGATIVE
Leukocytes, UA: NEGATIVE
Nitrite, UA: NEGATIVE
Protein, UA: NEGATIVE
Spec Grav, UA: 1.02 (ref 1.010–1.025)
Urobilinogen, UA: 0.2 E.U./dL
pH, UA: 5.5 (ref 5.0–8.0)

## 2021-04-18 ENCOUNTER — Other Ambulatory Visit: Payer: Self-pay

## 2021-04-18 ENCOUNTER — Telehealth: Payer: Self-pay

## 2021-04-18 NOTE — Telephone Encounter (Signed)
I left the pt a message to call back for her urine results.

## 2021-04-22 ENCOUNTER — Other Ambulatory Visit: Payer: Self-pay | Admitting: Nurse Practitioner

## 2021-04-22 DIAGNOSIS — R319 Hematuria, unspecified: Secondary | ICD-10-CM

## 2021-06-08 ENCOUNTER — Other Ambulatory Visit: Payer: Self-pay | Admitting: Internal Medicine

## 2021-06-11 DIAGNOSIS — R310 Gross hematuria: Secondary | ICD-10-CM | POA: Diagnosis not present

## 2021-06-18 DIAGNOSIS — I700 Atherosclerosis of aorta: Secondary | ICD-10-CM | POA: Diagnosis not present

## 2021-06-18 DIAGNOSIS — K5730 Diverticulosis of large intestine without perforation or abscess without bleeding: Secondary | ICD-10-CM | POA: Diagnosis not present

## 2021-06-18 DIAGNOSIS — K8020 Calculus of gallbladder without cholecystitis without obstruction: Secondary | ICD-10-CM | POA: Diagnosis not present

## 2021-06-18 DIAGNOSIS — R310 Gross hematuria: Secondary | ICD-10-CM | POA: Diagnosis not present

## 2021-06-26 ENCOUNTER — Other Ambulatory Visit: Payer: Self-pay | Admitting: Internal Medicine

## 2021-07-17 ENCOUNTER — Ambulatory Visit (INDEPENDENT_AMBULATORY_CARE_PROVIDER_SITE_OTHER): Payer: Medicare Other | Admitting: Internal Medicine

## 2021-07-17 ENCOUNTER — Encounter: Payer: Self-pay | Admitting: Internal Medicine

## 2021-07-17 ENCOUNTER — Other Ambulatory Visit: Payer: Self-pay

## 2021-07-17 VITALS — BP 136/88 | HR 51 | Temp 98.1°F | Ht 61.2 in | Wt 196.4 lb

## 2021-07-17 DIAGNOSIS — E1122 Type 2 diabetes mellitus with diabetic chronic kidney disease: Secondary | ICD-10-CM | POA: Diagnosis not present

## 2021-07-17 DIAGNOSIS — M1A9XX0 Chronic gout, unspecified, without tophus (tophi): Secondary | ICD-10-CM | POA: Diagnosis not present

## 2021-07-17 DIAGNOSIS — Z6836 Body mass index (BMI) 36.0-36.9, adult: Secondary | ICD-10-CM | POA: Diagnosis not present

## 2021-07-17 DIAGNOSIS — I10 Essential (primary) hypertension: Secondary | ICD-10-CM | POA: Diagnosis not present

## 2021-07-17 DIAGNOSIS — E7800 Pure hypercholesterolemia, unspecified: Secondary | ICD-10-CM | POA: Diagnosis not present

## 2021-07-17 DIAGNOSIS — Z23 Encounter for immunization: Secondary | ICD-10-CM | POA: Diagnosis not present

## 2021-07-17 DIAGNOSIS — N182 Chronic kidney disease, stage 2 (mild): Secondary | ICD-10-CM | POA: Diagnosis not present

## 2021-07-17 NOTE — Progress Notes (Signed)
I,Katawbba Wiggins,acting as a Education administrator for [name], MD.,have documented all relevant documentation on the behalf of [name], MD,as directed by  [name], MD while in the presence of [name], MD.  This visit occurred during the SARS-CoV-2 public health emergency.  Safety protocols were in place, including screening questions prior to the visit, additional usage of staff PPE, and extensive cleaning of exam room while observing appropriate contact time as indicated for disinfecting solutions.  Subjective:     Patient ID: Renee Bradley , female    DOB: 1946-07-03 , 75 y.o.   MRN: [id number]   Chief Complaint  Patient presents with   Diabetes   Hypertension    HPI  She presents today for diabetes and htn check. She is also followed by Dr. [name], endocrinology. However, states it has been more than six months since her last visit. She reports compliance with meds. She denies headaches, chest pain and shortness of breath.   Diabetes She presents for her follow-up diabetic visit. She has type 2 diabetes mellitus. Her disease course has been stable. There are no hypoglycemic associated symptoms. There are no diabetic associated symptoms. Pertinent negatives for diabetes include no blurred vision and no chest pain. There are no hypoglycemic complications. Diabetic complications include nephropathy. Risk factors for coronary artery disease include diabetes mellitus, dyslipidemia, hypertension, post-menopausal, sedentary lifestyle and obesity. She is compliant with treatment some of the time. Her weight is fluctuating minimally. She is following a diabetic diet. She participates in exercise intermittently. An ACE inhibitor/angiotensin II receptor blocker is not being taken.  Hypertension This is a chronic problem. The current episode started more than 1 year ago. The problem has been gradually improving since onset. The problem is uncontrolled. Pertinent negatives include no  blurred vision or chest pain. The current treatment provides moderate improvement. Hypertensive end-organ damage includes kidney disease.    Past Medical History:  Diagnosis Date   Asthma    Chest pressure 07/13/2012   Diabetes mellitus without complication (HCC)    DM (diabetes mellitus) ([place]) 07/13/2012   HTN (hypertension) 07/13/2012   Hypercholesterolemia    Hypertension    SOB (shortness of breath) 07/13/2012   Tachycardia, unspecified, "fluttering" 07/13/2012     Family History  Problem Relation Age of Onset   Heart attack Mother    Diabetes type II Mother    Stroke Mother    Alzheimer's disease Father    Diabetes type II Brother      Current Outpatient Medications:    amLODipine (NORVASC) 5 MG tablet, Take 1 tablet (5 mg total) by mouth daily., Disp: 90 tablet, Rfl: 1   aspirin EC 81 MG tablet, Take 81 mg by mouth daily., Disp: , Rfl:    atenolol (TENORMIN) 50 MG tablet, TAKE 1 TABLET TWICE A DAY, Disp: 180 tablet, Rfl: 1   ergocalciferol (VITAMIN D2) 50000 units capsule, Take 50,000 Units by mouth once a week., Disp: , Rfl:    furosemide (LASIX) 20 MG tablet, TAKE 1 TABLET DAILY        (HIKMA/WESTWARD), Disp: 90 tablet, Rfl: 0   Lancets (ONETOUCH DELICA PLUS [id number]) MISC, , Disp: , Rfl:    ONETOUCH VERIO test strip, , Disp: , Rfl:    allopurinol (ZYLOPRIM) 100 MG tablet, Take 1 tablet (100 mg total) by mouth daily. (Patient not taking: Reported on 09/30/2020), Disp: 30 tablet, Rfl: 1   rosuvastatin (CRESTOR) 20 MG tablet, Take by mouth., Disp: , Rfl:  Allergies  Allergen Reactions   Janumet [Sitagliptin-Metformin Hcl] Shortness Of Breath   Januvia [Sitagliptin] Shortness Of Breath   Kombiglyze [Saxagliptin-Metformin Er] Shortness Of Breath   Elemental Sulfur Itching     Review of Systems  Constitutional: Negative.   HENT: Negative.    Eyes: Negative.  Negative for blurred vision.  Respiratory: Negative.    Cardiovascular: Negative.  Negative for chest  pain.  Psychiatric/Behavioral: Negative.      Today's Vitals   07/17/21 1618  BP: 136/88  Pulse: (!) 51  Temp: 98.1 F (36.7 C)  Weight: 196 lb 6.4 oz (89.1 kg)  Height: 5' 1.2" (1.554 m)   Body mass index is 36.87 kg/m.  BP Readings from Last 3 Encounters:  07/17/21 136/88  03/13/21 130/70  10/24/20 138/78   Wt Readings from Last 3 Encounters:  07/17/21 196 lb 6.4 oz (89.1 kg)  03/13/21 195 lb 3.2 oz (88.5 kg)  10/24/20 196 lb 6.4 oz (89.1 kg)  . Objective:  Physical Exam Vitals and nursing note reviewed.  Constitutional:      Appearance: Normal appearance.  HENT:     Head: Normocephalic and atraumatic.     Nose:     Comments: Masked     Mouth/Throat:     Comments: Masked  Eyes:     Extraocular Movements: Extraocular movements intact.  Cardiovascular:     Rate and Rhythm: Normal rate and regular rhythm.     Heart sounds: Normal heart sounds.  Pulmonary:     Effort: Pulmonary effort is normal.     Breath sounds: Normal breath sounds.  Skin:    General: Skin is warm.  Neurological:     General: No focal deficit present.     Mental Status: She is alert.  Psychiatric:        Mood and Affect: Mood normal.        Behavior: Behavior normal.        Assessment And Plan:     1. Type 2 diabetes mellitus with stage 2 chronic kidney disease, without long-term current use of insulin (HCC) Comments: Chronic, advised to limit intake of sweetened beverages, increase daily activity and stay well hydrated. She agrees to rto in 4 months for re-evaluation.  - Hemoglobin A1c - CMP14+EGFR - Lipid panel  2. Primary hypertension Comments: Chronic ,uncontrolled. Goal BP <130/80. She is encouraged to take meds as prescribed and to follow a low sodium diet.  - CMP14+EGFR - Lipid panel  3. Chronic gout without tophus, unspecified cause, unspecified site Comments: Chronic, I will check uric acid level today.  - Uric acid  4. Pure hypercholesterolemia Comments: Chronic, I  will check lipid panel today. She is encouraged to take rosuvastatin 54m daily as prescribed.  - Lipid panel  5. Class 2 severe obesity due to excess calories with serious comorbidity and body mass index (BMI) of 36.0 to 36.9 in adult ([facility] Comments: She is encouraged to strive for BMI less than 30 to decrease cardiac risk. Advised to aim for at least 150 minutes of exercise per week.    6. Need for vaccination - Flu Vaccine QUAD High Dose(Fluad)   She is encouraged to strive for BMI less than 30 to decrease cardiac risk. Advised to aim for at least 150 minutes of exercise per week.   Patient was given opportunity to ask questions. Patient verbalized understanding of the plan and was able to repeat key elements of the plan. All questions were answered to their satisfaction.  I, [name], MD, have reviewed all documentation for this visit. The documentation on 07/31/21 for the exam, diagnosis, procedures, and orders are all accurate and complete.   IF YOU HAVE BEEN REFERRED TO A SPECIALIST, IT MAY TAKE 1-2 WEEKS TO SCHEDULE/PROCESS THE REFERRAL. IF YOU HAVE NOT HEARD FROM US/SPECIALIST IN TWO WEEKS, PLEASE GIVE [place] A CALL AT [phone].   THE PATIENT IS ENCOURAGED TO PRACTICE SOCIAL DISTANCING DUE TO THE COVID-19 PANDEMIC.

## 2021-07-17 NOTE — Patient Instructions (Signed)

## 2021-07-18 LAB — CMP14+EGFR
ALT: 12 IU/L (ref 0–32)
AST: 16 IU/L (ref 0–40)
Albumin/Globulin Ratio: 2.4 — ABNORMAL HIGH (ref 1.2–2.2)
Albumin: 4.7 g/dL (ref 3.7–4.7)
Alkaline Phosphatase: 80 IU/L (ref 44–121)
BUN/Creatinine Ratio: 17 (ref 12–28)
BUN: 15 mg/dL (ref 8–27)
Bilirubin Total: 1 mg/dL (ref 0.0–1.2)
CO2: 23 mmol/L (ref 20–29)
Calcium: 9.6 mg/dL (ref 8.7–10.3)
Chloride: 107 mmol/L — ABNORMAL HIGH (ref 96–106)
Creatinine, Ser: 0.89 mg/dL (ref 0.57–1.00)
Globulin, Total: 2 g/dL (ref 1.5–4.5)
Glucose: 90 mg/dL (ref 70–99)
Potassium: 4.4 mmol/L (ref 3.5–5.2)
Sodium: 145 mmol/L — ABNORMAL HIGH (ref 134–144)
Total Protein: 6.7 g/dL (ref 6.0–8.5)
eGFR: 68 mL/min/{1.73_m2} (ref >59)

## 2021-07-18 LAB — LIPID PANEL
Chol/HDL Ratio: 2.7 ratio (ref 0.0–4.4)
Cholesterol, Total: 185 mg/dL (ref 100–199)
HDL: 69 mg/dL (ref >39)
LDL Chol Calc (NIH): 102 mg/dL — ABNORMAL HIGH (ref 0–99)
Triglycerides: 73 mg/dL (ref 0–149)
VLDL Cholesterol Cal: 14 mg/dL (ref 5–40)

## 2021-07-18 LAB — HEMOGLOBIN A1C
Est. average glucose Bld gHb Est-mCnc: 131 mg/dL
Hgb A1c MFr Bld: 6.2 % — ABNORMAL HIGH (ref 4.8–5.6)

## 2021-07-18 LAB — URIC ACID: Uric Acid: 7.2 mg/dL (ref 3.1–7.9)

## 2021-08-19 ENCOUNTER — Encounter: Payer: Self-pay | Admitting: Internal Medicine

## 2021-08-19 DIAGNOSIS — R310 Gross hematuria: Secondary | ICD-10-CM | POA: Diagnosis not present

## 2021-08-25 ENCOUNTER — Ambulatory Visit: Payer: Medicare Other

## 2021-08-28 ENCOUNTER — Ambulatory Visit: Payer: Medicare Other | Attending: Internal Medicine

## 2021-08-28 ENCOUNTER — Other Ambulatory Visit (HOSPITAL_BASED_OUTPATIENT_CLINIC_OR_DEPARTMENT_OTHER): Payer: Self-pay

## 2021-08-28 DIAGNOSIS — Z23 Encounter for immunization: Secondary | ICD-10-CM

## 2021-08-28 MED ORDER — PFIZER COVID-19 VAC BIVALENT 30 MCG/0.3ML IM SUSP
INTRAMUSCULAR | 0 refills | Status: DC
  Filled 2021-08-28: qty 0.3, 1d supply, fill #0

## 2021-08-28 NOTE — Progress Notes (Signed)
° °  Covid-19 Vaccination Clinic  Name:  Renee Bradley    MRN: [id number] DOB: 03-08-1946  08/28/2021  Renee Bradley was observed post Covid-19 immunization for 15 minutes without incident. She was provided with Vaccine Information Sheet and instruction to access the V-Safe system.   Renee Bradley was instructed to call 911 with any severe reactions post vaccine: Difficulty breathing  Swelling of face and throat  A fast heartbeat  A bad rash all over body  Dizziness and weakness   Immunizations Administered     Name Date Dose VIS Date Route   Pfizer Covid-19 Vaccine Bivalent Booster 08/28/2021  3:57 PM 0.3 mL 04/02/2021 Intramuscular   Manufacturer: [employer], [employer]   Lot: [id number]   NDC: [phone]

## 2021-09-27 ENCOUNTER — Other Ambulatory Visit: Payer: Self-pay | Admitting: Internal Medicine

## 2021-11-17 ENCOUNTER — Other Ambulatory Visit: Payer: Self-pay | Admitting: Internal Medicine

## 2021-11-20 ENCOUNTER — Ambulatory Visit: Payer: Medicare Other | Admitting: Internal Medicine

## 2021-12-02 ENCOUNTER — Ambulatory Visit (INDEPENDENT_AMBULATORY_CARE_PROVIDER_SITE_OTHER): Payer: Medicare Other | Admitting: Internal Medicine

## 2021-12-02 ENCOUNTER — Encounter: Payer: Self-pay | Admitting: Internal Medicine

## 2021-12-02 VITALS — BP 132/76 | HR 83 | Temp 98.6°F | Ht 61.2 in | Wt 198.6 lb

## 2021-12-02 DIAGNOSIS — K5900 Constipation, unspecified: Secondary | ICD-10-CM | POA: Diagnosis not present

## 2021-12-02 DIAGNOSIS — Z1231 Encounter for screening mammogram for malignant neoplasm of breast: Secondary | ICD-10-CM

## 2021-12-02 DIAGNOSIS — E66812 Obesity, class 2: Secondary | ICD-10-CM

## 2021-12-02 DIAGNOSIS — I129 Hypertensive chronic kidney disease with stage 1 through stage 4 chronic kidney disease, or unspecified chronic kidney disease: Secondary | ICD-10-CM | POA: Diagnosis not present

## 2021-12-02 DIAGNOSIS — Z6837 Body mass index (BMI) 37.0-37.9, adult: Secondary | ICD-10-CM

## 2021-12-02 DIAGNOSIS — N182 Chronic kidney disease, stage 2 (mild): Secondary | ICD-10-CM

## 2021-12-02 DIAGNOSIS — Z1211 Encounter for screening for malignant neoplasm of colon: Secondary | ICD-10-CM | POA: Diagnosis not present

## 2021-12-02 DIAGNOSIS — E2839 Other primary ovarian failure: Secondary | ICD-10-CM | POA: Diagnosis not present

## 2021-12-02 DIAGNOSIS — E1122 Type 2 diabetes mellitus with diabetic chronic kidney disease: Secondary | ICD-10-CM | POA: Diagnosis not present

## 2021-12-02 NOTE — Patient Instructions (Addendum)
Voltaren gel - apply to knees twice daily as needed ?Please get over the counter ? ?Diabetes Mellitus and Nutrition, Adult ?When you have diabetes, or diabetes mellitus, it is very important to have healthy eating habits because your blood sugar (glucose) levels are greatly affected by what you eat and drink. Eating healthy foods in the right amounts, at about the same times every day, can help you: ?Manage your blood glucose. ?Lower your risk of heart disease. ?Improve your blood pressure. ?Reach or maintain a healthy weight. ?What can affect my meal plan? ?Every person with diabetes is different, and each person has different needs for a meal plan. Your health care provider may recommend that you work with a dietitian to make a meal plan that is best for you. Your meal plan may vary depending on factors such as: ?The calories you need. ?The medicines you take. ?Your weight. ?Your blood glucose, blood pressure, and cholesterol levels. ?Your activity level. ?Other health conditions you have, such as heart or kidney disease. ?How do carbohydrates affect me? ?Carbohydrates, also called carbs, affect your blood glucose level more than any other type of food. Eating carbs raises the amount of glucose in your blood. ?It is important to know how many carbs you can safely have in each meal. This is different for every person. Your dietitian can help you calculate how many carbs you should have at each meal and for each snack. ?How does alcohol affect me? ?Alcohol can cause a decrease in blood glucose (hypoglycemia), especially if you use insulin or take certain diabetes medicines by mouth. Hypoglycemia can be a life-threatening condition. Symptoms of hypoglycemia, such as sleepiness, dizziness, and confusion, are similar to symptoms of having too much alcohol. ?Do not drink alcohol if: ?Your health care provider tells you not to drink. ?You are pregnant, may be pregnant, or are planning to become pregnant. ?If you drink  alcohol: ?Limit how much you have to: ?0-1 drink a day for women. ?0-2 drinks a day for men. ?Know how much alcohol is in your drink. In the [place] one drink equals one 12 oz bottle of beer (355 mL), one 5 oz glass of wine (148 mL), or one 1? oz glass of hard liquor (44 mL). ?Keep yourself hydrated with water, diet soda, or unsweetened iced tea. Keep in mind that regular soda, juice, and other mixers may contain a lot of sugar and must be counted as carbs. ?What are tips for following this plan? ? ?Reading food labels ?Start by checking the serving size on the Nutrition Facts label of packaged foods and drinks. The number of calories and the amount of carbs, fats, and other nutrients listed on the label are based on one serving of the item. Many items contain more than one serving per package. ?Check the total grams (g) of carbs in one serving. ?Check the number of grams of saturated fats and trans fats in one serving. Choose foods that have a low amount or none of these fats. ?Check the number of milligrams (mg) of salt (sodium) in one serving. Most people should limit total sodium intake to less than 2,300 mg per day. ?Always check the nutrition information of foods labeled as "low-fat" or "nonfat." These foods may be higher in added sugar or refined carbs and should be avoided. ?Talk to your dietitian to identify your daily goals for nutrients listed on the label. ?Shopping ?Avoid buying canned, pre-made, or processed foods. These foods tend to be high in fat, sodium,  and added sugar. ?Shop around the outside edge of the grocery store. This is where you will most often find fresh fruits and vegetables, bulk grains, fresh meats, and fresh dairy products. ?Cooking ?Use low-heat cooking methods, such as baking, instead of high-heat cooking methods, such as deep frying. ?Cook using healthy oils, such as olive, canola, or sunflower oil. ?Avoid cooking with butter, cream, or high-fat meats. ?Meal planning ?Eat meals and  snacks regularly, preferably at the same times every day. Avoid going long periods of time without eating. ?Eat foods that are high in fiber, such as fresh fruits, vegetables, beans, and whole grains. ?Eat 4-6 oz (112-168 g) of lean protein each day, such as lean meat, chicken, fish, eggs, or tofu. One ounce (oz) (28 g) of lean protein is equal to: ?1 oz (28 g) of meat, chicken, or fish. ?1 egg. ?? cup (62 g) of tofu. ?Eat some foods each day that contain healthy fats, such as avocado, nuts, seeds, and fish. ?What foods should I eat? ?Fruits ?Berries. Apples. Oranges. Peaches. Apricots. Plums. Grapes. Mangoes. Papayas. Pomegranates. Kiwi. Cherries. ?Vegetables ?Leafy greens, including lettuce, spinach, kale, chard, collard greens, mustard greens, and cabbage. Beets. Cauliflower. Broccoli. Carrots. Green beans. Tomatoes. Peppers. Onions. Cucumbers. Brussels sprouts. ?Grains ?Whole grains, such as whole-wheat or whole-grain bread, crackers, tortillas, cereal, and pasta. Unsweetened oatmeal. Quinoa. Brown or wild rice. ?Meats and other proteins ?Seafood. Poultry without skin. Lean cuts of poultry and beef. Tofu. Nuts. Seeds. ?Dairy ?Low-fat or fat-free dairy products such as milk, yogurt, and cheese. ?The items listed above may not be a complete list of foods and beverages you can eat and drink. Contact a dietitian for more information. ?What foods should I avoid? ?Fruits ?Fruits canned with syrup. ?Vegetables ?Canned vegetables. Frozen vegetables with butter or cream sauce. ?Grains ?Refined white flour and flour products such as bread, pasta, snack foods, and cereals. Avoid all processed foods. ?Meats and other proteins ?Fatty cuts of meat. Poultry with skin. Breaded or fried meats. Processed meat. Avoid saturated fats. ?Dairy ?Full-fat yogurt, cheese, or milk. ?Beverages ?Sweetened drinks, such as soda or iced tea. ?The items listed above may not be a complete list of foods and beverages you should avoid. Contact a  dietitian for more information. ?Questions to ask a health care provider ?Do I need to meet with a certified diabetes care and education specialist? ?Do I need to meet with a dietitian? ?What number can I call if I have questions? ?When are the best times to check my blood glucose? ?Where to find more information: ?American Diabetes Association: diabetes.org ?Academy of Nutrition and Dietetics: eatright.org ?[employer] of Diabetes and Digestive and Kidney Diseases: StageSync.si ?Association of Diabetes Care & Education Specialists: diabeteseducator.org ?Summary ?It is important to have healthy eating habits because your blood sugar (glucose) levels are greatly affected by what you eat and drink. It is important to use alcohol carefully. ?A healthy meal plan will help you manage your blood glucose and lower your risk of heart disease. ?Your health care provider may recommend that you work with a dietitian to make a meal plan that is best for you. ?This information is not intended to replace advice given to you by your health care provider. Make sure you discuss any questions you have with your health care provider. ?Document Revised: 02/21/2020 Document Reviewed: 02/21/2020 ?Elsevier Patient Education ? 2023 Elsevier Inc. ? ?

## 2021-12-02 NOTE — Progress Notes (Signed)
?[name],acting as a Education administrator for [name], MD.,have documented all relevant documentation on the behalf of [name], MD,as directed by  [name], MD while in the presence of [name], MD.  ?This visit occurred during the SARS-CoV-2 public health emergency.  Safety protocols were in place, including screening questions prior to the visit, additional usage of staff PPE, and extensive cleaning of exam room while observing appropriate contact time as indicated for disinfecting solutions. ? ?Subjective:  ?  ? Patient ID: Renee Bradley , female    DOB: 1945-10-15 , 76 y.o.   MRN: [id number] ? ? ?Chief Complaint  ?Patient presents with  ? Diabetes  ? Hypertension  ? ? ?HPI ? ?She presents today for diabetes and htn check. She reports compliance with meds. She denies headaches, chest pain and shortness of breath.  ? ?Diabetes ?She presents for her follow-up diabetic visit. She has type 2 diabetes mellitus. Her disease course has been stable. There are no hypoglycemic associated symptoms. There are no diabetic associated symptoms. Pertinent negatives for diabetes include no blurred vision, no chest pain, no polydipsia, no polyphagia and no polyuria. There are no hypoglycemic complications. Diabetic complications include nephropathy. Risk factors for coronary artery disease include diabetes mellitus, dyslipidemia, hypertension, post-menopausal, sedentary lifestyle and obesity. She is compliant with treatment some of the time. Her weight is fluctuating minimally. She is following a diabetic diet. She participates in exercise intermittently. An ACE inhibitor/angiotensin II receptor blocker is not being taken.  ?Hypertension ?This is a chronic problem. The current episode started more than 1 year ago. The problem has been gradually improving since onset. The problem is uncontrolled. Pertinent negatives include no blurred vision or chest pain. The current treatment provides moderate  improvement. Hypertensive end-organ damage includes kidney disease.   ? ?Past Medical History:  ?Diagnosis Date  ? Asthma   ? Chest pressure 07/13/2012  ? Diabetes mellitus without complication ([place])   ? DM (diabetes mellitus) ([place]) 07/13/2012  ? HTN (hypertension) 07/13/2012  ? Hypercholesterolemia   ? Hypertension   ? SOB (shortness of breath) 07/13/2012  ? Tachycardia, unspecified, "fluttering" 07/13/2012  ?  ? ?Family History  ?Problem Relation Age of Onset  ? Heart attack Mother   ? Diabetes type II Mother   ? Stroke Mother   ? Alzheimer's disease Father   ? Diabetes type II Brother   ? ? ? ?Current Outpatient Medications:  ?  allopurinol (ZYLOPRIM) 100 MG tablet, Take 1 tablet (100 mg total) by mouth daily., Disp: 30 tablet, Rfl: 1 ?  amLODipine (NORVASC) 5 MG tablet, Take 1 tablet (5 mg total) by mouth daily., Disp: 90 tablet, Rfl: 1 ?  aspirin EC 81 MG tablet, Take 81 mg by mouth daily., Disp: , Rfl:  ?  atenolol (TENORMIN) 50 MG tablet, TAKE 1 TABLET TWICE A DAY, Disp: 180 tablet, Rfl: 1 ?  ergocalciferol (VITAMIN D2) 50000 units capsule, Take 50,000 Units by mouth once a week., Disp: , Rfl:  ?  furosemide (LASIX) 20 MG tablet, TAKE 1 TABLET DAILY        *HIKMA/WEST-WARD MFR*, Disp: 90 tablet, Rfl: 0 ?  Lancets (ONETOUCH DELICA PLUS [id number]) MISC, , Disp: , Rfl:  ?  ONETOUCH VERIO test strip, , Disp: , Rfl:  ?  rosuvastatin (CRESTOR) 20 MG tablet, Take by mouth., Disp: , Rfl:  ?  COVID-19 mRNA bivalent vaccine, Pfizer, (PFIZER COVID-19 VAC BIVALENT) injection, Inject into the muscle. (Patient not taking:  Reported on 12/02/2021), Disp: 0.3 mL, Rfl: 0  ? ?Allergies  ?Allergen Reactions  ? Janumet [Sitagliptin-Metformin Hcl] Shortness Of Breath  ? Januvia [Sitagliptin] Shortness Of Breath  ? Kombiglyze [Saxagliptin-Metformin Er] Shortness Of Breath  ? Elemental Sulfur Itching  ?  ? ?Review of Systems  ?Constitutional: Negative.   ?Eyes:  Negative for blurred vision.  ?Respiratory: Negative.    ?Cardiovascular:  Negative.  Negative for chest pain.  ?Gastrointestinal:  Positive for constipation.  ?Endocrine: Negative for polydipsia, polyphagia and polyuria.  ?Neurological: Negative.   ?Psychiatric/Behavioral: Negative.     ? ?Today's Vitals  ? 12/02/21 1447  ?BP: 132/76  ?Pulse: 83  ?Temp: 98.6 ?F (37 ?C)  ?Weight: 198 lb 9.6 oz (90.1 kg)  ?Height: 5' 1.2" (1.554 m)  ? ?Body mass index is 37.28 kg/m?.  ?Wt Readings from Last 3 Encounters:  ?12/02/21 198 lb 9.6 oz (90.1 kg)  ?07/17/21 196 lb 6.4 oz (89.1 kg)  ?03/13/21 195 lb 3.2 oz (88.5 kg)  ?  ? ?Objective:  ?Physical Exam ?Vitals and nursing note reviewed.  ?Constitutional:   ?   Appearance: Normal appearance.  ?HENT:  ?   Head: Normocephalic and atraumatic.  ?Eyes:  ?   Extraocular Movements: Extraocular movements intact.  ?Cardiovascular:  ?   Rate and Rhythm: Normal rate and regular rhythm.  ?   Heart sounds: Normal heart sounds.  ?Pulmonary:  ?   Effort: Pulmonary effort is normal.  ?   Breath sounds: Normal breath sounds.  ?Musculoskeletal:  ?   Cervical back: Normal range of motion.  ?Skin: ?   General: Skin is warm.  ?Neurological:  ?   General: No focal deficit present.  ?   Mental Status: She is alert.  ?Psychiatric:     ?   Mood and Affect: Mood normal.     ?   Behavior: Behavior normal.  ?   ?Assessment And Plan:  ?   ?1. Type 2 diabetes mellitus with stage 2 chronic kidney disease, without long-term current use of insulin ([place]) ?Comments: Chronic, I will check labs today and forward to Dr. [name], her endocrinologist. She is encouraged to follow dietary recommendations.  ?- CMP14+EGFR ?- Hemoglobin A1c ? ?2. Hypertensive nephropathy ?Comments: Chronic, fair control. Goal BP<130/80. She will c/w current meds. She is encouraged to follow low sodium diet.  ? ?3. Constipation, unspecified constipation type ?Comments: She has not had a colonoscopy in over 10 years, agrees to GI referral. Encouraged to stay well hydrated and slowly increase fiber intake.  ?-  Ambulatory referral to Gastroenterology ? ?4. Estrogen deficiency ?Comments: She agrees to bone density. Importance of vitamin D/calcium supplementation was d/w patient. Also reminded to engage in weight-bearing exercises 3x/wk. ?- DG Bone Density; Future ? ?5. Class 2 severe obesity due to excess calories with serious comorbidity and body mass index (BMI) of 37.0 to 37.9 in adult [facility]) ?Comments: She is encouraged to incorporate more exercise into her daily routine. Adviesd to aim for at least 150 minutes per week.  ? ?6. Screening for colon cancer ?Comments: She is now ready to have colonoscopy, she has declined in the past. Last colonoscopy was with Dr. Drue Flirt greater than 10 years ago.  ?- Ambulatory referral to Gastroenterology ? ?7. Encounter for screening mammogram for malignant neoplasm of breast ?- MM DIGITAL SCREENING BILATERAL; Future ?  ?Patient was given opportunity to ask questions. Patient verbalized understanding of the plan and was able to repeat key elements of the plan. All  questions were answered to their satisfaction.  ? ?I, [name], MD, have reviewed all documentation for this visit. The documentation on 12/02/21 for the exam, diagnosis, procedures, and orders are all accurate and complete.  ? ?IF YOU HAVE BEEN REFERRED TO A SPECIALIST, IT MAY TAKE 1-2 WEEKS TO SCHEDULE/PROCESS THE REFERRAL. IF YOU HAVE NOT HEARD FROM US/SPECIALIST IN TWO WEEKS, PLEASE GIVE [place] A CALL AT [phone].  ? ?THE PATIENT IS ENCOURAGED TO PRACTICE SOCIAL DISTANCING DUE TO THE COVID-19 PANDEMIC.   ?

## 2021-12-16 ENCOUNTER — Ambulatory Visit: Payer: Medicare Other

## 2021-12-18 DIAGNOSIS — R1032 Left lower quadrant pain: Secondary | ICD-10-CM | POA: Diagnosis not present

## 2021-12-18 DIAGNOSIS — R140 Abdominal distension (gaseous): Secondary | ICD-10-CM | POA: Diagnosis not present

## 2021-12-18 DIAGNOSIS — E785 Hyperlipidemia, unspecified: Secondary | ICD-10-CM | POA: Diagnosis not present

## 2021-12-18 DIAGNOSIS — E669 Obesity, unspecified: Secondary | ICD-10-CM | POA: Diagnosis not present

## 2021-12-18 DIAGNOSIS — K5900 Constipation, unspecified: Secondary | ICD-10-CM | POA: Diagnosis not present

## 2021-12-18 DIAGNOSIS — E119 Type 2 diabetes mellitus without complications: Secondary | ICD-10-CM | POA: Diagnosis not present

## 2021-12-18 DIAGNOSIS — I10 Essential (primary) hypertension: Secondary | ICD-10-CM | POA: Diagnosis not present

## 2022-01-05 ENCOUNTER — Ambulatory Visit
Admission: RE | Admit: 2022-01-05 | Discharge: 2022-01-05 | Disposition: A | Discharge status: Home / Self Care | Payer: Medicare Other | Source: Ambulatory Visit | Attending: Internal Medicine | Admitting: Internal Medicine

## 2022-01-05 DIAGNOSIS — Z1231 Encounter for screening mammogram for malignant neoplasm of breast: Secondary | ICD-10-CM

## 2022-01-09 DIAGNOSIS — I10 Essential (primary) hypertension: Secondary | ICD-10-CM | POA: Diagnosis not present

## 2022-01-09 DIAGNOSIS — E1165 Type 2 diabetes mellitus with hyperglycemia: Secondary | ICD-10-CM | POA: Diagnosis not present

## 2022-01-09 DIAGNOSIS — E7800 Pure hypercholesterolemia, unspecified: Secondary | ICD-10-CM | POA: Diagnosis not present

## 2022-01-12 DIAGNOSIS — Z1211 Encounter for screening for malignant neoplasm of colon: Secondary | ICD-10-CM | POA: Diagnosis not present

## 2022-01-12 DIAGNOSIS — Z1212 Encounter for screening for malignant neoplasm of rectum: Secondary | ICD-10-CM | POA: Diagnosis not present

## 2022-01-12 LAB — COLOGUARD: Cologuard: NEGATIVE

## 2022-01-15 ENCOUNTER — Other Ambulatory Visit: Payer: Self-pay | Admitting: Internal Medicine

## 2022-01-22 ENCOUNTER — Encounter: Payer: Self-pay | Admitting: Internal Medicine

## 2022-03-24 DIAGNOSIS — H35033 Hypertensive retinopathy, bilateral: Secondary | ICD-10-CM | POA: Diagnosis not present

## 2022-03-24 LAB — HM DIABETES EYE EXAM

## 2022-04-23 ENCOUNTER — Ambulatory Visit (INDEPENDENT_AMBULATORY_CARE_PROVIDER_SITE_OTHER): Payer: Medicare Other | Admitting: Internal Medicine

## 2022-04-23 ENCOUNTER — Ambulatory Visit (INDEPENDENT_AMBULATORY_CARE_PROVIDER_SITE_OTHER): Payer: Medicare Other

## 2022-04-23 ENCOUNTER — Telehealth: Payer: Self-pay | Admitting: *Deleted

## 2022-04-23 ENCOUNTER — Encounter: Payer: Self-pay | Admitting: Internal Medicine

## 2022-04-23 VITALS — BP 118/80 | HR 70 | Temp 98.4°F | Ht 61.4 in | Wt 196.0 lb

## 2022-04-23 DIAGNOSIS — N182 Chronic kidney disease, stage 2 (mild): Secondary | ICD-10-CM

## 2022-04-23 DIAGNOSIS — E1122 Type 2 diabetes mellitus with diabetic chronic kidney disease: Secondary | ICD-10-CM | POA: Diagnosis not present

## 2022-04-23 DIAGNOSIS — E7800 Pure hypercholesterolemia, unspecified: Secondary | ICD-10-CM

## 2022-04-23 DIAGNOSIS — Z0000 Encounter for general adult medical examination without abnormal findings: Secondary | ICD-10-CM | POA: Diagnosis not present

## 2022-04-23 DIAGNOSIS — Z6836 Body mass index (BMI) 36.0-36.9, adult: Secondary | ICD-10-CM

## 2022-04-23 DIAGNOSIS — Z79899 Other long term (current) drug therapy: Secondary | ICD-10-CM | POA: Diagnosis not present

## 2022-04-23 DIAGNOSIS — I129 Hypertensive chronic kidney disease with stage 1 through stage 4 chronic kidney disease, or unspecified chronic kidney disease: Secondary | ICD-10-CM

## 2022-04-23 LAB — POCT URINALYSIS DIPSTICK
Bilirubin, UA: NEGATIVE
Glucose, UA: NEGATIVE
Ketones, UA: NEGATIVE
Leukocytes, UA: NEGATIVE
Nitrite, UA: NEGATIVE
Odor: 3
Protein, UA: NEGATIVE
Spec Grav, UA: 1.015 (ref 1.010–1.025)
Urobilinogen, UA: 0.2 E.U./dL
pH, UA: 5.5 (ref 5.0–8.0)

## 2022-04-23 NOTE — Patient Instructions (Signed)

## 2022-04-23 NOTE — Progress Notes (Signed)
[name],acting as a Education administrator for [name], MD.,have documented all relevant documentation on the behalf of [name], MD,as directed by  [name], MD while in the presence of [name], MD.    Subjective:     Patient ID: Renee Bradley , female    DOB: [birth year] , 76 y.o.   MRN: [id number]   Chief Complaint  Patient presents with   Diabetes   Hypertension    HPI  She presents today for diabetes and htn check. She reports compliance with meds. She denies headaches, chest pain and shortness of breath.   She is also scheduled for AWV with [facility] Advisor today.   Diabetes She presents for her follow-up diabetic visit. She has type 2 diabetes mellitus. Her disease course has been stable. There are no hypoglycemic associated symptoms. There are no diabetic associated symptoms. Pertinent negatives for diabetes include no blurred vision, no chest pain, no polydipsia, no polyphagia and no polyuria. There are no hypoglycemic complications. Diabetic complications include nephropathy. Risk factors for coronary artery disease include diabetes mellitus, dyslipidemia, hypertension, post-menopausal, sedentary lifestyle and obesity. She is compliant with treatment some of the time. Her weight is fluctuating minimally. She is following a diabetic diet. She participates in exercise intermittently. An ACE inhibitor/angiotensin II receptor blocker is not being taken.  Hypertension This is a chronic problem. The current episode started more than 1 year ago. The problem has been gradually improving since onset. The problem is uncontrolled. Pertinent negatives include no blurred vision or chest pain. The current treatment provides moderate improvement. Hypertensive end-organ damage includes kidney disease.     Past Medical History:  Diagnosis Date   Asthma    Chest pressure 07/13/2012   Diabetes mellitus without complication (HCC)    DM (diabetes mellitus) ([place]) 07/13/2012    HTN (hypertension) 07/13/2012   Hypercholesterolemia    Hypertension    SOB (shortness of breath) 07/13/2012   Tachycardia, unspecified, "fluttering" 07/13/2012     Family History  Problem Relation Age of Onset   Heart attack Mother    Diabetes type II Mother    Stroke Mother    Alzheimer's disease Father    Diabetes type II Brother      Current Outpatient Medications:    allopurinol (ZYLOPRIM) 100 MG tablet, Take 1 tablet (100 mg total) by mouth daily., Disp: 30 tablet, Rfl: 1   amLODipine (NORVASC) 5 MG tablet, Take 1 tablet (5 mg total) by mouth daily., Disp: 90 tablet, Rfl: 1   aspirin EC 81 MG tablet, Take 81 mg by mouth daily., Disp: , Rfl:    atenolol (TENORMIN) 50 MG tablet, TAKE 1 TABLET TWICE A DAY, Disp: 180 tablet, Rfl: 1   COVID-19 mRNA bivalent vaccine, Pfizer, (PFIZER COVID-19 VAC BIVALENT) injection, Inject into the muscle. (Patient not taking: Reported on 12/02/2021), Disp: 0.3 mL, Rfl: 0   ergocalciferol (VITAMIN D2) 50000 units capsule, Take 50,000 Units by mouth once a week., Disp: , Rfl:    furosemide (LASIX) 20 MG tablet, TAKE 1 TABLET DAILY        *HIKMA/WEST-WARD MFR*, Disp: 90 tablet, Rfl: 0   Lancets (ONETOUCH DELICA PLUS [id number]) MISC, , Disp: , Rfl:    ONETOUCH VERIO test strip, , Disp: , Rfl:    rosuvastatin (CRESTOR) 20 MG tablet, Take by mouth., Disp: , Rfl:    Allergies  Allergen Reactions   Janumet [Sitagliptin-Metformin Hcl] Shortness Of Breath   Januvia [Sitagliptin] Shortness Of Breath  Kombiglyze [Saxagliptin-Metformin Er] Shortness Of Breath   Elemental Sulfur Itching     Review of Systems  Constitutional: Negative.   Eyes: Negative.  Negative for blurred vision.  Respiratory: Negative.    Cardiovascular: Negative.  Negative for chest pain.  Gastrointestinal: Negative.   Endocrine: Negative for polydipsia, polyphagia and polyuria.  Musculoskeletal: Negative.   Skin: Negative.   Neurological: Negative.   Psychiatric/Behavioral:  Negative.       Today's Vitals   04/23/22 1026  BP: 118/80  Pulse: 70  Temp: 98.4 F (36.9 C)  Weight: 195 lb 15.8 oz (88.9 kg)  Height: 5' 1.4" (1.56 m)   Body mass index is 36.55 kg/m.  Wt Readings from Last 3 Encounters:  04/23/22 195 lb 15.8 oz (88.9 kg)  04/23/22 196 lb (88.9 kg)  12/02/21 198 lb 9.6 oz (90.1 kg)     Objective:  Physical Exam Vitals and nursing note reviewed.  Constitutional:      Appearance: Normal appearance. She is obese.  HENT:     Head: Normocephalic and atraumatic.  Eyes:     Extraocular Movements: Extraocular movements intact.  Cardiovascular:     Rate and Rhythm: Normal rate and regular rhythm.     Heart sounds: Normal heart sounds.  Pulmonary:     Effort: Pulmonary effort is normal.     Breath sounds: Normal breath sounds.  Skin:    General: Skin is warm.  Neurological:     General: No focal deficit present.     Mental Status: She is alert.  Psychiatric:        Mood and Affect: Mood normal.        Behavior: Behavior normal.      Assessment And Plan:     1. Type 2 diabetes mellitus with stage 2 chronic kidney disease, without long-term current use of insulin (HCC) Comments: Chronic, I will check labs as below. She is enocuraged to follow dietary recommendations. She is not on meds per her request.  - AMB Referral to [facility] Coordinaton - CMP14+EGFR - CBC - Lipid panel - Hemoglobin A1c  2. Hypertensive nephropathy Comments: Chronic, well controlled. She will c/w amlodipine 1m and furosemide 236mdaily.  - [facility] [place] [facility] Lipid [facility]  3. Pure hypercholesterolemia Comments: Chronic, LDL goal <70. She will c/w rosuvastatin 2066maily for now.  - Lipid panel - TSH  4. Class 2 severe obesity due to excess calories with serious comorbidity and body mass index (BMI) of 36.0 to 36.9 in adult ([facility]: She is encouraged to incorporate more activity into her daily routine. Advised to do  chair exercises during commercials when watching TV. - AMB Referral to [facility]. Drug therapy - Vitamin B12   Patient was given opportunity to ask questions. Patient verbalized understanding of the plan and was able to repeat key elements of the plan. All questions were answered to their satisfaction.   I, RobMaximino GreenlandD, have reviewed all documentation for this visit. The documentation on 04/23/22 for the exam, diagnosis, procedures, and orders are all accurate and complete.   IF YOU HAVE BEEN REFERRED TO A SPECIALIST, IT MAY TAKE 1-2 WEEKS TO SCHEDULE/PROCESS THE REFERRAL. IF YOU HAVE NOT HEARD FROM US/SPECIALIST IN TWO WEEKS, PLEASE GIVE US [place]CALL AT [phone] X [phone].   THE PATIENT IS ENCOURAGED TO PRACTICE SOCIAL DISTANCING DUE TO THE COVID-19 PANDEMIC.

## 2022-04-23 NOTE — Chronic Care Management (AMB) (Signed)
  Care Coordination   Note   04/23/2022 Name: [name] MRN: [id number] DOB: 1945/08/12  Renee Bradley is a 76 y.o. year old female who sees Glendale Chard, MD for primary care. I reached out to [employer] by phone today to offer care coordination services.  Renee Bradley was given information about Care Coordination services today including:   The Care Coordination services include support from the care team which includes your Nurse Coordinator, Clinical Social Worker, or Pharmacist.  The Care Coordination team is here to help remove barriers to the health concerns and goals most important to you. Care Coordination services are voluntary, and the patient may decline or stop services at any time by request to their care team member.   Care Coordination Consent Status: Patient agreed to services and verbal consent obtained.   Follow up plan:  Telephone appointment with care coordination team member scheduled for:  05/11/22  Encounter Outcome:  Pt. Scheduled  [place]  Direct Dial: [phone]

## 2022-04-23 NOTE — Progress Notes (Signed)
Subjective:   Renee Bradley is a 76 y.o. female who presents for Medicare Annual (Subsequent) preventive examination.  Review of Systems     Cardiac Risk Factors include: advanced age (>5men, >43 women);diabetes mellitus;dyslipidemia;hypertension;obesity (BMI >30kg/m2)     Objective:    Today's Vitals   04/23/22 1000  BP: 118/80  Pulse: 70  Temp: 98.4 F (36.9 C)  TempSrc: Oral  SpO2: 99%  Weight: 196 lb (88.9 kg)  Height: 5' 1.4" (1.56 m)   Body mass index is 36.55 kg/m.     04/23/2022   10:17 AM 03/13/2021    3:56 PM 02/07/2020    2:29 PM 02/02/2019    3:10 PM 03/25/2015    3:36 PM 03/03/2015    4:48 PM 10/30/2014    4:22 AM  Advanced Directives  Does Patient Have a Medical Advance Directive? No No No No Yes No No  Type of Advance Directive     [place] in Chart?     Yes    Would patient like information on creating a medical advance directive? No - Patient declined No - Patient declined No - Patient declined Yes (MAU/Ambulatory/Procedural Areas - Information given)   No - patient declined information    Current Medications (verified) Outpatient Encounter Medications as of 04/23/2022  Medication Sig   allopurinol (ZYLOPRIM) 100 MG tablet Take 1 tablet (100 mg total) by mouth daily.   amLODipine (NORVASC) 5 MG tablet Take 1 tablet (5 mg total) by mouth daily.   aspirin EC 81 MG tablet Take 81 mg by mouth daily.   atenolol (TENORMIN) 50 MG tablet TAKE 1 TABLET TWICE A DAY   ergocalciferol (VITAMIN D2) 50000 units capsule Take 50,000 Units by mouth once a week.   furosemide (LASIX) 20 MG tablet TAKE 1 TABLET DAILY        *HIKMA/WEST-WARD MFR*   Lancets (ONETOUCH DELICA PLUS [id number]) MISC    ONETOUCH VERIO test strip    rosuvastatin (CRESTOR) 20 MG tablet Take by mouth.   COVID-19 mRNA bivalent vaccine, Pfizer, (PFIZER COVID-19 VAC BIVALENT) injection Inject into the muscle. (Patient not taking: Reported on  12/02/2021)   No facility-administered encounter medications on file as of 04/23/2022.    Allergies (verified) Janumet [sitagliptin-metformin hcl], Januvia [sitagliptin], Kombiglyze [saxagliptin-metformin er], and Elemental sulfur   History: Past Medical History:  Diagnosis Date   Asthma    Chest pressure 07/13/2012   Diabetes mellitus without complication ([place])    DM (diabetes mellitus) ([place]) 07/13/2012   HTN (hypertension) 07/13/2012   Hypercholesterolemia    Hypertension    SOB (shortness of breath) 07/13/2012   Tachycardia, unspecified, "fluttering" 07/13/2012   Past Surgical History:  Procedure Laterality Date   ABDOMINAL HYSTERECTOMY     Family History  Problem Relation Age of Onset   Heart attack Mother    Diabetes type II Mother    Stroke Mother    Alzheimer's disease Father    Diabetes type II Brother    Social History   Socioeconomic History   Marital status: Married    Spouse name: Not on file   Number of children: Not on file   Years of education: Not on file   Highest education level: Not on file  Occupational History   Occupation: retired  Tobacco Use   Smoking status: Former    Years: 2.00    Types: Cigarettes    Quit date: 05/13/1983  Years since quitting: 38.9   Smokeless tobacco: Never  Vaping Use   Vaping Use: Never used  Substance and Sexual Activity   Alcohol use: No   Drug use: No   Sexual activity: Yes  Other Topics Concern   Not on file  Social History Narrative   Not on file   Social Determinants of Health   Financial Resource Strain: Low Risk  (04/23/2022)   Overall Financial Resource Strain (CARDIA)    Difficulty of Paying Living Expenses: Not hard at all  Food Insecurity: No Food Insecurity (04/23/2022)   Hunger Vital Sign    Worried About Running Out of Food in the Last Year: Never true    Ran Out of Food in the Last Year: Never true  Transportation Needs: No Transportation Needs (04/23/2022)   PRAPARE - Armed forces logistics/support/administrative officer (Medical): No    Lack of Transportation (Non-Medical): No  Physical Activity: Inactive (04/23/2022)   Exercise Vital Sign    Days of Exercise per Week: 0 days    Minutes of Exercise per Session: 0 min  Stress: No Stress Concern Present (04/23/2022)   [place]    Feeling of Stress : Not at all  Social Connections: Not on file    Tobacco Counseling Counseling given: Not Answered   Clinical Intake:  Pre-visit preparation completed: Yes  Pain : No/denies pain     Nutritional Status: BMI > 30  Obese Nutritional Risks: None Diabetes: Yes  How often do you need to have someone help you when you read instructions, pamphlets, or other written materials from your doctor or pharmacy?: 1 - Never What is the last grade level you completed in school?: some college  Diabetic? Yes Nutrition Risk Assessment:  Has the patient had any N/V/D within the last 2 months?  No  Does the patient have any non-healing wounds?  No  Has the patient had any unintentional weight loss or weight gain?  No   Diabetes:  Is the patient diabetic?  Yes  If diabetic, was a CBG obtained today?  No  Did the patient bring in their glucometer from home?  No  How often do you monitor your CBG's? Four times weekly.   Financial Strains and Diabetes Management:  Are you having any financial strains with the device, your supplies or your medication? No .  Does the patient want to be seen by Chronic Care Management for management of their diabetes?  No  Would the patient like to be referred to a Nutritionist or for Diabetic Management?  No   Diabetic Exams:  Diabetic Eye Exam: Completed 03/2022 Diabetic Foot Exam: Overdue, Pt has been advised about the importance in completing this exam. Pt is scheduled for diabetic foot exam on next appointment.   Interpreter Needed?: No  Information entered by :: NAllen  LPN   Activities of Daily Living    04/23/2022   10:18 AM  In your present state of health, do you have any difficulty performing the following activities:  Hearing? 0  Vision? 0  Difficulty concentrating or making decisions? 0  Walking or climbing stairs? 1  Comment due to knee  Dressing or bathing? 0  Doing errands, shopping? 0  Preparing Food and eating ? N  Using the Toilet? N  In the past six months, have you accidently leaked urine? Y  Do you have problems with loss of bowel control? N  Managing your Medications?  N  Managing your Finances? N  Housekeeping or managing your Housekeeping? N    Patient Care Team: Glendale Chard, MD as PCP - [facility], [place] any recent Medical Services you may have received from other than [facility] providers in the past year (date may be approximate).     Assessment:   This is a routine wellness examination for Renee Bradley.  Hearing/Vision screen Vision Screening - Comments:: Regular eye exams, Dr. Ebony Hail  Dietary issues and exercise activities discussed: Current Exercise Habits: The patient does not participate in regular exercise at present   Goals Addressed             This Visit's Progress    Patient Stated       04/23/2022, wants to find a part time job       Depression Screen    04/23/2022   10:18 AM 03/13/2021    3:57 PM 02/07/2020    2:32 PM 06/19/2019    3:08 PM 02/02/2019    3:12 PM 08/17/2018    3:09 PM 07/13/2018    2:15 PM  PHQ 2/9 Scores  PHQ - 2 Score 0 0 0 0 0 0 0  PHQ- 9 Score     0      Fall Risk    04/23/2022   10:18 AM 03/13/2021    3:56 PM 02/07/2020    2:31 PM 06/19/2019    3:07 PM 02/02/2019    3:12 PM  [place] in the past year? 0 0 0 0 0  Number falls in past yr: 0      Injury with Fall? 0      Risk for fall due to : Medication side effect Medication side effect Medication side effect  Medication side effect  Follow up Falls prevention  discussed;Education provided;Falls evaluation completed Falls evaluation completed;Education provided;Falls prevention discussed Falls evaluation completed;Education provided;Falls prevention discussed  Falls evaluation completed;Education provided;Falls prevention discussed    FALL RISK PREVENTION PERTAINING TO THE HOME:  Any stairs in or around the home? Yes  If so, are there any without handrails? No  Home free of loose throw rugs in walkways, pet beds, electrical cords, etc? Yes  Adequate lighting in your home to reduce risk of falls? Yes   ASSISTIVE DEVICES UTILIZED TO PREVENT FALLS:  Life alert? No  Use of a cane, walker or w/c? No  Grab bars in the bathroom? Yes  Shower chair or bench in shower? No  Elevated toilet seat or a handicapped toilet? Yes   TIMED UP AND GO:  Was the test performed? Yes .  Length of time to ambulate 10 feet: 5 sec.   Gait slow and steady without use of assistive device  Cognitive Function:        04/23/2022   10:19 AM 03/13/2021    3:58 PM 02/07/2020    2:34 PM 02/02/2019    3:17 PM  6CIT Screen  What Year? 0 points 0 points 0 points 0 points  What month? 0 points 0 points 0 points 0 points  What time? 0 points 0 points 0 points 0 points  Count back from 20 0 points 0 points 0 points 0 points  Months in reverse 0 points 0 points 0 points 0 points  Repeat phrase 4 points 4 points 4 points 0 points  Total Score 4 points 4 points 4 points 0 points    Immunizations Immunization History  Administered Date(s) Administered  DTaP 07/11/2013   Fluad Quad(high Dose 65+) 05/31/2020, 07/17/2021   Influenza, High Dose Seasonal PF 06/14/2018, 05/16/2019   PFIZER(Purple Top)SARS-COV-2 Vaccination 10/13/2019, 11/03/2019   Pfizer Covid-19 Vaccine Bivalent Booster 37yrs & up 08/28/2021    TDAP status: Up to date  Flu Vaccine status: Declined, Education has been provided regarding the importance of this vaccine but patient still declined. Advised may  receive this vaccine at local pharmacy or Health Dept. Aware to provide a copy of the vaccination record if obtained from local pharmacy or Health Dept. Verbalized acceptance and understanding.  Pneumococcal vaccine status: Declined,  Education has been provided regarding the importance of this vaccine but patient still declined. Advised may receive this vaccine at local pharmacy or Health Dept. Aware to provide a copy of the vaccination record if obtained from local pharmacy or Health Dept. Verbalized acceptance and understanding.   Covid-19 vaccine status: Completed vaccines  Qualifies for Shingles Vaccine? Yes   Zostavax completed No   Shingrix Completed?: No.    Education has been provided regarding the importance of this vaccine. Patient has been advised to call insurance company to determine out of pocket expense if they have not yet received this vaccine. Advised may also receive vaccine at local pharmacy or Health Dept. Verbalized acceptance and understanding.  Screening Tests Health Maintenance  Topic Date Due   OPHTHALMOLOGY EXAM  Never done   FOOT EXAM  02/02/2020   COVID-19 Vaccine (4 - Pfizer series) 12/26/2021   HEMOGLOBIN A1C  01/15/2022   Diabetic kidney evaluation - Urine ACR  03/13/2022   Pneumonia Vaccine 71+ Years old (1 - PCV) 07/17/2022 (Originally 02/10/2012)   Zoster Vaccines- Shingrix (1 of 2) 07/23/2022 (Originally 02/09/1997)   INFLUENZA VACCINE  11/01/2022 (Originally 03/03/2022)   Diabetic kidney evaluation - GFR measurement  07/17/2022   TETANUS/TDAP  07/12/2023   Fecal DNA (Cologuard)  01/12/2025   DEXA SCAN  Completed   Hepatitis C Screening  Completed   HPV VACCINES  Aged Out    Health Maintenance  Health Maintenance Due  Topic Date Due   OPHTHALMOLOGY EXAM  Never done   FOOT EXAM  02/02/2020   COVID-19 Vaccine (4 - Pfizer series) 12/26/2021   HEMOGLOBIN A1C  01/15/2022   Diabetic kidney evaluation - Urine ACR  03/13/2022    Colorectal cancer  screening: Type of screening: Cologuard. Completed 01/12/2022. Repeat every 3 years  Mammogram status: Completed 01/05/2022. Repeat every year  Bone Density status: scheduled for 05/22/2022  Lung Cancer Screening: (Low Dose CT Chest recommended if Age 68-80 years, 30 pack-year currently smoking OR have quit w/in 15years.) does not qualify.   Lung Cancer Screening Referral: no  Additional Screening:  Hepatitis C Screening: does qualify; Completed .02/07/2020  Vision Screening: Recommended annual ophthalmology exams for early detection of glaucoma and other disorders of the eye. Is the patient up to date with their annual eye exam?  Yes  Who is the provider or what is the name of the office in which the patient attends annual eye exams? Dr. Lahoma Rocker If pt is not established with a provider, would they like to be referred to a provider to establish care? No .   Dental Screening: Recommended annual dental exams for proper oral hygiene  Community Resource Referral / Chronic Care Management: CRR required this visit?  No   CCM required this visit?  No      Plan:     I have personally reviewed and noted the following in the patient's  chart:   Medical and social history Use of alcohol, tobacco or illicit drugs  Current medications and supplements including opioid prescriptions. Patient is not currently taking opioid prescriptions. Functional ability and status Nutritional status Physical activity Advanced directives List of other physicians Hospitalizations, surgeries, and ER visits in previous 12 months Vitals Screenings to include cognitive, depression, and falls Referrals and appointments  In addition, I have reviewed and discussed with patient certain preventive protocols, quality metrics, and best practice recommendations. A written personalized care plan for preventive services as well as general preventive health recommendations were provided to patient.     Kellie Simmering,  LPN   [id number]   Nurse Notes: none

## 2022-04-23 NOTE — Patient Instructions (Signed)
[name] , Thank you for taking time to come for your Medicare Wellness Visit. I appreciate your ongoing commitment to your health goals. Please review the following plan we discussed and let me know if I can assist you in the future.   Screening recommendations/referrals: Colonoscopy: cologuard 01/12/2022, due 01/12/2025 Mammogram: completed 01/05/2022, due 01/07/2023 Bone Density: scheduled for 05/22/2022 Recommended yearly ophthalmology/optometry visit for glaucoma screening and checkup Recommended yearly dental visit for hygiene and checkup  Vaccinations: Influenza vaccine: decline Pneumococcal vaccine: decline Tdap vaccine: completed 07/11/2013, due 07/12/2023 Shingles vaccine: decline   Covid-19: 08/28/2021, 11/03/2019, 10/13/2019  Advanced directives: Advance directive discussed with you today. Even though you declined this today please call our office should you change your mind and we can give you the proper paperwork for you to fill out.  Conditions/risks identified: none  Next appointment: Follow up in one year for your annual wellness visit    Preventive Care 65 Years and Older, Female Preventive care refers to lifestyle choices and visits with your health care provider that can promote health and wellness. What does preventive care include? A yearly physical exam. This is also called an annual well check. Dental exams once or twice a year. Routine eye exams. Ask your health care provider how often you should have your eyes checked. Personal lifestyle choices, including: Daily care of your teeth and gums. Regular physical activity. Eating a healthy diet. Avoiding tobacco and drug use. Limiting alcohol use. Practicing safe sex. Taking low-dose aspirin every day. Taking vitamin and mineral supplements as recommended by your health care provider. What happens during an annual well check? The services and screenings done by your health care provider during your annual well  check will depend on your age, overall health, lifestyle risk factors, and family history of disease. Counseling  Your health care provider may ask you questions about your: Alcohol use. Tobacco use. Drug use. Emotional well-being. Home and relationship well-being. Sexual activity. Eating habits. History of falls. Memory and ability to understand (cognition). Work and work Statistician. Reproductive health. Screening  You may have the following tests or measurements: Height, weight, and BMI. Blood pressure. Lipid and cholesterol levels. These may be checked every 5 years, or more frequently if you are over 59 years old. Skin check. Lung cancer screening. You may have this screening every year starting at age 2 if you have a 30-pack-year history of smoking and currently smoke or have quit within the past 15 years. Fecal occult blood test (FOBT) of the stool. You may have this test every year starting at age 19. Flexible sigmoidoscopy or colonoscopy. You may have a sigmoidoscopy every 5 years or a colonoscopy every 10 years starting at age 52. Hepatitis C blood test. Hepatitis B blood test. Sexually transmitted disease (STD) testing. Diabetes screening. This is done by checking your blood sugar (glucose) after you have not eaten for a while (fasting). You may have this done every 1-3 years. Bone density scan. This is done to screen for osteoporosis. You may have this done starting at age 78. Mammogram. This may be done every 1-2 years. Talk to your health care provider about how often you should have regular mammograms. Talk with your health care provider about your test results, treatment options, and if necessary, the need for more tests. Vaccines  Your health care provider may recommend certain vaccines, such as: Influenza vaccine. This is recommended every year. Tetanus, diphtheria, and acellular pertussis (Tdap, Td) vaccine. You may need a Td booster  every 10 years. Zoster  vaccine. You may need this after age 67. Pneumococcal 13-valent conjugate (PCV13) vaccine. One dose is recommended after age 63. Pneumococcal polysaccharide (PPSV23) vaccine. One dose is recommended after age 58. Talk to your health care provider about which screenings and vaccines you need and how often you need them. This information is not intended to replace advice given to you by your health care provider. Make sure you discuss any questions you have with your health care provider. Document Released: 08/16/2015 Document Revised: 04/08/2016 Document Reviewed: 05/21/2015 Elsevier Interactive Patient Education  2017 [place] Prevention in the Home Falls can cause injuries. They can happen to people of all ages. There are many things you can do to make your home safe and to help prevent falls. What can I do on the outside of my home? Regularly fix the edges of walkways and driveways and fix any cracks. Remove anything that might make you trip as you walk through a door, such as a raised step or threshold. Trim any bushes or trees on the path to your home. Use bright outdoor lighting. Clear any walking paths of anything that might make someone trip, such as rocks or tools. Regularly check to see if handrails are loose or broken. Make sure that both sides of any steps have handrails. Any raised decks and porches should have guardrails on the edges. Have any leaves, snow, or ice cleared regularly. Use sand or salt on walking paths during winter. Clean up any spills in your garage right away. This includes oil or grease spills. What can I do in the bathroom? Use night lights. Install grab bars by the toilet and in the tub and shower. Do not use towel bars as grab bars. Use non-skid mats or decals in the tub or shower. If you need to sit down in the shower, use a plastic, non-slip stool. Keep the floor dry. Clean up any water that spills on the floor as soon as it happens. Remove  soap buildup in the tub or shower regularly. Attach bath mats securely with double-sided non-slip rug tape. Do not have throw rugs and other things on the floor that can make you trip. What can I do in the bedroom? Use night lights. Make sure that you have a light by your bed that is easy to reach. Do not use any sheets or blankets that are too big for your bed. They should not hang down onto the floor. Have a firm chair that has side arms. You can use this for support while you get dressed. Do not have throw rugs and other things on the floor that can make you trip. What can I do in the kitchen? Clean up any spills right away. Avoid walking on wet floors. Keep items that you use a lot in easy-to-reach places. If you need to reach something above you, use a strong step stool that has a grab bar. Keep electrical cords out of the way. Do not use floor polish or wax that makes floors slippery. If you must use wax, use non-skid floor wax. Do not have throw rugs and other things on the floor that can make you trip. What can I do with my stairs? Do not leave any items on the stairs. Make sure that there are handrails on both sides of the stairs and use them. Fix handrails that are broken or loose. Make sure that handrails are as long as the stairways. Check any carpeting to  make sure that it is firmly attached to the stairs. Fix any carpet that is loose or worn. Avoid having throw rugs at the top or bottom of the stairs. If you do have throw rugs, attach them to the floor with carpet tape. Make sure that you have a light switch at the top of the stairs and the bottom of the stairs. If you do not have them, ask someone to add them for you. What else can I do to help prevent falls? Wear shoes that: Do not have high heels. Have rubber bottoms. Are comfortable and fit you well. Are closed at the toe. Do not wear sandals. If you use a stepladder: Make sure that it is fully opened. Do not climb a  closed stepladder. Make sure that both sides of the stepladder are locked into place. Ask someone to hold it for you, if possible. Clearly mark and make sure that you can see: Any grab bars or handrails. First and last steps. Where the edge of each step is. Use tools that help you move around (mobility aids) if they are needed. These include: Canes. Walkers. Scooters. Crutches. Turn on the lights when you go into a dark area. Replace any light bulbs as soon as they burn out. Set up your furniture so you have a clear path. Avoid moving your furniture around. If any of your floors are uneven, fix them. If there are any pets around you, be aware of where they are. Review your medicines with your doctor. Some medicines can make you feel dizzy. This can increase your chance of falling. Ask your doctor what other things that you can do to help prevent falls. This information is not intended to replace advice given to you by your health care provider. Make sure you discuss any questions you have with your health care provider. Document Released: 05/16/2009 Document Revised: 12/26/2015 Document Reviewed: 08/24/2014 Elsevier Interactive Patient Education  2017 [employer].

## 2022-04-24 LAB — MICROALBUMIN / CREATININE URINE RATIO
Creatinine, Urine: 122.8 mg/dL
Microalb/Creat Ratio: 23 mg/g creat (ref 0–29)
Microalbumin, Urine: 28.5 ug/mL

## 2022-04-24 LAB — CBC
Hematocrit: 42.2 % (ref 34.0–46.6)
Hemoglobin: 13.8 g/dL (ref 11.1–15.9)
MCH: 28.8 pg (ref 26.6–33.0)
MCHC: 32.7 g/dL (ref 31.5–35.7)
MCV: 88 fL (ref 79–97)
Platelets: 206 10*3/uL (ref 150–450)
RBC: 4.8 x10E6/uL (ref 3.77–5.28)
RDW: 14.8 % (ref 11.7–15.4)
WBC: 4.3 10*3/uL (ref 3.4–10.8)

## 2022-04-24 LAB — CMP14+EGFR
ALT: 10 IU/L (ref 0–32)
AST: 13 IU/L (ref 0–40)
Albumin/Globulin Ratio: 2.5 — ABNORMAL HIGH (ref 1.2–2.2)
Albumin: 4.5 g/dL (ref 3.8–4.8)
Alkaline Phosphatase: 75 IU/L (ref 44–121)
BUN/Creatinine Ratio: 15 (ref 12–28)
BUN: 14 mg/dL (ref 8–27)
Bilirubin Total: 1.4 mg/dL — ABNORMAL HIGH (ref 0.0–1.2)
CO2: 22 mmol/L (ref 20–29)
Calcium: 9.3 mg/dL (ref 8.7–10.3)
Chloride: 109 mmol/L — ABNORMAL HIGH (ref 96–106)
Creatinine, Ser: 0.95 mg/dL (ref 0.57–1.00)
Globulin, Total: 1.8 g/dL (ref 1.5–4.5)
Glucose: 124 mg/dL — ABNORMAL HIGH (ref 70–99)
Potassium: 4.1 mmol/L (ref 3.5–5.2)
Sodium: 145 mmol/L — ABNORMAL HIGH (ref 134–144)
Total Protein: 6.3 g/dL (ref 6.0–8.5)
eGFR: 62 mL/min/{1.73_m2} (ref >59)

## 2022-04-24 LAB — VITAMIN B12: Vitamin B-12: 382 pg/mL (ref 232–1245)

## 2022-04-24 LAB — HEMOGLOBIN A1C
Est. average glucose Bld gHb Est-mCnc: 140 mg/dL
Hgb A1c MFr Bld: 6.5 % — ABNORMAL HIGH (ref 4.8–5.6)

## 2022-04-24 LAB — LIPID PANEL
Chol/HDL Ratio: 2.7 ratio (ref 0.0–4.4)
Cholesterol, Total: 159 mg/dL (ref 100–199)
HDL: 58 mg/dL (ref >39)
LDL Chol Calc (NIH): 88 mg/dL (ref 0–99)
Triglycerides: 68 mg/dL (ref 0–149)
VLDL Cholesterol Cal: 13 mg/dL (ref 5–40)

## 2022-04-24 LAB — TSH: TSH: 1.38 u[IU]/mL (ref 0.450–4.500)

## 2022-04-25 ENCOUNTER — Other Ambulatory Visit: Payer: Self-pay | Admitting: Internal Medicine

## 2022-05-02 ENCOUNTER — Encounter: Payer: Self-pay | Admitting: Internal Medicine

## 2022-05-11 ENCOUNTER — Ambulatory Visit: Payer: Self-pay

## 2022-05-22 ENCOUNTER — Other Ambulatory Visit: Payer: Medicare Other

## 2022-06-01 ENCOUNTER — Other Ambulatory Visit: Payer: Self-pay | Admitting: Internal Medicine

## 2022-06-08 NOTE — Patient Outreach (Signed)
  Care Coordination   Follow Up Visit Note   06/08/2022 Name: Renee Bradley MRN: [id number] DOB: Jan 13, 1946  Renee Bradley is a 76 y.o. year old female who sees Glendale Chard, MD for primary care. I spoke with  Renee Bradley by phone today.  What matters to the patients health and wellness today?  Patient would like to work on lowering her LDL.     Goals Addressed               This Visit's Progress     Patient Stated     I am concerned about my double vision and drooping eyelids (pt-stated)        Care Coordination Interventions: Evaluation of current treatment plan related to changes in vision and drooping eye lids and patient's adherence to plan as established by provider Determined patient discontinued her statin over the past month due to experiencing intermittent change in vision with eyelid drooping Discussed PCP recommendations to continue taking her statin as directed and contact her doctor to report symptoms of impaired vision  Determined patient will follow PCP recommendations          I want to work on lowering my LDL (pt-stated)        Care Coordination Interventions: Provider established cholesterol goals reviewed Counseled on importance of regular laboratory monitoring as prescribed Provided HLD educational materials Reviewed role and benefits of statin for ASCVD risk reduction Reviewed importance of limiting foods high in cholesterol Reviewed exercise goals and target of 150 minutes per week Educated patient related to increasing her daily fiber intake Sent referral to Pharm D regarding patient's request to consider alternative therapies to help manage her LDL          SDOH assessments and interventions completed:  No     Care Coordination Interventions Activated:  Yes  Care Coordination Interventions:  Yes, provided   Follow up plan: Referral made to [facility] to discuss alternative therapy for management of  Hyperlipidemia Follow up call scheduled for 07/06/22    Encounter Outcome:  Pt. Visit Completed

## 2022-06-08 NOTE — Patient Instructions (Signed)
Visit Information  Thank you for taking time to visit with me today. Please don't hesitate to contact me if I can be of assistance to you.   Following are the goals we discussed today:   Goals Addressed               This Visit's Progress     Patient Stated     I am concerned about my double vision and drooping eyelids (pt-stated)        Care Coordination Interventions: Evaluation of current treatment plan related to changes in vision and drooping eye lids and patient's adherence to plan as established by provider Determined patient discontinued her statin over the past month due to experiencing intermittent change in vision with eyelid drooping Discussed PCP recommendations to continue taking her statin as directed and contact her doctor to report symptoms of impaired vision  Determined patient will follow PCP recommendations          I want to work on lowering my LDL (pt-stated)        Care Coordination Interventions: Provider established cholesterol goals reviewed Counseled on importance of regular laboratory monitoring as prescribed Provided HLD educational materials Reviewed role and benefits of statin for ASCVD risk reduction Reviewed importance of limiting foods high in cholesterol Reviewed exercise goals and target of 150 minutes per week Educated patient related to increasing her daily fiber intake Sent referral to Pharm D regarding patient's request to consider alternative therapies to help manage her LDL          Our next appointment is by telephone on 07/06/22 at 2:00 PM  Please call the care guide team at [phone] if you need to cancel or reschedule your appointment.   If you are experiencing a Mental Health or [place] or need someone to talk to, please call 1-800-273-TALK (toll free, 24 hour hotline)  The patient verbalized understanding of instructions, educational materials, and care plan provided today and agreed to receive a mailed  copy of patient instructions, educational materials, and care plan.   Barb Merino, RN, BSN, CCM Care Management Coordinator [facility] Care Management Direct Phone: [phone]

## 2022-07-06 ENCOUNTER — Ambulatory Visit: Payer: Self-pay

## 2022-07-06 NOTE — Patient Outreach (Signed)
  Care Coordination   07/06/2022 Name: Renee Bradley MRN: [id number] DOB: 1946/03/30   Care Coordination Outreach Attempts:  An unsuccessful telephone outreach was attempted for a scheduled appointment today.  Follow Up Plan:  Additional outreach attempts will be made to offer the patient care coordination information and services.   Encounter Outcome:  No Answer   Care Coordination Interventions:  No, not indicated    Delsa Sale, RN, BSN, CCM Care Management Coordinator [facility]  Direct Phone: [phone]

## 2022-07-20 ENCOUNTER — Other Ambulatory Visit: Payer: Self-pay

## 2022-07-20 MED ORDER — ROSUVASTATIN CALCIUM 20 MG PO TABS: 20.0000 mg | ORAL_TABLET | Freq: Every day | ORAL | 0 refills | Status: DC

## 2022-07-26 DIAGNOSIS — I499 Cardiac arrhythmia, unspecified: Secondary | ICD-10-CM | POA: Diagnosis not present

## 2022-07-27 ENCOUNTER — Encounter (HOSPITAL_COMMUNITY): Payer: Self-pay | Admitting: Emergency Medicine

## 2022-07-27 ENCOUNTER — Inpatient Hospital Stay (HOSPITAL_COMMUNITY)
Admission: EM | Admit: 2022-07-27 | Discharge: 2022-08-02 | DRG: 308 | Disposition: A | Discharge status: Home / Self Care | Payer: Medicare Other | Attending: Cardiovascular Disease | Admitting: Cardiovascular Disease

## 2022-07-27 ENCOUNTER — Emergency Department (HOSPITAL_COMMUNITY): Payer: Medicare Other

## 2022-07-27 ENCOUNTER — Observation Stay (HOSPITAL_BASED_OUTPATIENT_CLINIC_OR_DEPARTMENT_OTHER): Payer: Medicare Other

## 2022-07-27 ENCOUNTER — Other Ambulatory Visit: Payer: Self-pay

## 2022-07-27 DIAGNOSIS — Z8249 Family history of ischemic heart disease and other diseases of the circulatory system: Secondary | ICD-10-CM | POA: Diagnosis not present

## 2022-07-27 DIAGNOSIS — Z7982 Long term (current) use of aspirin: Secondary | ICD-10-CM

## 2022-07-27 DIAGNOSIS — I5021 Acute systolic (congestive) heart failure: Secondary | ICD-10-CM | POA: Diagnosis not present

## 2022-07-27 DIAGNOSIS — I5043 Acute on chronic combined systolic (congestive) and diastolic (congestive) heart failure: Secondary | ICD-10-CM | POA: Diagnosis not present

## 2022-07-27 DIAGNOSIS — Z713 Dietary counseling and surveillance: Secondary | ICD-10-CM

## 2022-07-27 DIAGNOSIS — Z888 Allergy status to other drugs, medicaments and biological substances status: Secondary | ICD-10-CM | POA: Diagnosis not present

## 2022-07-27 DIAGNOSIS — E1165 Type 2 diabetes mellitus with hyperglycemia: Secondary | ICD-10-CM | POA: Diagnosis present

## 2022-07-27 DIAGNOSIS — I110 Hypertensive heart disease with heart failure: Secondary | ICD-10-CM | POA: Diagnosis present

## 2022-07-27 DIAGNOSIS — Z90710 Acquired absence of both cervix and uterus: Secondary | ICD-10-CM

## 2022-07-27 DIAGNOSIS — I4891 Unspecified atrial fibrillation: Principal | ICD-10-CM | POA: Diagnosis present

## 2022-07-27 DIAGNOSIS — R279 Unspecified lack of coordination: Secondary | ICD-10-CM | POA: Diagnosis present

## 2022-07-27 DIAGNOSIS — I639 Cerebral infarction, unspecified: Principal | ICD-10-CM | POA: Insufficient documentation

## 2022-07-27 DIAGNOSIS — I361 Nonrheumatic tricuspid (valve) insufficiency: Secondary | ICD-10-CM | POA: Diagnosis not present

## 2022-07-27 DIAGNOSIS — K5730 Diverticulosis of large intestine without perforation or abscess without bleeding: Secondary | ICD-10-CM | POA: Diagnosis present

## 2022-07-27 DIAGNOSIS — N179 Acute kidney failure, unspecified: Secondary | ICD-10-CM | POA: Diagnosis present

## 2022-07-27 DIAGNOSIS — I428 Other cardiomyopathies: Secondary | ICD-10-CM | POA: Diagnosis present

## 2022-07-27 DIAGNOSIS — Z6834 Body mass index (BMI) 34.0-34.9, adult: Secondary | ICD-10-CM | POA: Diagnosis not present

## 2022-07-27 DIAGNOSIS — I611 Nontraumatic intracerebral hemorrhage in hemisphere, cortical: Secondary | ICD-10-CM | POA: Diagnosis present

## 2022-07-27 DIAGNOSIS — I517 Cardiomegaly: Secondary | ICD-10-CM | POA: Diagnosis not present

## 2022-07-27 DIAGNOSIS — R000 Tachycardia, unspecified: Secondary | ICD-10-CM | POA: Diagnosis not present

## 2022-07-27 DIAGNOSIS — M1612 Unilateral primary osteoarthritis, left hip: Secondary | ICD-10-CM | POA: Diagnosis not present

## 2022-07-27 DIAGNOSIS — E669 Obesity, unspecified: Secondary | ICD-10-CM | POA: Diagnosis present

## 2022-07-27 DIAGNOSIS — R29702 NIHSS score 2: Secondary | ICD-10-CM | POA: Diagnosis present

## 2022-07-27 DIAGNOSIS — Z79899 Other long term (current) drug therapy: Secondary | ICD-10-CM | POA: Diagnosis not present

## 2022-07-27 DIAGNOSIS — I63411 Cerebral infarction due to embolism of right middle cerebral artery: Secondary | ICD-10-CM | POA: Diagnosis not present

## 2022-07-27 DIAGNOSIS — I700 Atherosclerosis of aorta: Secondary | ICD-10-CM | POA: Diagnosis not present

## 2022-07-27 DIAGNOSIS — K8020 Calculus of gallbladder without cholecystitis without obstruction: Secondary | ICD-10-CM | POA: Diagnosis not present

## 2022-07-27 DIAGNOSIS — Z882 Allergy status to sulfonamides status: Secondary | ICD-10-CM

## 2022-07-27 DIAGNOSIS — I5042 Chronic combined systolic (congestive) and diastolic (congestive) heart failure: Secondary | ICD-10-CM | POA: Diagnosis present

## 2022-07-27 DIAGNOSIS — E7800 Pure hypercholesterolemia, unspecified: Secondary | ICD-10-CM | POA: Diagnosis present

## 2022-07-27 DIAGNOSIS — I5020 Unspecified systolic (congestive) heart failure: Secondary | ICD-10-CM | POA: Insufficient documentation

## 2022-07-27 DIAGNOSIS — E785 Hyperlipidemia, unspecified: Secondary | ICD-10-CM | POA: Diagnosis present

## 2022-07-27 DIAGNOSIS — I63511 Cerebral infarction due to unspecified occlusion or stenosis of right middle cerebral artery: Secondary | ICD-10-CM | POA: Diagnosis not present

## 2022-07-27 DIAGNOSIS — Z823 Family history of stroke: Secondary | ICD-10-CM

## 2022-07-27 DIAGNOSIS — N390 Urinary tract infection, site not specified: Secondary | ICD-10-CM | POA: Diagnosis not present

## 2022-07-27 DIAGNOSIS — Z87891 Personal history of nicotine dependence: Secondary | ICD-10-CM

## 2022-07-27 DIAGNOSIS — Z8673 Personal history of transient ischemic attack (TIA), and cerebral infarction without residual deficits: Secondary | ICD-10-CM

## 2022-07-27 LAB — CBC WITH DIFFERENTIAL/PLATELET
Abs Immature Granulocytes: 0 10*3/uL (ref 0.00–0.07)
Basophils Absolute: 0 10*3/uL (ref 0.0–0.1)
Basophils Relative: 1 %
Eosinophils Absolute: 0.1 10*3/uL (ref 0.0–0.5)
Eosinophils Relative: 1 %
HCT: 41.6 % (ref 36.0–46.0)
Hemoglobin: 13.7 g/dL (ref 12.0–15.0)
Immature Granulocytes: 0 %
Lymphocytes Relative: 22 %
Lymphs Abs: 1.1 10*3/uL (ref 0.7–4.0)
MCH: 29.3 pg (ref 26.0–34.0)
MCHC: 32.9 g/dL (ref 30.0–36.0)
MCV: 88.9 fL (ref 80.0–100.0)
Monocytes Absolute: 0.3 10*3/uL (ref 0.1–1.0)
Monocytes Relative: 5 %
Neutro Abs: 3.5 10*3/uL (ref 1.7–7.7)
Neutrophils Relative %: 71 %
Platelets: 187 10*3/uL (ref 150–400)
RBC: 4.68 MIL/uL (ref 3.87–5.11)
RDW: 14.7 % (ref 11.5–15.5)
WBC: 4.9 10*3/uL (ref 4.0–10.5)
nRBC: 0 % (ref 0.0–0.2)

## 2022-07-27 LAB — ECHOCARDIOGRAM COMPLETE
Height: 62 in
S' Lateral: 4.2 cm
Weight: 3088 oz

## 2022-07-27 LAB — BASIC METABOLIC PANEL
Anion gap: 10 (ref 5–15)
BUN: 20 mg/dL (ref 8–23)
CO2: 21 mmol/L — ABNORMAL LOW (ref 22–32)
Calcium: 9 mg/dL (ref 8.9–10.3)
Chloride: 112 mmol/L — ABNORMAL HIGH (ref 98–111)
Creatinine, Ser: 1.32 mg/dL — ABNORMAL HIGH (ref 0.44–1.00)
GFR, Estimated: 42 mL/min — ABNORMAL LOW (ref >60)
Glucose, Bld: 164 mg/dL — ABNORMAL HIGH (ref 70–99)
Potassium: 4.6 mmol/L (ref 3.5–5.1)
Sodium: 143 mmol/L (ref 135–145)

## 2022-07-27 LAB — HEPARIN LEVEL (UNFRACTIONATED)
Heparin Unfractionated: 0.88 IU/mL — ABNORMAL HIGH (ref 0.30–0.70)
Heparin Unfractionated: 0.97 IU/mL — ABNORMAL HIGH (ref 0.30–0.70)

## 2022-07-27 LAB — BRAIN NATRIURETIC PEPTIDE: B Natriuretic Peptide: 562.3 pg/mL — ABNORMAL HIGH (ref 0.0–100.0)

## 2022-07-27 LAB — TROPONIN I (HIGH SENSITIVITY)
Troponin I (High Sensitivity): 6 ng/L (ref <18)
Troponin I (High Sensitivity): 7 ng/L (ref <18)

## 2022-07-27 MED ORDER — FUROSEMIDE 20 MG PO TABS: 20.0000 mg | ORAL_TABLET | Freq: Every day | ORAL | Status: DC

## 2022-07-27 MED ORDER — IOHEXOL 350 MG/ML SOLN
100.0000 mL | Freq: Once | INTRAVENOUS | Status: AC | PRN
  Administered 2022-07-27: 100 mL via INTRAVENOUS

## 2022-07-27 MED ORDER — ACETAMINOPHEN 325 MG PO TABS: 650.0000 mg | ORAL_TABLET | ORAL | Status: DC | PRN

## 2022-07-27 MED ORDER — FUROSEMIDE 10 MG/ML IJ SOLN
40.0000 mg | Freq: Once | INTRAMUSCULAR | Status: DC
  Filled 2022-07-27: qty 4

## 2022-07-27 MED ORDER — HEPARIN (PORCINE) 25000 UT/250ML-% IV SOLN
800.0000 [IU]/h | INTRAVENOUS | Status: DC
  Administered 2022-07-27: 1100 [IU]/h via INTRAVENOUS
  Administered 2022-07-28 – 2022-07-29 (×2): 800 [IU]/h via INTRAVENOUS
  Filled 2022-07-27 (×3): qty 250

## 2022-07-27 MED ORDER — ONDANSETRON HCL 4 MG/2ML IJ SOLN: 4.0000 mg | Freq: Four times a day (QID) | INTRAMUSCULAR | Status: DC | PRN

## 2022-07-27 MED ORDER — METOPROLOL TARTRATE 25 MG PO TABS
25.0000 mg | ORAL_TABLET | Freq: Two times a day (BID) | ORAL | Status: DC
  Administered 2022-07-27 – 2022-07-29 (×4): 25 mg via ORAL
  Filled 2022-07-27 (×5): qty 1

## 2022-07-27 MED ORDER — METOPROLOL TARTRATE 25 MG PO TABS
12.5000 mg | ORAL_TABLET | Freq: Four times a day (QID) | ORAL | Status: DC
  Administered 2022-07-27: 12.5 mg via ORAL
  Filled 2022-07-27: qty 1

## 2022-07-27 MED ORDER — AMLODIPINE BESYLATE 5 MG PO TABS
5.0000 mg | ORAL_TABLET | Freq: Every day | ORAL | Status: DC
  Administered 2022-07-27 – 2022-07-28 (×2): 5 mg via ORAL
  Filled 2022-07-27 (×3): qty 1

## 2022-07-27 MED ORDER — HEPARIN BOLUS VIA INFUSION
3000.0000 [IU] | Freq: Once | INTRAVENOUS | Status: AC
  Administered 2022-07-27: 3000 [IU] via INTRAVENOUS
  Filled 2022-07-27: qty 3000

## 2022-07-27 MED ORDER — ROSUVASTATIN CALCIUM 20 MG PO TABS
20.0000 mg | ORAL_TABLET | Freq: Every day | ORAL | Status: DC
  Administered 2022-07-27: 20 mg via ORAL
  Filled 2022-07-27 (×2): qty 1

## 2022-07-27 NOTE — Progress Notes (Signed)
  Echocardiogram 2D Echocardiogram has been performed.  Delcie Roch 07/27/2022, 9:44 AM

## 2022-07-27 NOTE — H&P (Addendum)
Cardiology Admission History and Physical   Patient ID: Renee Bradley MRN: [id number]; DOB: [birth year]   Admission date: 07/27/2022  PCP:  Dorothyann Peng, MD   [facility] Providers Cardiologist: Dr. Nanetta Batty        Chief Complaint:  left arm strange sensation  Patient Profile:   Renee Bradley is a 76 y.o. female with pmhx diet-controlled diabetes (Hgb A1c 6.5), obesity and HTN who presented to the ER with a "strange sensation" in her left arm, and was found to be in new onset atrial fibrillation.  History of Present Illness:   Renee Bradley is a 76 y.o. female with pmhx diet-controlled diabetes (Hgb A1c 6.5), obesity and HTN who presented to the ER with a "strange sensation" in her left arm, and was found to be in new onset atrial fibrillation.  She was sitting at home and getting ready for bed when she noticed a numbness/tingling down her left arm. She called EMS. On arrival, patient reportedly had a HR to 190s and was given IV cardizem x1 with significant improvement of HR to 120s. On arrival to the ER, she was still in atrial fibrillation with heart rates in the 100s. She no longer had the feeling of numbness in her L arm. She was hemodynamically stable on arrival with BP 135/97, saturating 100% on RA. EKG showed atrial fibrillation without ST/T changes concerning for ischemia. CBC and CMP were wnl except for AKI to Cr 1.32 (baseline 0.95). She denies chest pain, presyncope, syncope, SOB, PND or orthopnea.  Prior to this event patient has never had atrial fibrillation, cardiac disease, stent or cardiac surgery. She had a TTE in 2014 with normal biventricular function and grade 1 DD. NM perfusion scan in 2014 that was performed due to her cardiac risk factors (family history of CAD, HTN, HLD, obesity, NIDDM) and SOB. NM stress showed decreased uptake inferolaterally towards the apex. Cardiac event monitor in 2014 showed sinus bradycardia, no atrial fibrillation.  She has seen Dr. Allyson Sabal in clinic in 2020 as an outpatient after hospitalization for SOB. At that time, medication for HTN and HLD were continued. Her PCP most recently did a TSH/T4 panel 04/23/2022 that was normal.  ON my examination in the ER, patient is resting comfortably in bed. She is still in atrial fibrillation and is unsure when the rhythm started. She is asymptomatic. BP is stable. She no longer has numbness in her L arm. CT angio was done on arrival due to concern for widened mediastinum on CXR. CTA shows mild cardiomegaly w/ biatrial dilation, no aortic dissection, and no pleural effusion. CBC and CMP were wnl except for AKI (Cr 1.3 up from b/l 0.95). Troponins were negative. BNP was 500. She will be admitted to observation for workup of new onset atrial fibrillation.    Past Medical History:  Diagnosis Date   Asthma    Chest pressure 07/13/2012   Diabetes mellitus without complication (HCC)    DM (diabetes mellitus) (HCC) 07/13/2012   HTN (hypertension) 07/13/2012   Hypercholesterolemia    Hypertension    SOB (shortness of breath) 07/13/2012   Tachycardia, unspecified, "fluttering" 07/13/2012    Past Surgical History:  Procedure Laterality Date   ABDOMINAL HYSTERECTOMY       Medications Prior to Admission: Prior to Admission medications   Medication Sig Start Date End Date Taking? Authorizing Provider  amLODipine (NORVASC) 5 MG tablet Take 1 tablet (5 mg total) by mouth daily. 10/07/20  Yes Dorothyann Peng,  MD  aspirin EC 81 MG tablet Take 81 mg by mouth daily.   Yes [provider]  atenolol (TENORMIN) 50 MG tablet TAKE 1 TABLET TWICE A DAY Patient taking differently: Take 50 mg by mouth 2 (two) times daily. 06/01/22  Yes Glendale Chard, MD  ergocalciferol (VITAMIN D2) 50000 units capsule Take 50,000 Units by mouth once a week.   Yes [provider]  furosemide (LASIX) 20 MG tablet TAKE 1 TABLET DAILY        *HIKMA/WEST-WARD MFR* Patient taking differently:  Take 20 mg by mouth daily. 04/27/22  Yes Glendale Chard, MD  rosuvastatin (CRESTOR) 20 MG tablet Take 1 tablet (20 mg total) by mouth daily. Patient taking differently: Take 20 mg by mouth See admin instructions. Take 1 tablet by mouth every two days 07/20/22  Yes Glendale Chard, MD  COVID-19 mRNA bivalent vaccine, Pfizer, (PFIZER COVID-19 VAC BIVALENT) injection Inject into the muscle. Patient not taking: Reported on 12/02/2021 08/28/21   Carlyle Basques, MD  Lancets (ONETOUCH DELICA PLUS [id number]) Everetts  05/12/18   [provider]  Roma Schanz test strip  05/12/18   [provider]     Allergies:    Allergies  Allergen Reactions   Janumet [Sitagliptin-Metformin Hcl] Shortness Of Breath   Januvia [Sitagliptin] Shortness Of Breath   Kombiglyze [Saxagliptin-Metformin Er] Shortness Of Breath   Elemental Sulfur Itching    Social History:   Social History   Socioeconomic History   Marital status: Married    Spouse name: Not on file   Number of children: Not on file   Years of education: Not on file   Highest education level: Not on file  Occupational History   Occupation: retired  Tobacco Use   Smoking status: Former    Years: 2.00    Types: Cigarettes    Quit date: 05/13/1983    Years since quitting: 39.2   Smokeless tobacco: Never  Vaping Use   Vaping Use: Never used  Substance and Sexual Activity   Alcohol use: No   Drug use: No   Sexual activity: Yes  Other Topics Concern   Not on file  Social History Narrative   Not on file   Social Determinants of Health   Financial Resource Strain: Low Risk  (04/23/2022)   Overall Financial Resource Strain (CARDIA)    Difficulty of Paying Living Expenses: Not hard at all  Food Insecurity: No Food Insecurity (04/23/2022)   Hunger Vital Sign    Worried About Running Out of Food in the Last Year: Never true    Ran Out of Food in the Last Year: Never true  Transportation Needs: No Transportation Needs (04/23/2022)    PRAPARE - Hydrologist (Medical): No    Lack of Transportation (Non-Medical): No  Physical Activity: Inactive (04/23/2022)   Exercise Vital Sign    Days of Exercise per Week: 0 days    Minutes of Exercise per Session: 0 min  Stress: No Stress Concern Present (04/23/2022)   [place]    Feeling of Stress : Not at all  Social Connections: Not on file  Intimate Partner Violence: Not At Risk (02/02/2019)   Humiliation, Afraid, Rape, and Kick questionnaire    Fear of Current or Ex-Partner: No    Emotionally Abused: No    Physically Abused: No    Sexually Abused: No    Family History:   The patient's family history includes  Alzheimer's disease in her father; Diabetes type II in her brother and mother; Heart attack in her mother; Stroke in her mother.    ROS:  Please see the history of present illness.  All other ROS reviewed and negative.     Physical Exam/Data:   Vitals:   07/27/22 0400 07/27/22 0415 07/27/22 0430 07/27/22 0445  BP: 129/77 131/84 130/80 (!) 135/97  Pulse: 85 90 80 92  Resp: 15 15 16 14   Temp:      TempSrc:      SpO2: 97% 97% 98% 100%  Weight:      Height:       No intake or output data in the 24 hours ending 07/27/22 0519    07/27/2022   12:28 AM 04/23/2022   10:26 AM 04/23/2022   10:00 AM  Last 3 Weights  Weight (lbs) 195 lb 195 lb 15.8 oz 196 lb  Weight (kg) 88.451 kg 88.9 kg 88.905 kg     Body mass index is 34.54 kg/m.  General:  Well nourished, well developed, in no acute distress HEENT: normal Neck: no JVD Vascular: No carotid bruits; Distal pulses 2+ bilaterally   Cardiac:  irregularly irregular rhythm, no murmur  Lungs:  clear to auscultation bilaterally, no wheezing, rhonchi or rales  Abd: soft, nontender, no hepatomegaly  Ext: 2+ pitting edema Musculoskeletal:  No deformities, BUE and BLE strength normal and equal Skin: warm and dry  Neuro:  CNs  2-12 intact, no focal abnormalities noted Psych:  Normal affect    EKG:  The ECG that was done today was personally reviewed and demonstrates atrial fibrillation  Relevant CV Studies: CT Angiogram 07/27/22 IMPRESSION: 1. No acute intrathoracic, abdominal, or pelvic pathology. No aortic dissection or aneurysm. 2. Cholelithiasis. 3. Colonic diverticulosis. No bowel obstruction. Normal appendix.  Cardiac event monitor 08/2012 Sinus bradycardia  NM stress test 2014 Raw Data Images:  Normal; no motion artifact; normal heart/lung ratio. Stress Images:  There is decreased uptake inferolaterally towards the apex Rest Images:  Normal homogeneous uptake in all areas of the myocardium. Subtraction (SDS):  These findings are consistent with ischemia.   Impression Exercise Capacity:  Lexiscan with no exercise. BP Response:  Normal blood pressure response. Clinical Symptoms:  No significant symptoms noted. ECG Impression:  No significant ST segment change suggestive of ischemia. Comparison with Prior Nuclear Study: No images to compare  TTE 08/10/2012 Left ventricle: The cavity size was normal. Wall thickness    was increased in a pattern of mild LVH. Systolic function    was normal. The estimated ejection fraction was in the    range of 55% to 60%. Wall motion was normal; there were no    regional wall motion abnormalities. Doppler parameters are    consistent with abnormal left ventricular relaxation    (grade 1 diastolic dysfunction).  - Mitral valve: Mildly thickened leaflets . Trivial    regurgitation. Valve area by pressure half-time: 2.32cm^2.  - Right ventricle: The cavity size was mildly dilated.    Systolic pressure was increased.  - Atrial septum: No defect or patent foramen ovale was    identified.  - Tricuspid valve: Mild regurgitation.  - Pulmonary arteries: PA peak pressure: 53mm Hg (S).  Transthoracic echocardiography.  M-mode, complete 2D,  spectral Doppler, and color  Doppler.  Height:  Height:  160cm. Height: 63in.  Weight:  Weight: 89.8kg. Weight:  197.6lb.  Body mass index:  BMI: 35.1kg/m^2.  Body surface  area:    BSA:  1.56m^2.  Blood pressure:     136/73.  Patient  status:  Outpatient.  Location:  Echo laboratory.   Laboratory Data:  High Sensitivity Troponin:   Recent Labs  Lab 07/27/22 0031 07/27/22 0231  TROPONINIHS 6 7      Chemistry Recent Labs  Lab 07/27/22 0031  NA 143  K 4.6  CL 112*  CO2 21*  GLUCOSE 164*  BUN 20  CREATININE 1.32*  CALCIUM 9.0  GFRNONAA 42*  ANIONGAP 10    No results for input(s): "PROT", "ALBUMIN", "AST", "ALT", "ALKPHOS", "BILITOT" in the last 168 hours. Lipids No results for input(s): "CHOL", "TRIG", "HDL", "LABVLDL", "LDLCALC", "CHOLHDL" in the last 168 hours. Hematology Recent Labs  Lab 07/27/22 0031  WBC 4.9  RBC 4.68  HGB 13.7  HCT 41.6  MCV 88.9  MCH 29.3  MCHC 32.9  RDW 14.7  PLT 187   Thyroid No results for input(s): "TSH", "FREET4" in the last 168 hours. BNP Recent Labs  Lab 07/27/22 0031  BNP 562.3*    DDimer No results for input(s): "DDIMER" in the last 168 hours.   Radiology/Studies:  CT ANGIO CHEST/ABD/PEL FOR DISSECTION W &/OR WO CONTRAST  Result Date: 07/27/2022 CLINICAL DATA:  Acute aortic syndrome suspected. EXAM: CT ANGIOGRAPHY CHEST, ABDOMEN AND PELVIS TECHNIQUE: Non-contrast CT of the chest was initially obtained. Multidetector CT imaging through the chest, abdomen and pelvis was performed using the standard protocol during bolus administration of intravenous contrast. Multiplanar reconstructed images and MIPs were obtained and reviewed to evaluate the vascular anatomy. RADIATION DOSE REDUCTION: This exam was performed according to the departmental dose-optimization program which includes automated exposure control, adjustment of the mA and/or kV according to patient size and/or use of iterative reconstruction technique. CONTRAST:  161mL OMNIPAQUE IOHEXOL 350 MG/ML  SOLN COMPARISON:  CT abdomen pelvis dated 06/18/2021. FINDINGS: CTA CHEST FINDINGS Cardiovascular: Mild cardiomegaly with biatrial dilatation. No pericardial effusion. The thoracic aorta is unremarkable. No pulmonary artery embolus identified. Mediastinum/Nodes: No hilar or mediastinal adenopathy. The esophagus is grossly unremarkable. No mediastinal fluid collection. Lungs/Pleura: No focal consolidation, pleural effusion, or pneumothorax. The central airways are patent. Musculoskeletal: Degenerative changes of the spine. No acute osseous pathology. Review of the MIP images confirms the above findings. CTA ABDOMEN AND PELVIS FINDINGS VASCULAR Aorta: Mild atherosclerotic calcification of the aorta. No aneurysmal dilatation or dissection. No periaortic fluid collection. Celiac: Patent without evidence of aneurysm, dissection, vasculitis or significant stenosis. SMA: Patent without evidence of aneurysm, dissection, vasculitis or significant stenosis. Renals: Both renal arteries are patent without evidence of aneurysm, dissection, vasculitis, fibromuscular dysplasia or significant stenosis. IMA: Patent without evidence of aneurysm, dissection, vasculitis or significant stenosis. Inflow: Mild atherosclerotic calcification of the iliac arteries. No aneurysmal dilatation or dissection. The iliac arteries are patent. Veins: No obvious venous abnormality within the limitations of this arterial phase study. Review of the MIP images confirms the above findings. NON-VASCULAR No intra-abdominal free air or free fluid. Hepatobiliary: The liver is unremarkable. No biliary dilatation. Several gallstones. No pericholecystic fluid or evidence of acute cholecystitis by CT. Pancreas: Unremarkable. No pancreatic ductal dilatation or surrounding inflammatory changes. Spleen: Normal in size without focal abnormality. Adrenals/Urinary Tract: The adrenal glands unremarkable the kidneys, visualized ureters, and urinary bladder appear  unremarkable. Stomach/Bowel: There is sigmoid diverticulosis and scattered colonic diverticula without active inflammatory changes. There is no bowel obstruction or active inflammation. The appendix is normal. Lymphatic: No adenopathy. Reproductive: Hysterectomy.  No adnexal masses. Other: None Musculoskeletal: Degenerative changes of the left  hip and spine. No acute osseous pathology. Review of the MIP images confirms the above findings. IMPRESSION: 1. No acute intrathoracic, abdominal, or pelvic pathology. No aortic dissection or aneurysm. 2. Cholelithiasis. 3. Colonic diverticulosis. No bowel obstruction. Normal appendix. Electronically Signed   By: Anner Crete M.D.   On: 07/27/2022 02:06   DG Chest Port 1 View  Result Date: 07/27/2022 CLINICAL DATA:  Tachycardia EXAM: PORTABLE CHEST 1 VIEW COMPARISON:  07/31/2016 FINDINGS: Increased size of the cardiomediastinal silhouette since 07/31/2016 is at least in part due to differences in technique and patient rotation however is somewhat greater than expected. No focal consolidation, pleural effusion, or pneumothorax. No acute osseous abnormality. IMPRESSION: Increased size of the cardiomediastinal silhouette since 07/31/2016 is at least in part due to differences in technique however is somewhat greater than expected. CTA of the chest is recommended if there is concern for pericardial effusion and/or acute aortic syndrome. These results were called by telephone at the time of interpretation on 07/27/2022 at 12:52 am to provider Quincy Carnes, PA, who verbally acknowledged these results. Electronically Signed   By: Placido Sou M.D.   On: 07/27/2022 00:52     Assessment and Plan:  Renee Bradley is a 76 y.o. female with pmhx diet-controlled diabetes (Hgb A1c 6.5), obesity and HTN who presented to the ER with new onset atrial fibrillation.  #. Atrial fibrillation, rate controlled She does not know the onset of this rhythm. Denies presyncope, syncope,  palpitations, orthopnea, PND, chest pain, SOB. Cardiac event monitor in 2014 showed sinus bradycardia. TTE in 2014 showed normal biventricular function. TSH/T4 in 04/2022 were wnl. She does not have signs or symptoms of infection. The etiology of her atrial fibrillation is unclear - but could be due to underlying structural heart disease given biatrial enlargement and cardiomegaly on CTA. CHADSVASC 13 (age, female, HTN) - Obtain TTE to rule out structural heart disease and valvular afib (2014 TTE showed thickened mitral leaflets) - Start IV heparin bolus + drip, convert to oral AC in AM - Start metoprolol 25 mg BID, consolidate prior to discharge (patient was on atenolol at home which was held on admission) - stopped her home aspirin in the setting of starting [facility] as aspirin was for primary prevention - Given that this is her first episode of atrial fibrillation, consider inpatient CV + TEE versus outpatient CV after 3 months of anticoagulation  #. HFpEF Patient has known grade I DD likely 2/2 hypertension. Today she presents with bilateral pitting edema, however no other signs of decompensated heart failure including no pleural effusions on CTA, no SOB, and no elevation in JVD. However in the setting of new cardiomegaly, bilateral pitting edema and new onset afib, will dose IV lasix to see if edema improves - Will IV lasix 40 x1 - Can transition to PO lasix 20 mg (home dose) once euvolemic - f/u TTE as above  #. HTN - continue home amlodipine - If patient has a decreased EF, she would benefit from switching to ACE/ARB/ARNI and SGLT2i given her history of diabetes - Continue home lasix 20 mg qD  #. HLD - Continue rosuvastatin 20 mg - stopped home asa as above given AC was started. Aspirin was for primary prevention  # AKI Cr 1.34 on admission (up from 0.95 at baseline) - Continue to monitor  Risk Assessment/Risk Scores:       CHA2DS2-VASc Score =   4  This indicates a 6.7% annual risk of  stroke. The patient's score  is based upon: sex, age, Hypertension       Severity of Illness: The appropriate patient status for this patient is OBSERVATION. Observation status is judged to be reasonable and necessary in order to provide the required intensity of service to ensure the patient's safety. The patient's presenting symptoms, physical exam findings, and initial radiographic and laboratory data in the context of their medical condition is felt to place them at decreased risk for further clinical deterioration. Furthermore, it is anticipated that the patient will be medically stable for discharge from the hospital within 2 midnights of admission.    For questions or updates, please contact [place] Please consult [email or web address] for contact info under     Signed, Loel Dubonnet, MD  07/27/2022 5:19 AM  [facility]  Agree with assessment and plan by Dr.Tannu.    Patient admitted with A-fib with RVR.  Other problems include diabetes, hypertension, obesity.  This is new onset without prior history of this.  She denies chest pain or shortness of breath.  Agree with IV heparin and conversion to DOAC/Eliquis.  Start beta-blocker for rate control.  Consider TEE guided DC cardioversion prior to discharge versus outpatient cardioversion after 4 weeks of oral anticoagulation.  Would obtain 2D echocardiogram.  Has had diastolic dysfunction in the past.   Lorretta Harp, M.D., [facility]ss[facility], [place], [address] [place], [place]  [address]  [phone] 07/27/2022 8:14 AM

## 2022-07-27 NOTE — Progress Notes (Signed)
ANTICOAGULATION CONSULT NOTE - Initial Consult  Pharmacy Consult for heparin Indication: atrial fibrillation  Allergies  Allergen Reactions   Janumet [Sitagliptin-Metformin Hcl] Shortness Of Breath   Januvia [Sitagliptin] Shortness Of Breath   Kombiglyze [Saxagliptin-Metformin Er] Shortness Of Breath   Elemental Sulfur Itching    Patient Measurements: Height: 5\' 3"  (160 cm) Weight: 88.5 kg (195 lb) IBW/kg (Calculated) : 52.4 Heparin Dosing Weight: 75kg  Vital Signs: Temp: 99 F (37.2 C) (12/25 0024) Temp Source: Oral (12/25 0024) BP: 131/84 (12/25 0415) Pulse Rate: 90 (12/25 0415)  Labs: Recent Labs    07/27/22 0031 07/27/22 0231  HGB 13.7  --   HCT 41.6  --   PLT 187  --   CREATININE 1.32*  --   TROPONINIHS 6 7    Estimated Creatinine Clearance: 38.8 mL/min (A) (by C-G formula based on SCr of 1.32 mg/dL (H)).   Medical History: Past Medical History:  Diagnosis Date   Asthma    Chest pressure 07/13/2012   Diabetes mellitus without complication (HCC)    DM (diabetes mellitus) (HCC) 07/13/2012   HTN (hypertension) 07/13/2012   Hypercholesterolemia    Hypertension    SOB (shortness of breath) 07/13/2012   Tachycardia, unspecified, "fluttering" 07/13/2012    Assessment: 76yo female c/o strange sensation in LUE, found by EMS to be in Af[employer] w/ RVR, given diltiazem bolus and now to start heparin.  Goal of Therapy:  Heparin level 0.3-0.7 units/ml Monitor platelets by anticoagulation protocol: Yes   Plan:  Heparin 3000 units IV bolus x1 followed by infusion at 1100 units/hr. Monitor heparin levels and CBC.  14/06/2012, PharmD, BCPS  07/27/2022,4:41 AM

## 2022-07-27 NOTE — ED Provider Notes (Signed)
[facility] EMERGENCY DEPARTMENT Provider Note   CSN: [id number] Arrival date & time: 07/27/22  0018     History  Chief Complaint  Patient presents with   Irregular Heart Beat    Renee Bradley is a 76 y.o. female.  Patient comes to the emergency department by ambulance from home.  Patient called an ambulance today because she had onset of a strange sensation in her left hand.  She did not have any associated chest pain, shortness of breath, dizziness, passing out.  EMS identified her to be in atrial fibrillation with a rapid ventricular response.  She does not have a history of A-fib.  She is not on blood thinners.  She was given a fluid bolus and 10 mg of IV Cardizem, heart rate has significantly improved.       Home Medications Prior to Admission medications   Medication Sig Start Date End Date Taking? Authorizing Provider  amLODipine (NORVASC) 5 MG tablet Take 1 tablet (5 mg total) by mouth daily. 10/07/20  Yes Dorothyann Peng, MD  aspirin EC 81 MG tablet Take 81 mg by mouth daily.   Yes [provider]  atenolol (TENORMIN) 50 MG tablet TAKE 1 TABLET TWICE A DAY Patient taking differently: Take 50 mg by mouth 2 (two) times daily. 06/01/22  Yes Dorothyann Peng, MD  ergocalciferol (VITAMIN D2) 50000 units capsule Take 50,000 Units by mouth once a week.   Yes [provider]  furosemide (LASIX) 20 MG tablet TAKE 1 TABLET DAILY        *HIKMA/WEST-WARD MFR* Patient taking differently: Take 20 mg by mouth daily. 04/27/22  Yes Dorothyann Peng, MD  rosuvastatin (CRESTOR) 20 MG tablet Take 1 tablet (20 mg total) by mouth daily. Patient taking differently: Take 20 mg by mouth See admin instructions. Take 1 tablet by mouth every two days 07/20/22  Yes Dorothyann Peng, MD  COVID-19 mRNA bivalent vaccine, Pfizer, (PFIZER COVID-19 VAC BIVALENT) injection Inject into the muscle. Patient not taking: Reported on 12/02/2021 08/28/21   Judyann Munson, MD  Lancets  [facility] [place] PLUS [place]) MISC  05/12/18   [provider]  Lum Babe test strip  05/12/18   [provider]      Allergies    Janumet [sitagliptin-metformin hcl], Januvia [sitagliptin], Kombiglyze [saxagliptin-metformin er], and Elemental sulfur    Review of Systems   Review of Systems  Physical Exam Updated Vital Signs BP (!) 135/97   Pulse 92   Temp 99 F (37.2 C) (Oral)   Resp 14   Ht 5\' 3"  (1.6 m)   Wt 88.5 kg   SpO2 100%   BMI 34.54 kg/m  Physical Exam Vitals and nursing note reviewed.  Constitutional:      General: She is not in acute distress.    Appearance: She is well-developed.  HENT:     Head: Normocephalic and atraumatic.     Mouth/Throat:     Mouth: Mucous membranes are moist.  Eyes:     General: Vision grossly intact. Gaze aligned appropriately.     Extraocular Movements: Extraocular movements intact.     Conjunctiva/sclera: Conjunctivae normal.  Cardiovascular:     Rate and Rhythm: Normal rate. Rhythm irregular.     Pulses: Normal pulses.     Heart sounds: Normal heart sounds, S1 normal and S2 normal. No murmur heard.    No friction rub. No gallop.  Pulmonary:     Effort: Pulmonary effort is normal. No respiratory distress.  Breath sounds: Normal breath sounds.  Abdominal:     General: Bowel sounds are normal.     Palpations: Abdomen is soft.     Tenderness: There is no abdominal tenderness. There is no guarding or rebound.     Hernia: No hernia is present.  Musculoskeletal:        General: No swelling.     Cervical back: Full passive range of motion without pain, normal range of motion and neck supple. No spinous process tenderness or muscular tenderness. Normal range of motion.     Right lower leg: No edema.     Left lower leg: No edema.  Skin:    General: Skin is warm and dry.     Capillary Refill: Capillary refill takes less than 2 seconds.     Findings: No ecchymosis, erythema, rash or wound.  Neurological:      General: No focal deficit present.     Mental Status: She is alert and oriented to person, place, and time.     GCS: GCS eye subscore is 4. GCS verbal subscore is 5. GCS motor subscore is 6.     Cranial Nerves: Cranial nerves 2-12 are intact.     Sensory: Sensation is intact.     Motor: Motor function is intact.     Coordination: Coordination is intact.  Psychiatric:        Attention and Perception: Attention normal.        Mood and Affect: Mood normal.        Speech: Speech normal.        Behavior: Behavior normal.     ED Results / Procedures / Treatments   Labs (all labs ordered are listed, but only abnormal results are displayed) Labs Reviewed  BASIC METABOLIC PANEL - Abnormal; Notable for the following components:      Result Value   Chloride 112 (*)    CO2 21 (*)    Glucose, Bld 164 (*)    Creatinine, Ser 1.32 (*)    GFR, Estimated 42 (*)    All other components within normal limits  BRAIN NATRIURETIC PEPTIDE - Abnormal; Notable for the following components:   B Natriuretic Peptide 562.3 (*)    All other components within normal limits  CBC WITH DIFFERENTIAL/PLATELET  HEPARIN LEVEL (UNFRACTIONATED)  TROPONIN I (HIGH SENSITIVITY)  TROPONIN I (HIGH SENSITIVITY)    EKG None  Radiology CT ANGIO CHEST/ABD/PEL FOR DISSECTION W &/OR WO CONTRAST  Result Date: 07/27/2022 CLINICAL DATA:  Acute aortic syndrome suspected. EXAM: CT ANGIOGRAPHY CHEST, ABDOMEN AND PELVIS TECHNIQUE: Non-contrast CT of the chest was initially obtained. Multidetector CT imaging through the chest, abdomen and pelvis was performed using the standard protocol during bolus administration of intravenous contrast. Multiplanar reconstructed images and MIPs were obtained and reviewed to evaluate the vascular anatomy. RADIATION DOSE REDUCTION: This exam was performed according to the departmental dose-optimization program which includes automated exposure control, adjustment of the mA and/or kV according to  patient size and/or use of iterative reconstruction technique. CONTRAST:  143mL OMNIPAQUE IOHEXOL 350 MG/ML SOLN COMPARISON:  CT abdomen pelvis dated 06/18/2021. FINDINGS: CTA CHEST FINDINGS Cardiovascular: Mild cardiomegaly with biatrial dilatation. No pericardial effusion. The thoracic aorta is unremarkable. No pulmonary artery embolus identified. Mediastinum/Nodes: No hilar or mediastinal adenopathy. The esophagus is grossly unremarkable. No mediastinal fluid collection. Lungs/Pleura: No focal consolidation, pleural effusion, or pneumothorax. The central airways are patent. Musculoskeletal: Degenerative changes of the spine. No acute osseous pathology. Review of the MIP images confirms  the above findings. CTA ABDOMEN AND PELVIS FINDINGS VASCULAR Aorta: Mild atherosclerotic calcification of the aorta. No aneurysmal dilatation or dissection. No periaortic fluid collection. Celiac: Patent without evidence of aneurysm, dissection, vasculitis or significant stenosis. SMA: Patent without evidence of aneurysm, dissection, vasculitis or significant stenosis. Renals: Both renal arteries are patent without evidence of aneurysm, dissection, vasculitis, fibromuscular dysplasia or significant stenosis. IMA: Patent without evidence of aneurysm, dissection, vasculitis or significant stenosis. Inflow: Mild atherosclerotic calcification of the iliac arteries. No aneurysmal dilatation or dissection. The iliac arteries are patent. Veins: No obvious venous abnormality within the limitations of this arterial phase study. Review of the MIP images confirms the above findings. NON-VASCULAR No intra-abdominal free air or free fluid. Hepatobiliary: The liver is unremarkable. No biliary dilatation. Several gallstones. No pericholecystic fluid or evidence of acute cholecystitis by CT. Pancreas: Unremarkable. No pancreatic ductal dilatation or surrounding inflammatory changes. Spleen: Normal in size without focal abnormality.  Adrenals/Urinary Tract: The adrenal glands unremarkable the kidneys, visualized ureters, and urinary bladder appear unremarkable. Stomach/Bowel: There is sigmoid diverticulosis and scattered colonic diverticula without active inflammatory changes. There is no bowel obstruction or active inflammation. The appendix is normal. Lymphatic: No adenopathy. Reproductive: Hysterectomy.  No adnexal masses. Other: None Musculoskeletal: Degenerative changes of the left hip and spine. No acute osseous pathology. Review of the MIP images confirms the above findings. IMPRESSION: 1. No acute intrathoracic, abdominal, or pelvic pathology. No aortic dissection or aneurysm. 2. Cholelithiasis. 3. Colonic diverticulosis. No bowel obstruction. Normal appendix. Electronically Signed   By: Elgie Collard M.D.   On: 07/27/2022 02:06   DG Chest Port 1 View  Result Date: 07/27/2022 CLINICAL DATA:  Tachycardia EXAM: PORTABLE CHEST 1 VIEW COMPARISON:  07/31/2016 FINDINGS: Increased size of the cardiomediastinal silhouette since 07/31/2016 is at least in part due to differences in technique and patient rotation however is somewhat greater than expected. No focal consolidation, pleural effusion, or pneumothorax. No acute osseous abnormality. IMPRESSION: Increased size of the cardiomediastinal silhouette since 07/31/2016 is at least in part due to differences in technique however is somewhat greater than expected. CTA of the chest is recommended if there is concern for pericardial effusion and/or acute aortic syndrome. These results were called by telephone at the time of interpretation on 07/27/2022 at 12:52 am to provider Sharilyn Sites, PA, who verbally acknowledged these results. Electronically Signed   By: Minerva Fester M.D.   On: 07/27/2022 00:52    Procedures Procedures    Medications Ordered in ED Medications  heparin bolus via infusion 3,000 Units (has no administration in time range)  heparin ADULT infusion 100 units/mL  (25000 units/221mL) (has no administration in time range)  iohexol (OMNIPAQUE) 350 MG/ML injection 100 mL (100 mLs Intravenous Contrast Given 07/27/22 0149)    ED Course/ Medical Decision Making/ A&P       CHA2DS2-VASc Score: 5                    Medical Decision Making Amount and/or Complexity of Data Reviewed External Data Reviewed: labs and ECG. Labs: ordered. Decision-making details documented in ED Course. Radiology: ordered and independent interpretation performed. Decision-making details documented in ED Course. ECG/medicine tests: ordered and independent interpretation performed. Decision-making details documented in ED Course.  Risk Prescription drug management. Decision regarding hospitalization.   Presents to the department for evaluation of rapid heartbeat.  Patient initially called EMS because she had some strange sensations in the left hand area that resolved.  No actual chest pain.  EMS, however, found the patient to be in atrial fibrillation with a rapid ventricular response upon their arrival.  Patient with a heart rate as high as 190 bpm.  No hypotension or instability.  She was given Cardizem 10 mg IV bolus by EMS and heart rate significantly improved.  Patient does not have a history of atrial fibrillation.  Reviewing her records does reveal a history of some heart palpitations in the past but no arrhythmia diagnosis.   CHA2DS2/VAS Stroke Risk Points  Current as of 10 minutes ago     5 >= 2 Points: High Risk  1 - 1.99 Points: Medium Risk  0 Points: Low Risk    No Change      Details    This score determines the patient's risk of having a stroke if the  patient has atrial fibrillation.       Points Metrics  0 Has Congestive Heart Failure:  No    Current as of 10 minutes ago  0 Has Vascular Disease:  No    Current as of 10 minutes ago  1 Has Hypertension:  Yes    Current as of 10 minutes ago  2 Age:  61    Current as of 10 minutes ago  1 Has Diabetes:   Yes    Current as of 10 minutes ago  0 Had Stroke:  No  Had TIA:  No  Had Thromboembolism:  No    Current as of 10 minutes ago  1 Female:  Yes    Current as of 10 minutes ago    Patient continues to be fairly well rate controlled here in the department.  Patient noted, however, to have wide mediastinum and enlarged heart on x-ray.  CT angiography performed.  No evidence of aortic dissection or aortic abnormality, patient does have a dilated heart.  She is not particularly short of breath, does not look overtly volume overloaded but does have an elevated BNP.  Suspect that there is undiagnosed congestive heart failure.  Patient initiated on heparin.  Cardiology consulted to admit the patient.  CRITICAL CARE Performed by: Gilda Crease   Total critical care time: 33 minutes  Critical care time was exclusive of separately billable procedures and treating other patients.  Critical care was necessary to treat or prevent imminent or life-threatening deterioration.  Critical care was time spent personally by me on the following activities: development of treatment plan with patient and/or surrogate as well as nursing, discussions with consultants, evaluation of patient's response to treatment, examination of patient, obtaining history from patient or surrogate, ordering and performing treatments and interventions, ordering and review of laboratory studies, ordering and review of radiographic studies, pulse oximetry and re-evaluation of patient's condition.                    Final Clinical Impression(s) / ED Diagnoses Final diagnoses:  Atrial fibrillation with RVR [facility])    Rx / DC Orders ED Discharge Orders     None         Silvino Selman, Canary Brim, MD 07/27/22 [phone]

## 2022-07-27 NOTE — Plan of Care (Signed)

## 2022-07-27 NOTE — ED Triage Notes (Signed)
Pt BIB EMS from home pt called out for "no feeling" in L elbow to hand. States that it feels like someone has their hand laying on her hand. Pt found to be in a-fib at a rate of 150-180. Pt given NS and 10mg  Cardizem. HR improved to 80-110. Denies CP, ShOB and N/V.

## 2022-07-27 NOTE — Progress Notes (Signed)
ANTICOAGULATION CONSULT NOTE - Initial Consult  Pharmacy Consult for heparin Indication: atrial fibrillation  Allergies  Allergen Reactions   Janumet [Sitagliptin-Metformin Hcl] Shortness Of Breath   Januvia [Sitagliptin] Shortness Of Breath   Kombiglyze [Saxagliptin-Metformin Er] Shortness Of Breath   Elemental Sulfur Itching    Patient Measurements: Height: 5\' 2"  (157.5 cm) Weight: 87.5 kg (193 lb) IBW/kg (Calculated) : 50.1 Heparin Dosing Weight: 75kg  Vital Signs: Temp: 98.2 F (36.8 C) (12/25 1243) Temp Source: Oral (12/25 1243) BP: 124/95 (12/25 1243) Pulse Rate: 95 (12/25 1243)  Labs: Recent Labs    07/27/22 0031 07/27/22 0231 07/27/22 1253  HGB 13.7  --   --   HCT 41.6  --   --   PLT 187  --   --   HEPARINUNFRC  --   --  0.88*  CREATININE 1.32*  --   --   TROPONINIHS 6 7  --      Estimated Creatinine Clearance: 37.8 mL/min (A) (by C-G formula based on SCr of 1.32 mg/dL (H)).   Medical History: Past Medical History:  Diagnosis Date   Asthma    Chest pressure 07/13/2012   Diabetes mellitus without complication (HCC)    DM (diabetes mellitus) (HCC) 07/13/2012   HTN (hypertension) 07/13/2012   Hypercholesterolemia    Hypertension    SOB (shortness of breath) 07/13/2012   Tachycardia, unspecified, "fluttering" 07/13/2012    Assessment: 76yo female c/o strange sensation in LUE, found by EMS to be in Afb w/ RVR, given diltiazem bolus and now to start heparin.  Heparin level is supratherapeutic at 0.88, CBC is wnl with no bleeding noted  Goal of Therapy:  Heparin level 0.3-0.7 units/ml Monitor platelets by anticoagulation protocol: Yes   Plan:  decrease heparin to 1000 units/hr. Collect heparin level in 8 hours Monitor heparin levels and CBC.  14/06/2012, PharmD PGY1 Pharmacy Resident 07/27/2022 1:37 PM

## 2022-07-28 ENCOUNTER — Observation Stay (HOSPITAL_COMMUNITY): Payer: Medicare Other

## 2022-07-28 ENCOUNTER — Other Ambulatory Visit (HOSPITAL_COMMUNITY): Payer: Self-pay

## 2022-07-28 DIAGNOSIS — I639 Cerebral infarction, unspecified: Secondary | ICD-10-CM

## 2022-07-28 DIAGNOSIS — I4891 Unspecified atrial fibrillation: Secondary | ICD-10-CM | POA: Diagnosis not present

## 2022-07-28 LAB — CBC
HCT: 39.9 % (ref 36.0–46.0)
Hemoglobin: 12.6 g/dL (ref 12.0–15.0)
MCH: 28.7 pg (ref 26.0–34.0)
MCHC: 31.6 g/dL (ref 30.0–36.0)
MCV: 90.9 fL (ref 80.0–100.0)
Platelets: 152 10*3/uL (ref 150–400)
RBC: 4.39 MIL/uL (ref 3.87–5.11)
RDW: 14.9 % (ref 11.5–15.5)
WBC: 3.9 10*3/uL — ABNORMAL LOW (ref 4.0–10.5)
nRBC: 0 % (ref 0.0–0.2)

## 2022-07-28 LAB — BASIC METABOLIC PANEL
Anion gap: 8 (ref 5–15)
BUN: 11 mg/dL (ref 8–23)
CO2: 22 mmol/L (ref 22–32)
Calcium: 8.9 mg/dL (ref 8.9–10.3)
Chloride: 115 mmol/L — ABNORMAL HIGH (ref 98–111)
Creatinine, Ser: 0.8 mg/dL (ref 0.44–1.00)
GFR, Estimated: >60 mL/min (ref >60)
Glucose, Bld: 78 mg/dL (ref 70–99)
Potassium: 3.4 mmol/L — ABNORMAL LOW (ref 3.5–5.1)
Sodium: 145 mmol/L (ref 135–145)

## 2022-07-28 LAB — HEPARIN LEVEL (UNFRACTIONATED)
Heparin Unfractionated: 0.7 IU/mL (ref 0.30–0.70)
Heparin Unfractionated: 0.45 IU/mL (ref 0.30–0.70)

## 2022-07-28 MED ORDER — STROKE: EARLY STAGES OF RECOVERY BOOK
Freq: Once | Status: AC
  Filled 2022-07-28: qty 1

## 2022-07-28 MED ORDER — POTASSIUM CHLORIDE CRYS ER 20 MEQ PO TBCR
40.0000 meq | EXTENDED_RELEASE_TABLET | Freq: Once | ORAL | Status: AC
  Administered 2022-07-28: 40 meq via ORAL
  Filled 2022-07-28: qty 2

## 2022-07-28 MED ORDER — IOHEXOL 350 MG/ML SOLN
75.0000 mL | Freq: Once | INTRAVENOUS | Status: AC | PRN
  Administered 2022-07-28: 75 mL via INTRAVENOUS

## 2022-07-28 MED ORDER — ACETAMINOPHEN 500 MG PO TABS
1000.0000 mg | ORAL_TABLET | Freq: Four times a day (QID) | ORAL | Status: DC | PRN
  Administered 2022-07-29 – 2022-07-30 (×2): 1000 mg via ORAL
  Filled 2022-07-28 (×2): qty 2

## 2022-07-28 NOTE — Progress Notes (Signed)
Rounding Note    Patient Name: Renee Bradley Date of Encounter: 07/28/2022  [place] Cardiologist:   Gwenlyn Found   Subjective   76 yo with new dx of atrial fib  Hx of diet controlled DM   Presented with numbness / tingling down her left arm  HR at presentation was 190   Inpatient Medications    Scheduled Meds:  amLODipine  5 mg Oral Daily   furosemide  40 mg Intravenous Once   metoprolol tartrate  25 mg Oral BID   rosuvastatin  20 mg Oral Daily   Continuous Infusions:  heparin 800 Units/hr (07/28/22 0348)   PRN Meds:    Vital Signs    Vitals:   07/27/22 1700 07/27/22 1955 07/27/22 2338 07/28/22 0424  BP: (!) 126/92 118/71 (!) 140/82 133/77  Pulse: 92 79 99 85  Resp: 18 18 20 17   Temp: 98.3 F (36.8 C) 98 F (36.7 C) 97.9 F (36.6 C) 98 F (36.7 C)  TempSrc: Oral Oral Oral Oral  SpO2: 98% 97% 93% 92%  Weight:    86.7 kg  Height:        Intake/Output Summary (Last 24 hours) at 07/28/2022 0900 Last data filed at 07/28/2022 0013 Gross per 24 hour  Intake 332.32 ml  Output --  Net 332.32 ml      07/28/2022    4:24 AM 07/27/2022    8:14 AM 07/27/2022   12:28 AM  Last 3 Weights  Weight (lbs) 191 lb 3.2 oz 193 lb 195 lb  Weight (kg) 86.728 kg 87.544 kg 88.451 kg      Telemetry    Afib with controlled V response  - Personally Reviewed  ECG     - Personally Reviewed  Physical Exam   GEN: No acute distress.   Neck: No JVD Cardiac: RRR, no murmurs, rubs, or gallops.  Respiratory: Clear to auscultation bilaterally. GI: Soft, nontender, non-distended  MS: No edema; No deformity. Neuro:  left hand - still weak, tingling ,  unable to do fine motor skills  Psych: Normal affect   Labs    High Sensitivity Troponin:   Recent Labs  Lab 07/27/22 0031 07/27/22 0231  TROPONINIHS 6 7     Chemistry Recent Labs  Lab 07/27/22 0031 07/28/22 0108  NA 143 145  K 4.6 3.4*  CL 112* 115*  CO2 21* 22  GLUCOSE 164* 78  BUN 20 11   CREATININE 1.32* 0.80  CALCIUM 9.0 8.9  GFRNONAA 42* >60  ANIONGAP 10 8    Lipids No results for input(s): "CHOL", "TRIG", "HDL", "LABVLDL", "LDLCALC", "CHOLHDL" in the last 168 hours.  Hematology Recent Labs  Lab 07/27/22 0031 07/28/22 0108  WBC 4.9 3.9*  RBC 4.68 4.39  HGB 13.7 12.6  HCT 41.6 39.9  MCV 88.9 90.9  MCH 29.3 28.7  MCHC 32.9 31.6  RDW 14.7 14.9  PLT 187 152   Thyroid No results for input(s): "TSH", "FREET4" in the last 168 hours.  BNP Recent Labs  Lab 07/27/22 0031  BNP 562.3*    DDimer No results for input(s): "DDIMER" in the last 168 hours.   Radiology    ECHOCARDIOGRAM COMPLETE  Result Date: 07/27/2022    ECHOCARDIOGRAM REPORT   Patient Name:   VELETA GUCK Date of Exam: 07/27/2022 Medical Rec #:  [id number]       Height:       63.0 in Accession #:    [id number]  Weight:       195.0 lb Date of Birth:  04-Mar-1946       BSA:          1.913 m Patient Age:    59 years        BP:           113/79 mmHg Patient Gender: F               HR:           83 bpm. Exam Location:  Inpatient Procedure: 2D Echo Indications:    atrial fibrillation  History:        Patient has prior history of Echocardiogram examinations, most                 recent 08/10/2012. Arrythmias:Tachycardia, Signs/Symptoms:Dyspnea                 and Edema; Risk Factors:Diabetes, Hypertension and Dyslipidemia.  Sonographer:    Johny Chess RDCS Referring Phys: [id number] [facility] TANNU  Sonographer Comments: Image acquisition challenging due to respiratory motion. IMPRESSIONS  1. Left ventricular ejection fraction, by estimation, is 30 to 35%. The left ventricle has moderately decreased function. The left ventricle demonstrates global hypokinesis. Left ventricular diastolic function could not be evaluated.  2. Right ventricular systolic function is normal. The right ventricular size is normal. There is normal pulmonary artery systolic pressure.  3. Left atrial size was severely dilated.  4. Right  atrial size was moderately dilated.  5. The mitral valve is normal in structure. Trivial mitral valve regurgitation. No evidence of mitral stenosis.  6. The aortic valve is tricuspid. Aortic valve regurgitation is not visualized. No aortic stenosis is present.  7. The inferior vena cava is normal in size with greater than 50% respiratory variability, suggesting right atrial pressure of 3 mmHg. FINDINGS  Left Ventricle: Left ventricular ejection fraction, by estimation, is 30 to 35%. The left ventricle has moderately decreased function. The left ventricle demonstrates global hypokinesis. The left ventricular internal cavity size was normal in size. There is no left ventricular hypertrophy. Left ventricular diastolic function could not be evaluated due to atrial fibrillation. Left ventricular diastolic function could not be evaluated. Right Ventricle: The right ventricular size is normal. No increase in right ventricular wall thickness. Right ventricular systolic function is normal. There is normal pulmonary artery systolic pressure. The tricuspid regurgitant velocity is 2.59 m/s, and  with an assumed right atrial pressure of 3 mmHg, the estimated right ventricular systolic pressure is [id number] mmHg. Left Atrium: Left atrial size was severely dilated. Right Atrium: Right atrial size was moderately dilated. Pericardium: There is no evidence of pericardial effusion. Mitral Valve: The mitral valve is normal in structure. Trivial mitral valve regurgitation. No evidence of mitral valve stenosis. Tricuspid Valve: The tricuspid valve is normal in structure. Tricuspid valve regurgitation is mild . No evidence of tricuspid stenosis. Aortic Valve: The aortic valve is tricuspid. Aortic valve regurgitation is not visualized. No aortic stenosis is present. Pulmonic Valve: The pulmonic valve was normal in structure. Pulmonic valve regurgitation is not visualized. No evidence of pulmonic stenosis. Aorta: The aortic root is normal in  size and structure. Venous: The inferior vena cava is normal in size with greater than 50% respiratory variability, suggesting right atrial pressure of 3 mmHg. IAS/Shunts: No atrial level shunt detected by color flow Doppler.  LEFT VENTRICLE PLAX 2D LVIDd:         5.10 cm LVIDs:  4.20 cm LV PW:         1.00 cm LV IVS:        0.90 cm LVOT diam:     1.80 cm LVOT Area:     2.54 cm  IVC IVC diam: 1.80 cm LEFT ATRIUM             Index        RIGHT ATRIUM           Index LA diam:        4.20 cm 2.20 cm/m   RA Area:     21.40 cm LA Vol (A2C):   79.5 ml 41.56 ml/m  RA Volume:   66.30 ml  34.66 ml/m LA Vol (A4C):   67.0 ml 35.02 ml/m LA Biplane Vol: 74.6 ml 39.00 ml/m   AORTA Ao Root diam: 2.80 cm TRICUSPID VALVE TR Peak grad:   26.8 mmHg TR Vmax:        259.00 cm/s  SHUNTS Systemic Diam: 1.80 cm Skeet Latch MD Electronically signed by Skeet Latch MD Signature Date/Time: 07/27/2022/11:10:33 AM    Final    CT ANGIO CHEST/ABD/PEL FOR DISSECTION W &/OR WO CONTRAST  Result Date: 07/27/2022 CLINICAL DATA:  Acute aortic syndrome suspected. EXAM: CT ANGIOGRAPHY CHEST, ABDOMEN AND PELVIS TECHNIQUE: Non-contrast CT of the chest was initially obtained. Multidetector CT imaging through the chest, abdomen and pelvis was performed using the standard protocol during bolus administration of intravenous contrast. Multiplanar reconstructed images and MIPs were obtained and reviewed to evaluate the vascular anatomy. RADIATION DOSE REDUCTION: This exam was performed according to the departmental dose-optimization program which includes automated exposure control, adjustment of the mA and/or kV according to patient size and/or use of iterative reconstruction technique. CONTRAST:  163mL OMNIPAQUE IOHEXOL 350 MG/ML SOLN COMPARISON:  CT abdomen pelvis dated 06/18/2021. FINDINGS: CTA CHEST FINDINGS Cardiovascular: Mild cardiomegaly with biatrial dilatation. No pericardial effusion. The thoracic aorta is unremarkable. No  pulmonary artery embolus identified. Mediastinum/Nodes: No hilar or mediastinal adenopathy. The esophagus is grossly unremarkable. No mediastinal fluid collection. Lungs/Pleura: No focal consolidation, pleural effusion, or pneumothorax. The central airways are patent. Musculoskeletal: Degenerative changes of the spine. No acute osseous pathology. Review of the MIP images confirms the above findings. CTA ABDOMEN AND PELVIS FINDINGS VASCULAR Aorta: Mild atherosclerotic calcification of the aorta. No aneurysmal dilatation or dissection. No periaortic fluid collection. Celiac: Patent without evidence of aneurysm, dissection, vasculitis or significant stenosis. SMA: Patent without evidence of aneurysm, dissection, vasculitis or significant stenosis. Renals: Both renal arteries are patent without evidence of aneurysm, dissection, vasculitis, fibromuscular dysplasia or significant stenosis. IMA: Patent without evidence of aneurysm, dissection, vasculitis or significant stenosis. Inflow: Mild atherosclerotic calcification of the iliac arteries. No aneurysmal dilatation or dissection. The iliac arteries are patent. Veins: No obvious venous abnormality within the limitations of this arterial phase study. Review of the MIP images confirms the above findings. NON-VASCULAR No intra-abdominal free air or free fluid. Hepatobiliary: The liver is unremarkable. No biliary dilatation. Several gallstones. No pericholecystic fluid or evidence of acute cholecystitis by CT. Pancreas: Unremarkable. No pancreatic ductal dilatation or surrounding inflammatory changes. Spleen: Normal in size without focal abnormality. Adrenals/Urinary Tract: The adrenal glands unremarkable the kidneys, visualized ureters, and urinary bladder appear unremarkable. Stomach/Bowel: There is sigmoid diverticulosis and scattered colonic diverticula without active inflammatory changes. There is no bowel obstruction or active inflammation. The appendix is normal.  Lymphatic: No adenopathy. Reproductive: Hysterectomy.  No adnexal masses. Other: None Musculoskeletal: Degenerative changes of the left  hip and spine. No acute osseous pathology. Review of the MIP images confirms the above findings. IMPRESSION: 1. No acute intrathoracic, abdominal, or pelvic pathology. No aortic dissection or aneurysm. 2. Cholelithiasis. 3. Colonic diverticulosis. No bowel obstruction. Normal appendix. Electronically Signed   By: Elgie Collard M.D.   On: 07/27/2022 02:06   DG Chest Port 1 View  Result Date: 07/27/2022 CLINICAL DATA:  Tachycardia EXAM: PORTABLE CHEST 1 VIEW COMPARISON:  07/31/2016 FINDINGS: Increased size of the cardiomediastinal silhouette since 07/31/2016 is at least in part due to differences in technique and patient rotation however is somewhat greater than expected. No focal consolidation, pleural effusion, or pneumothorax. No acute osseous abnormality. IMPRESSION: Increased size of the cardiomediastinal silhouette since 07/31/2016 is at least in part due to differences in technique however is somewhat greater than expected. CTA of the chest is recommended if there is concern for pericardial effusion and/or acute aortic syndrome. These results were called by telephone at the time of interpretation on 07/27/2022 at 12:52 am to provider Sharilyn Sites, PA, who verbally acknowledged these results. Electronically Signed   By: Minerva Fester M.D.   On: 07/27/2022 00:52    Cardiac Studies     Patient Profile     76 y.o. female with new onset Afib,  persistant left arm weakness, tingling    Assessment & Plan      Atrial fib :  HR is better controlled .   She is currently on IV heparin .  Would like to transition to Eliquis once she is seen by the stroke team and we have the OK to transition to PO eliquis .    She is asymptomatic.   I would favor DC to home in the next day or so with OP cardioversion in 3-4 weeks.  2.  Possible new stroke.  Has had left arm  tingling , weakness.  Lack of coordination ( unable to brush her hair) . No other neurological deficits from what I can tell .   Will consult the stroke team          For questions or updates, please contact [facility] Please consult [email or web address] for contact info under        Signed, Kristeen Miss, MD  07/28/2022, 9:00 AM

## 2022-07-28 NOTE — Progress Notes (Signed)
   Heart Failure Stewardship Pharmacist Progress Note   PCP: Dorothyann Peng, MD PCP-Cardiologist: None    HPI:  76 yo F with PMH of T2DM, obesity, and HTN.   Presented to the ED on 12/25 with L arm numbness. Found to be in afib RVR. CTA shows mild cardiomegaly w/ biatrial dilation, no aortic dissection, and no pleural effusion. BNP elevated. Stroke team consulted to evaluate numbness, tingling, and lack of coordination. Pending MRI of brain. ECHO 12/25 showed LVEF 30-35% (previously 55-60%), global hypokinesis, and RV normal.   Current HF Medications: Diuretic: furosemide 40 mg IV x 1 (patient/family refused on 12/25) Beta Blocker: metoprolol tartrate 25 mg BID  Prior to admission HF Medications: Diuretic: furosemide 20 mg daily  Pertinent Lab Values: Serum creatinine 0.80, BUN 11, Potassium 3.4, Sodium 145, BNP 562.3, A1c 6.5   Vital Signs: Weight: 191 lbs (admission weight: 193 lbs) Blood pressure: 130/80s  Heart rate: variable in afib  I/O: not well documented   Medication Assistance / Insurance Benefits Check: Does the patient have prescription insurance?  Yes Type of insurance plan: Development worker, community Plan  Outpatient Pharmacy:  Prior to admission [facility] Is the patient willing to use [facility] TOC pharmacy at discharge? Yes Is the patient willing to transition their outpatient pharmacy to utilize a [facility] outpatient pharmacy?   Pending    Assessment: 1. Acute systolic CHF (LVEF 30-35%). NYHA class II symptoms. - Not volume overloaded on exam. Strict I/Os and daily weights. Keep K>4, recommend KCl 40 mEq x 1. Check magnesium in AM. - Continue metoprolol tartrate 25 mg BID - Consider further GDMT once stroke evaluation completed   Plan: 1) Medication changes recommended at this time: - KCl 40 mEq x 1 - Check magnesium in AM  2) Patient assistance: - Has federal employee insurance, can use monthly copay cards  3)  Education  - To be completed  prior to discharge  Sharen Hones, PharmD, BCPS Heart Failure Engineer, building services Phone [phone]

## 2022-07-28 NOTE — Progress Notes (Signed)
Notified by nurse re: headache - per discussion, patient has had intermittently throughout the day and also mentioned to neurology. Did not have any PRNs ordered so ordered Tylenol. No other focal deficits. RN reviewed with neurology who agreed with Tylenol - would pursue head CT if this worsens but otherwise OK to follow with analgesic for now.

## 2022-07-28 NOTE — Consult Note (Addendum)
Neurology Consultation  Reason for Consult: Left arm numbness and clumsiness, MRI positive for stroke Referring Physician: Dr. Cathie Olden  CC: Left arm numbness and clumsiness  History is obtained from: Patient and chart  HPI: Renee Bradley is a 76 y.o. female with history of hypertension, diabetes and obesity who presented on 12/25 with numbness and clumsiness of her left arm.  She noted that she was unable to hold certain objects and endorsed numbness and tingling throughout the arm.  When patient arrived to the hospital, she was found to be in A-fib with RVR.  She was admitted to cardiology.  She is currently on metoprolol for rate control and heparin for anticoagulation.  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 Does not report any visual symptoms or headaches.  LKW: 12/25 some time in the AM TNK given?: no, outside of window IR Thrombectomy? No, outside of window Modified Rankin Scale: 0-Completely asymptomatic and back to baseline post- stroke  ROS: A complete ROS was performed and is negative except as noted in the HPI.   Past Medical History:  Diagnosis Date   Asthma    Chest pressure 07/13/2012   Diabetes mellitus without complication (HCC)    DM (diabetes mellitus) ([place]) 07/13/2012   HTN (hypertension) 07/13/2012   Hypercholesterolemia    Hypertension    SOB (shortness of breath) 07/13/2012   Tachycardia, unspecified, "fluttering" 07/13/2012     Family History  Problem Relation Age of Onset   Heart attack Mother    Diabetes type II Mother    Stroke Mother    Alzheimer's disease Father    Diabetes type II Brother      Social History:   reports that she quit smoking about 39 years ago. Her smoking use included cigarettes. She has never used smokeless tobacco. She reports that she does not drink alcohol and does not use drugs.  Medications  Current Facility-Administered Medications:    amLODipine (NORVASC)  tablet 5 mg, 5 mg, Oral, Daily, Tannu, Manasi, MD, 5 mg at 07/28/22 0915   heparin ADULT infusion 100 units/mL (25000 units/213mL), 800 Units/hr, Intravenous, Continuous, Laren Everts, RPH, Last Rate: 8 mL/hr at 07/28/22 0348, 800 Units/hr at 07/28/22 0348   metoprolol tartrate (LOPRESSOR) tablet 25 mg, 25 mg, Oral, BID, Tannu, Manasi, MD, 25 mg at 07/28/22 0915   rosuvastatin (CRESTOR) tablet 20 mg, 20 mg, Oral, Daily, Tannu, Manasi, MD, 20 mg at 07/27/22 0848   Exam: Current vital signs: BP 127/88 (BP Location: Left Wrist)   Pulse 61   Temp 98 F (36.7 C) (Oral)   Resp (!) 22   Ht 5\' 2"  (1.575 m)   Wt 86.7 kg   SpO2 96%   BMI 34.97 kg/m  Vital signs in last 24 hours: Temp:  [97.9 F (36.6 C)-98.3 F (36.8 C)] 98 F (36.7 C) (12/26 0939) Pulse Rate:  [61-108] 61 (12/26 0939) Resp:  [17-22] 22 (12/26 0939) BP: (118-158)/(71-99) 127/88 (12/26 0939) SpO2:  [92 %-98 %] 96 % (12/26 0939) Weight:  [86.7 kg] 86.7 kg (12/26 0424)  GENERAL: Awake, alert, in no acute distress Psych: Affect appropriate for situation, patient is calm and cooperative with examination Head: Normocephalic and atraumatic, without obvious abnormality EENT: Normal conjunctivae, moist mucous membranes, no OP obstruction LUNGS: Normal respiratory effort. Non-labored breathing on room air CV: Regular rate and rhythm on telemetry ABDOMEN: Soft, non-tender, non-distended Extremities: warm, well perfused, without obvious deformity  NEURO:  Mental Status: Awake, alert,  and oriented to person, place, time, and situation. She is able to provide a clear and coherent history of present illness. Speech/Language: speech is clear and fluent.   Naming, repetition, fluency, and comprehension intact without aphasia  No neglect is noted Cranial Nerves:  II: PERRL visual fields full.  III, IV, VI: EOMI. Lid elevation symmetric and full.  V: Sensation is intact to light touch and symmetrical to face. VII: Very slight  left facial droop VIII: Hearing intact to voice IX, X: Palate elevation is symmetric. Phonation normal.  XI: Normal sternocleidomastoid and trapezius muscle strength XII: Tongue protrudes midline without fasciculations.   Motor: 5/5 strength is all muscle groups.  Tone is normal. Bulk is normal.  Sensation: Intact to light touch bilaterally in all four extremities. No extinction to DSS present.  Coordination: FTN intact bilaterally. . No pronator drift. Alternating hand movements and fine finger movements clumsier and slower on the left Gait: Deferred  NIHSS: 1a Level of Conscious.: 0 1b LOC Questions: 0 1c LOC Commands: 0 2 Best Gaze: 0 3 Visual: 0 4 Facial Palsy: 1 5a Motor Arm - left: 0 5b Motor Arm - Right: 0 6a Motor Leg - Left: 0 6b Motor Leg - Right: 0 7 Limb Ataxia: 0 8 Sensory: 1 9 Best Language: 0 10 Dysarthria: 0 11 Extinct. and Inatten.: 0 TOTAL: 2   Labs I have reviewed labs in epic and the results pertinent to this consultation are:   CBC    Component Value Date/Time   WBC 3.9 (L) 07/28/2022 0108   RBC 4.39 07/28/2022 0108   HGB 12.6 07/28/2022 0108   HGB 13.8 04/23/2022 1122   HCT 39.9 07/28/2022 0108   HCT 42.2 04/23/2022 1122   PLT 152 07/28/2022 0108   PLT 206 04/23/2022 1122   MCV 90.9 07/28/2022 0108   MCV 88 04/23/2022 1122   MCH 28.7 07/28/2022 0108   MCHC 31.6 07/28/2022 0108   RDW 14.9 07/28/2022 0108   RDW 14.8 04/23/2022 1122   LYMPHSABS 1.1 07/27/2022 0031   LYMPHSABS 1.3 03/13/2021 1619   MONOABS 0.3 07/27/2022 0031   EOSABS 0.1 07/27/2022 0031   EOSABS 0.0 03/13/2021 1619   BASOSABS 0.0 07/27/2022 0031   BASOSABS 0.0 03/13/2021 1619    CMP     Component Value Date/Time   NA 145 07/28/2022 0108   NA 145 (H) 04/23/2022 1122   K 3.4 (L) 07/28/2022 0108   CL 115 (H) 07/28/2022 0108   CO2 22 07/28/2022 0108   GLUCOSE 78 07/28/2022 0108   BUN 11 07/28/2022 0108   BUN 14 04/23/2022 1122   CREATININE 0.80 07/28/2022 0108    CREATININE 0.86 09/30/2015 1653   CALCIUM 8.9 07/28/2022 0108   PROT 6.3 04/23/2022 1122   ALBUMIN 4.5 04/23/2022 1122   AST 13 04/23/2022 1122   ALT 10 04/23/2022 1122   ALKPHOS 75 04/23/2022 1122   BILITOT 1.4 (H) 04/23/2022 1122   GFRNONAA >60 07/28/2022 0108   GFRAA 75 08/13/2020 1559    Lipid Panel     Component Value Date/Time   CHOL 159 04/23/2022 1122   TRIG 68 04/23/2022 1122   HDL 58 04/23/2022 1122   CHOLHDL 2.7 04/23/2022 1122   CHOLHDL 3.0 02/27/2014 1505   VLDL 21 02/27/2014 1505   LDLCALC 88 04/23/2022 1122     Imaging I have reviewed the images obtained:  MRI examination of the brain: Acute infarct in the right frontoparietal region with edema and petechial hemorrhage, chronic  white matter disease  Assessment: 76 year old patient with history of hypertension, obesity and diet-controlled diabetes presents with acute onset numbness and clumsiness of the left arm.  She was found to be in atrial fibrillation with RVR on arrival, rate has been controlled and she is now anticoagulated with heparin.  MRI shows acute infarct in right frontoparietal region.  Patient will need full stroke workup with vessel imaging, echocardiogram, lipid panel and A1c in the morning.  Impression: Acute ischemic stroke in right frontoparietal region-etiology likely cardioembolic from the new onset atrial fibrillation.  Recommendations: Stroke/TIA Workup  - Admit for stroke workup - Permissive HTN x48 hrs from sx onset goal BP <220/110. PRN labetalol or hydralazine if BP above these parameters. Avoid oral antihypertensives. - CTA head and neck - TTE w/ bubble - Check A1c and LDL + add statin per guidelines -Fully anticoagulated with heparin-okay to continue heparin stroke protocol. - q4 hr neuro checks - STAT head CT for any change in neuro exam - Tele - PT/OT/SLP - Stroke education - Amb referral to neurology upon discharge    She will need to be eventually on a DOAC-timing to  be decided on stroke team rounding.  Pt seen by NP/Neuro and later by MD. Note/plan to be edited by MD as needed.  Cortney E Ernestina Columbia , MSN, AGACNP-BC [facility] See Amion for schedule and pager information 07/28/2022 4:42 PM  Attending Neurohospitalist Addendum Patient seen and examined with APP/Resident. Agree with the history and physical as documented above. Agree with the plan as documented, which I helped formulate. I have independently reviewed the chart, obtained history, review of systems and examined the patient.I have personally reviewed pertinent head/neck/spine imaging (CT/MRI). Please feel free to call with any questions.  Plan was communicated to Ronie Spies, NP, from the primary team.  -- Milon Dikes, MD Neurologist [facility] Pager: [phone]

## 2022-07-28 NOTE — Progress Notes (Signed)
ANTICOAGULATION CONSULT NOTE - Follow Up Consult  Pharmacy Consult for heparin Indication: atrial fibrillation  Labs: Recent Labs    07/27/22 0031 07/27/22 0231 07/27/22 1253 07/27/22 2226  HGB 13.7  --   --   --   HCT 41.6  --   --   --   PLT 187  --   --   --   HEPARINUNFRC  --   --  0.88* 0.97*  CREATININE 1.32*  --   --   --   TROPONINIHS 6 7  --   --     Assessment: 76yo female supratherapeutic on heparin with higher level despite decreased rate; no infusion issues or signs of bleeding per RN; RN dose note that lab was drawn from L hand and infusion running in L AC, could be affecting labs but lab draw should be distal enough for this to be minimal.  Goal of Therapy:  Heparin level 0.3-0.7 units/ml   Plan:  Will decrease heparin infusion by 2-3 units/kg/hr to 800 units/hr and check level in 8 hours.    Vernard Gambles, PharmD, BCPS  07/28/2022,12:06 AM

## 2022-07-28 NOTE — Progress Notes (Addendum)
Per d/w Dr. Elease Hashimoto and Dr. Wilford Corner, will place order for brain MRI WO contrast - if positive for stroke, neuro will consult.  Addendum: MRI positive for stroke. Dr. Wilford Corner will see in consult. Appreciate their assistance.

## 2022-07-28 NOTE — Progress Notes (Signed)
ANTICOAGULATION CONSULT NOTE - Follow Up  Pharmacy Consult for heparin Indication: atrial fibrillation  Allergies  Allergen Reactions   Janumet [Sitagliptin-Metformin Hcl] Shortness Of Breath   Januvia [Sitagliptin] Shortness Of Breath   Kombiglyze [Saxagliptin-Metformin Er] Shortness Of Breath   Elemental Sulfur Itching    Patient Measurements: Height: 5\' 2"  (157.5 cm) Weight: 86.7 kg (191 lb 3.2 oz) IBW/kg (Calculated) : 50.1 Heparin Dosing Weight: 75kg  Vital Signs: Temp: 98 F (36.7 C) (12/26 0424) Temp Source: Oral (12/26 0424) BP: 133/77 (12/26 0424) Pulse Rate: 85 (12/26 0424)  Labs: Recent Labs    07/27/22 0031 07/27/22 0231 07/27/22 1253 07/27/22 2226 07/28/22 0108 07/28/22 0823  HGB 13.7  --   --   --  12.6  --   HCT 41.6  --   --   --  39.9  --   PLT 187  --   --   --  152  --   HEPARINUNFRC  --   --  0.88* 0.97*  --  0.70  CREATININE 1.32*  --   --   --  0.80  --   TROPONINIHS 6 7  --   --   --   --      Estimated Creatinine Clearance: 62.1 mL/min (by C-G formula based on SCr of 0.8 mg/dL).   Medical History: Past Medical History:  Diagnosis Date   Asthma    Chest pressure 07/13/2012   Diabetes mellitus without complication (HCC)    DM (diabetes mellitus) (HCC) 07/13/2012   HTN (hypertension) 07/13/2012   Hypercholesterolemia    Hypertension    SOB (shortness of breath) 07/13/2012   Tachycardia, unspecified, "fluttering" 07/13/2012    Assessment: 76yo female c/o strange sensation in LUE, found by EMS to be in Afb w/ RVR, given diltiazem bolus and now to start heparin.  Heparin level is therapeutic at 0.70, CBC is wnl with no bleeding noted  Goal of Therapy:  Heparin level 0.3-0.7 units/ml Monitor platelets by anticoagulation protocol: Yes   Plan:  Continue heparin at 800 units/hr. Collect confirmatory heparin level in 8h Monitor heparin levels and CBC daily  14/06/2012, PharmD PGY1 Pharmacy Resident 07/28/2022 9:21 AM

## 2022-07-28 NOTE — TOC Benefit Eligibility Note (Signed)
Patient Product/process development scientist completed.    The patient is currently admitted and upon discharge could be taking Xarelto 20 mg.  The current 30 day co-pay is $69.25.   The patient is currently admitted and upon discharge could be taking Eliquis 5 mg.  The current 30 day co-pay is $85.02.   The patient is insured through [employer]   Roland Earl, CPHT Pharmacy Patient Advocate Specialist [facility] Patient Advocate Team Direct Number: [phone]  Fax: [phone]

## 2022-07-28 NOTE — Progress Notes (Signed)
ANTICOAGULATION CONSULT NOTE - Follow Up  Pharmacy Consult for heparin Indication: atrial fibrillation  Allergies  Allergen Reactions   Janumet [Sitagliptin-Metformin Hcl] Shortness Of Breath   Januvia [Sitagliptin] Shortness Of Breath   Kombiglyze [Saxagliptin-Metformin Er] Shortness Of Breath   Elemental Sulfur Itching    Patient Measurements: Height: 5\' 2"  (157.5 cm) Weight: 86.7 kg (191 lb 3.2 oz) IBW/kg (Calculated) : 50.1 Heparin Dosing Weight: 75kg  Vital Signs: Temp: 98 F (36.7 C) (12/26 0939) Temp Source: Oral (12/26 0939) BP: 127/88 (12/26 0939) Pulse Rate: 61 (12/26 0939)  Labs: Recent Labs    07/27/22 0031 07/27/22 0231 07/27/22 1253 07/27/22 2226 07/28/22 0108 07/28/22 0823 07/28/22 1648  HGB 13.7  --   --   --  12.6  --   --   HCT 41.6  --   --   --  39.9  --   --   PLT 187  --   --   --  152  --   --   HEPARINUNFRC  --   --    < > 0.97*  --  0.70 0.45  CREATININE 1.32*  --   --   --  0.80  --   --   TROPONINIHS 6 7  --   --   --   --   --    < > = values in this interval not displayed.     Estimated Creatinine Clearance: 62.1 mL/min (by C-G formula based on SCr of 0.8 mg/dL).   Medical History: Past Medical History:  Diagnosis Date   Asthma    Chest pressure 07/13/2012   Diabetes mellitus without complication (HCC)    DM (diabetes mellitus) (HCC) 07/13/2012   HTN (hypertension) 07/13/2012   Hypercholesterolemia    Hypertension    SOB (shortness of breath) 07/13/2012   Tachycardia, unspecified, "fluttering" 07/13/2012    Assessment: 76yo female c/o strange sensation in LUE, found by EMS to be in Afb w/ RVR, given diltiazem bolus and now to start heparin.  Heparin level is therapeutic at 0.70, CBC is wnl with no bleeding noted  Heparin level therapeutic again this PM. Goal of Therapy:  Heparin level 0.3-0.7 units/ml Monitor platelets by anticoagulation protocol: Yes   Plan:  Continue heparin at 800 units/hr. Monitor heparin  levels and CBC daily  14/06/2012, PharmD, [place], AAHIVP, CPP Infectious Disease Pharmacist 07/28/2022 7:06 PM

## 2022-07-29 ENCOUNTER — Encounter (HOSPITAL_COMMUNITY): Payer: Self-pay | Admitting: Student

## 2022-07-29 DIAGNOSIS — I63411 Cerebral infarction due to embolism of right middle cerebral artery: Secondary | ICD-10-CM

## 2022-07-29 DIAGNOSIS — I4891 Unspecified atrial fibrillation: Secondary | ICD-10-CM | POA: Diagnosis not present

## 2022-07-29 LAB — CBC
HCT: 39.2 % (ref 36.0–46.0)
Hemoglobin: 13 g/dL (ref 12.0–15.0)
MCH: 29.1 pg (ref 26.0–34.0)
MCHC: 33.2 g/dL (ref 30.0–36.0)
MCV: 87.9 fL (ref 80.0–100.0)
Platelets: 152 10*3/uL (ref 150–400)
RBC: 4.46 MIL/uL (ref 3.87–5.11)
RDW: 15 % (ref 11.5–15.5)
WBC: 3.2 10*3/uL — ABNORMAL LOW (ref 4.0–10.5)
nRBC: 0 % (ref 0.0–0.2)

## 2022-07-29 LAB — LIPID PANEL
Cholesterol: 144 mg/dL (ref 0–200)
HDL: 55 mg/dL (ref >40)
LDL Cholesterol: 78 mg/dL (ref 0–99)
Total CHOL/HDL Ratio: 2.6 RATIO
Triglycerides: 55 mg/dL (ref <150)
VLDL: 11 mg/dL (ref 0–40)

## 2022-07-29 LAB — HEPARIN LEVEL (UNFRACTIONATED): Heparin Unfractionated: 0.54 IU/mL (ref 0.30–0.70)

## 2022-07-29 LAB — MAGNESIUM: Magnesium: 2 mg/dL (ref 1.7–2.4)

## 2022-07-29 MED ORDER — METOPROLOL TARTRATE 25 MG PO TABS
25.0000 mg | ORAL_TABLET | Freq: Four times a day (QID) | ORAL | Status: DC
  Administered 2022-07-29 – 2022-08-01 (×12): 25 mg via ORAL
  Filled 2022-07-29 (×12): qty 1

## 2022-07-29 MED ORDER — ROSUVASTATIN CALCIUM 20 MG PO TABS
40.0000 mg | ORAL_TABLET | Freq: Every day | ORAL | Status: DC
  Administered 2022-07-29 – 2022-08-02 (×5): 40 mg via ORAL
  Filled 2022-07-29 (×6): qty 2

## 2022-07-29 NOTE — Progress Notes (Signed)
ANTICOAGULATION CONSULT NOTE - Follow Up  Pharmacy Consult for heparin Indication: atrial fibrillation  Allergies  Allergen Reactions   Janumet [Sitagliptin-Metformin Hcl] Shortness Of Breath   Januvia [Sitagliptin] Shortness Of Breath   Kombiglyze [Saxagliptin-Metformin Er] Shortness Of Breath   Elemental Sulfur Itching    Patient Measurements: Height: 5\' 2"  (157.5 cm) Weight: 86.6 kg (190 lb 14.4 oz) IBW/kg (Calculated) : 50.1 Heparin Dosing Weight: 75kg  Vital Signs: Temp: 97.6 F (36.4 C) (12/27 0436) Temp Source: Oral (12/27 0436) BP: 137/78 (12/27 0436) Pulse Rate: 93 (12/27 0436)  Labs: Recent Labs    07/27/22 0031 07/27/22 0231 07/27/22 1253 07/28/22 0108 07/28/22 0823 07/28/22 1648 07/29/22 0234  HGB 13.7  --   --  12.6  --   --  13.0  HCT 41.6  --   --  39.9  --   --  39.2  PLT 187  --   --  152  --   --  152  HEPARINUNFRC  --   --    < >  --  0.70 0.45 0.54  CREATININE 1.32*  --   --  0.80  --   --   --   TROPONINIHS 6 7  --   --   --   --   --    < > = values in this interval not displayed.     Estimated Creatinine Clearance: 62.1 mL/min (by C-G formula based on SCr of 0.8 mg/dL).   Medical History: Past Medical History:  Diagnosis Date   Asthma    Chest pressure 07/13/2012   Diabetes mellitus without complication (HCC)    DM (diabetes mellitus) (HCC) 07/13/2012   HTN (hypertension) 07/13/2012   Hypercholesterolemia    Hypertension    SOB (shortness of breath) 07/13/2012   Tachycardia, unspecified, "fluttering" 07/13/2012    Assessment: 76yo female c/o strange sensation in LUE, found by EMS to be in Afb w/ RVR, given diltiazem bolus and now to start heparin.  Heparin level is therapeutic at 0.54, CBC is wnl, no issues with infusion or bleeding noted.  Goal of Therapy:  Heparin level 0.3-0.7 units/ml Monitor platelets by anticoagulation protocol: Yes   Plan:  Continue heparin at 800 units/hr. Monitor heparin levels and CBC  daily F/u transition to Eliquis   14/06/2012, PharmD Pharmacy Resident  07/29/2022 7:35 AM

## 2022-07-29 NOTE — Progress Notes (Addendum)
STROKE TEAM PROGRESS NOTE   ATTENDING NOTE: I reviewed above note and agree with the assessment and plan. Pt was seen and examined.   76 year old female with history of hypertension, diabetes, obesity admitted for left arm numbness and clumsiness.  Found to have A-fib RVR, currently on metoprolol and heparin IV.  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  CTA head and neck unremarkable.  EF 30 to 35%, down from 55 to 60% in 2014.  LDL 78, A1c pending.  Creatinine 0.8, WBC 3.9-3.2.  On exam, patient is to have left fingertip numbness but otherwise neurologically intact.  Etiology for patient's stroke likely due to newly diagnosed A-fib.  Currently on heparin IV and tolerating well.  Agree with anticoagulation and recommend to transition to p.o. anticoagulation once appropriate per cardiology.  Increase Crestor from 20-40.  Continue rate control per cardiology.  For detailed assessment and plan, please refer to above/below as I have made changes wherever appropriate.   Neurology will sign off. Please call with questions. Pt will follow up with stroke clinic NP at [facility] in about 4 weeks. Thanks for the consult.   Rosalin Hawking, MD PhD Stroke Neurology 07/29/2022 10:03 PM    INTERVAL HISTORY Patient was evaluated at bedside. Her only complaint was transient "clumsiness" of the L hand. She has no focal neurologic deficits.  Vitals:   07/29/22 0200 07/29/22 0300 07/29/22 0400 07/29/22 0436  BP:    137/78  Pulse:    93  Resp: 15 17 15 12   Temp:    97.6 F (36.4 C)  TempSrc:    Oral  SpO2:    96%  Weight:      Height:       CBC:  Recent Labs  Lab 07/27/22 0031 07/28/22 0108 07/29/22 0234  WBC 4.9 3.9* 3.2*  NEUTROABS 3.5  --   --   HGB 13.7 12.6 13.0  HCT 41.6 39.9 39.2  MCV 88.9 90.9 87.9  PLT 187 152 0000000   Basic Metabolic Panel:  Recent Labs  Lab 07/27/22 0031 07/28/22 0108  07/29/22 0234  NA 143 145  --   K 4.6 3.4*  --   CL 112* 115*  --   CO2 21* 22  --   GLUCOSE 164* 78  --   BUN 20 11  --   CREATININE 1.32* 0.80  --   CALCIUM 9.0 8.9  --   MG  --   --  2.0   Lipid Panel:  Recent Labs  Lab 07/29/22 0234  CHOL 144  TRIG 55  HDL 55  CHOLHDL 2.6  VLDL 11  LDLCALC 78   HgbA1c: No results for input(s): "HGBA1C" in the last 168 hours. Urine Drug Screen: No results for input(s): "LABOPIA", "COCAINSCRNUR", "LABBENZ", "AMPHETMU", "THCU", "LABBARB" in the last 168 hours.  Alcohol Level No results for input(s): "ETH" in the last 168 hours.  IMAGING past 24 hours CT ANGIO HEAD NECK W WO CM  Result Date: 07/28/2022 CLINICAL DATA:  Follow-up examination for stroke, determine embolic source. EXAM: CT ANGIOGRAPHY HEAD AND NECK TECHNIQUE: Multidetector CT imaging of the head and neck was performed using the standard protocol during bolus administration of intravenous contrast. Multiplanar CT image reconstructions and MIPs were obtained to evaluate the vascular anatomy. Carotid stenosis measurements (when applicable) are obtained utilizing NASCET criteria, using the distal internal carotid diameter as the denominator. RADIATION DOSE REDUCTION: This exam was performed according to  the departmental dose-optimization program which includes automated exposure control, adjustment of the mA and/or kV according to patient size and/or use of iterative reconstruction technique. CONTRAST:  57mL OMNIPAQUE IOHEXOL 350 MG/ML SOLN COMPARISON:  Prior brain MRI from earlier the same day. FINDINGS: CT HEAD FINDINGS Brain: Previously identified right MCA distribution infarct involving posterior right frontoparietal junction again seen, stable in size from prior MRI. Associated petechial blood products noted, not significantly changed. No frank hemorrhagic transformation or significant regional mass effect. No other large vessel territory infarct. Underlying mild chronic small vessel  ischemic disease. Chronic left parietal infarct noted. No acute intracranial hemorrhage. No mass lesion or midline shift. No hydrocephalus or extra-axial fluid collection. Vascular: No abnormal hyperdense vessel. Skull: Scalp soft tissues and calvarium within normal limits. Sinuses/Orbits: Globes orbital soft tissues within normal limits. Paranasal sinuses are largely clear. No mastoid effusion. Other: None. Review of the MIP images confirms the above findings CTA NECK FINDINGS Aortic arch: Visualized aortic arch normal in caliber with standard 3 vessel morphology. No stenosis about the origin the great vessels. Right carotid system: Right common and internal carotid arteries are patent without dissection. No hemodynamically significant stenosis about the right carotid artery system. Left carotid system: Left common and internal carotid arteries are patent without dissection. No hemodynamically significant stenosis about the left carotid artery system. Vertebral arteries: Both vertebral arteries arise from subclavian arteries. No proximal subclavian artery stenosis. Vertebral arteries patent without stenosis or dissection. Skeleton: No discrete or worrisome osseous lesions. Moderate spondylosis present at C4-5 and C5-6. Moderate right upper cervical facet arthropathy. Other neck: No other acute soft tissue abnormality within the neck. Upper chest: Visualized upper chest demonstrates no acute finding. Review of the MIP images confirms the above findings CTA HEAD FINDINGS Anterior circulation: Mild atheromatous change within the carotid siphons without hemodynamically significant stenosis. A1 segments patent bilaterally. Normal anterior communicating artery complex. Anterior cerebral arteries patent without stenosis. No M1 stenosis or occlusion. No proximal MCA branch occlusion or high-grade stenosis. Distal MCA branches perfused and symmetric. Posterior circulation: Both V4 segments patent without stenosis. Neither  PICA origin well visualized. Basilar widely patent to its distal aspect without stenosis. Superior cerebellar and posterior cerebral arteries patent bilaterally. Venous sinuses: Patent allowing for timing the contrast bolus. Anatomic variants: None significant.  No aneurysm. Review of the MIP images confirms the above findings IMPRESSION: CT HEAD IMPRESSION: 1. Stable size of evolving right MCA distribution infarct. Associated petechial blood products without frank hemorrhagic transformation or significant regional mass effect. 2. No other new acute intracranial abnormality. 3. Underlying mild chronic small vessel ischemic disease with chronic left parietal infarct. CTA HEAD AND NECK: 1. Negative CTA for large vessel occlusion or other emergent finding. 2. Mild atheromatous change about the carotid siphons without hemodynamically significant stenosis. 3. No other hemodynamically significant or correctable stenosis about the major arterial vasculature of the head and neck. Electronically Signed   By: Jeannine Boga M.D.   On: 07/28/2022 20:18   MR BRAIN WO CONTRAST  Result Date: 07/28/2022 CLINICAL DATA:  Neuro deficit, acute, stroke suspected. EXAM: MRI HEAD WITHOUT CONTRAST TECHNIQUE: Multiplanar, multiecho pulse sequences of the brain and surrounding structures were obtained without intravenous contrast. COMPARISON:  None Available. FINDINGS: Brain: Minimal chronic small-vessel ischemic change of the pons. No focal cerebellar finding. Cerebral hemispheres show chronic small-vessel ischemic changes with an old left parietal cortical and subcortical infarction. There is acute infarction in the right frontoparietal junction region with edema and some petechial bleeding.  No clear hematoma. The possibility of an underlying mass lesion is considered but is felt unlikely. No evidence of hydrocephalus or extra-axial collection. Vascular: Major vessels at the base of the brain show flow. Skull and upper cervical  spine: Negative Sinuses/Orbits: Clear/normal Other: None IMPRESSION: 1. 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 The infarction measures approximately 3 cm in size. The possibility of an underlying mass lesion is considered but is felt unlikely. 2. Chronic small-vessel ischemic changes elsewhere throughout the brain as outlined above. 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 Electronically Signed   By: Nelson Chimes M.D.   On: 07/28/2022 12:30    PHYSICAL EXAM General: well-appearing and in no acute distress HEENT: normocephalic and atraumatic Cardiovascular: tachycardic Respiratory: CTAB anteriorly and normal respiratory effort Gastrointestinal: non-tender and non-distended Extremities: moving all extremities spontaneously  Mental Status: Yury E Dax is alert; she is oriented to person, place, time, and situation. Speech was clear and fluent without evidence of aphasia. She was able to follow 3 step commands without difficulty.  Cranial Nerves: II:  Visual fields grossly normal III,IV, VI: no ptosis, extra-ocular motions intact bilaterally V,VII: smile symmetric, facial light touch sensation intact bilaterally VIII: hearing normal, symmetric bilaterally XII: midline tongue extension without atrophy and without fasciculations  Motor: Right : Upper extremity   5/5 full power  Lower extremity   5/5 full power  Left: Upper extremity   5/5 full power Lower extremity   5/5 full power  Tone and bulk: normal tone throughout; no atrophy noted  Cerebellar: Finger-to-nose test normal  Gait: not observed during encounter  ASSESSMENT/PLAN Sherlin E Unnerstall is a 76 y.o. female with history of hypertension, diabetes and obesity who presented on 12/25 with numbness and clumsiness of her left arm.  She noted that she was unable to hold certain objects and  endorsed numbness and tingling throughout the arm.  When patient arrived to the hospital, she was found to be in A-fib with RVR.  She was admitted to cardiology.  She is currently on metoprolol for rate control and heparin for anticoagulation.  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 Does not report any visual symptoms or headaches.  R frontoparietal junction infarct CTA head & neck shows negative CTA for large vessel occlusion or other emergent finding. Mild atheromatous change about the carotid siphons without hemodynamically significant stenosis. No other hemodynamically significant or correctable stenosis about the major arterial vasculature of the head and neck. MRI  shows acute infarction in the right frontoparietal junction region with edema and some petechial bleeding. The infarction measures approximately 3 cm in size. The possibility of an underlying mass lesion is considered but is felt unlikely. Chronic small-vessel ischemic changes elsewhere throughout the brain as outlined above. 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 2D Echo EF 30 - 35% LDL 78 HgbA1c 6.5 VTE prophylaxis - IV heparin    Diet   Diet 2 gram sodium Room service appropriate? Yes with Assist; Fluid consistency: Thin   aspirin 81 mg daily prior to admission, now on heparin IV. May switch to apixaban once cleared from cardiology standpoint Therapy recommendations:  pending evaluation Disposition:  pending therapy recs  Hypertension Home meds:  amlodipine Stable Permissive hypertension (OK if < 220/120) but gradually normalize in 5-7 days Long-term BP goal normotensive  Hyperlipidemia  Home meds:  rosuvastatin 20 mg, increased to 40 mg LDL 78, goal < 70 High intensity statin indicated. Continue statin at discharge  Diabetes type II Uncontrolled Home meds:  none HgbA1c 6.5, goal <  7.0 CBGs No results for input(s): "GLUCAP" in the last 72 hours.  SSI  Other Stroke Risk Factors Advanced Age >/= 51  Obesity, Body mass index is 34.92 kg/m., BMI >/= 30 associated with increased stroke risk, recommend weight loss, diet and exercise as appropriate  Congestive heart failure  Other Active Problems per primary team Afib, rate controlled HFpEF AKI  Hospital day # 0  To contact Stroke Continuity provider, please refer to [email or web address]. After hours, contact General Neurology

## 2022-07-29 NOTE — Progress Notes (Signed)
   Heart Failure Stewardship Pharmacist Progress Note   PCP: Dorothyann Peng, MD PCP-Cardiologist: None    HPI:  76 yo F with PMH of T2DM, obesity, and HTN.   Presented to the ED on 12/25 with L arm numbness. Found to be in afib RVR. CTA shows mild cardiomegaly w/ biatrial dilation, no aortic dissection, and no pleural effusion. BNP elevated. ECHO 12/25 showed LVEF 30-35% (previously 55-60%), global hypokinesis, and RV normal. Stroke team consulted to evaluate numbness, tingling, and lack of coordination. Brain MRI showed acute infract in right frontoparietal junction region with edema and some petechial bleeding. Possible underlying mass. Recommending permissive HTN x 48 hours from symptom onset, goal BP <220/110 but gradually normalize in 5-7 days.  Current HF Medications: Beta Blocker: metoprolol tartrate 25 mg BID  Prior to admission HF Medications: Diuretic: furosemide 20 mg daily  Pertinent Lab Values: No labs 12/27 Serum creatinine 0.80, BUN 11, Potassium 3.4, Sodium 145, Magnesium 2.0, BNP 562.3, A1c pending   Vital Signs: Weight: 190 lbs (admission weight: 193 lbs) Blood pressure: 130/80s  Heart rate: 80-120s in afib  I/O: not well documented   Medication Assistance / Insurance Benefits Check: Does the patient have prescription insurance?  Yes Type of insurance plan: Development worker, community Plan  Outpatient Pharmacy:  Prior to admission outpatient [facility] Is the patient willing to use [facility] TOC pharmacy at discharge? Yes Is the patient willing to transition their outpatient pharmacy to utilize a [facility] outpatient pharmacy?   Pending    Assessment: 1. Acute systolic CHF (LVEF 30-35%). NYHA class II symptoms. - Not volume overloaded on exam. Strict I/Os and daily weights. Keep K>4 and Mag>2.  - Continue metoprolol tartrate 25 mg BID - Further GDMT once cleared by neurology   Plan: 1) Medication changes recommended at this time: - BMET in AM  2) Patient  assistance: - Has federal employee insurance, can use monthly copay cards  3)  Education  - To be completed prior to discharge  Sharen Hones, PharmD, BCPS Heart Failure Engineer, building services Phone [phone]

## 2022-07-29 NOTE — Progress Notes (Signed)
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 With [possible underlying mass. Patient was started on cardizm drip , BP 135/97, HR 92, BNP 562, IV heparin started,   Patient was educated on the sign and symptoms of heart failure, daily weight, diet/ fluid restrictions, when to call her doctor or go to the ED, taking all medications as prescribed and attending all medical appointment. Patient verbalized her understanding, a Hf TOC was scheduled for 08/11/2022.   ECHO/ LVEF: 30-35%   Clinical Course:  Past Medical History:  Diagnosis Date   Asthma    Chest pressure 07/13/2012   Diabetes mellitus without complication (HCC)    DM (diabetes mellitus) (HCC) 07/13/2012   HTN (hypertension) 07/13/2012   Hypercholesterolemia    Hypertension    SOB (shortness of breath) 07/13/2012   Tachycardia, unspecified, "fluttering" 07/13/2012     Social History   Socioeconomic History   Marital status: Married    Spouse name: Not on file   Number of children: 2   Years of education: Not on file   Highest education level: Not on file  Occupational History   Occupation: retired  Tobacco Use   Smoking status: Former    Years: 2.00    Types: Cigarettes    Quit date: 05/13/1983    Years since quitting: 39.2   Smokeless tobacco: Never  Vaping Use   Vaping Use: Never used  Substance and Sexual Activity   Alcohol use: No   Drug use: No   Sexual activity: Yes  Other Topics Concern   Not on file  Social History Narrative   Not on file   Social Determinants of Health   Financial Resource Strain: Low Risk  (07/29/2022)   Overall Financial Resource Strain (CARDIA)     Difficulty of Paying Living Expenses: Not hard at all  Food Insecurity: No Food Insecurity (07/27/2022)   Hunger Vital Sign    Worried About Running Out of Food in the Last Year: Never true    Ran Out of Food in the Last Year: Never true  Transportation Needs: No Transportation Needs (07/27/2022)   PRAPARE - Administrator, Civil Service (Medical): No    Lack of Transportation (Non-Medical): No  Physical Activity: Inactive (04/23/2022)   Exercise Vital Sign    Days of Exercise per Week: 0 days    Minutes of Exercise per Session: 0 min  Stress: No Stress Concern Present (04/23/2022)   [employer] of Occupational Health - Occupational Stress Questionnaire    Feeling of Stress : Not at all  Social Connections: Not on file   Education Assessment and Provision:  Detailed education and instructions provided on heart failure disease management including the following:  Signs and symptoms of Heart Failure When to call the physician Importance of daily weights Low sodium diet Fluid restriction Medication management Anticipated future follow-up appointments  Patient education given on each of the above topics.  Patient acknowledges understanding via teach back method and acceptance of all instructions.  Education Materials:  "Living Better With Heart Failure" Booklet, HF zone tool, & Daily Weight Tracker Tool.  Patient has scale at home: yes Patient has pill box at home: NA  High Risk Criteria for Readmission and/or Poor Patient Outcomes: Heart failure hospital admissions (last 6 months): 0  No Show rate: 3% Difficult social situation: No Demonstrates medication adherence: Yes Primary Language: English Literacy level: Reading, writing, and comprehension  Barriers of Care:   Continued HF education Diet/ fluid/ daily weights  Considerations/Referrals:   Referral made to Heart Failure Pharmacist Stewardship: Yes Referral made to Heart Failure CSW/NCM TOC:  No Referral made to Heart & Vascular TOC clinic: Yes, 08/11/22  Items for Follow-up on DC/TOC: Continued HF education Diet/ fluids/ daily weights   Earnestine Leys, BSN, RN Heart Failure Transport planner Only

## 2022-07-29 NOTE — Progress Notes (Signed)
   07/29/22 1600  Mobility  Activity Ambulated independently in hallway  Level of Assistance Independent  Assistive Device None  Distance Ambulated (ft) 50 ft  Activity Response Tolerated well;Tolerated fair  Mobility Referral Yes  $Mobility charge 1 Mobility   Mobility Specialist Progress Note  Pre-Mobility: 98 HR During Mobility: 108-139 HR Post-Mobility: 109 HR  Pt was in bed and agreeable. Had no c/o pain. Returned to bed w/ all needs met and call bell in reach. RN notified.  Lucious Groves Mobility Specialist  Please contact via SecureChat or Rehab office at [phone]

## 2022-07-29 NOTE — Evaluation (Signed)
Physical Therapy Evaluation Patient Details Name: MANJOT BEUMER MRN: [id number] DOB: 1946-01-16 Today's Date: 07/29/2022  History of Present Illness  Patient is a 76 year old female presenting with numbness and clumsiness of her left arm. Found to be in A-fib with RVR. 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  Clinical Impression  Patient was agreeable to PT evaluation. She is usually independent with mobility at baseline without assistive device and lives with her spouse.  Today, the patient appears to be at or nearing her baseline level of functional mobility. She has mild LUE sensation and fine motor impairments that do not appear to impact functional independence with transfers, dynamic balance, ambulation, or standing activity. Heart rate 100-110's with activity with no shortness of breath noted with walking. Encouraged patient to continue using LUE during functional ADL tasks with outpatient OT recommended at discharge. No acute PT needs identified at this time as patient is Mod I with mobility.      Recommendations for follow up therapy are one component of a multi-disciplinary discharge planning process, led by the attending physician.  Recommendations may be updated based on patient status, additional functional criteria and insurance authorization.  Follow Up Recommendations No PT follow up (outpatient OT)      Assistance Recommended at Discharge PRN  Patient can return home with the following  Assist for transportation    Equipment Recommendations None recommended by PT  Recommendations for Other Services       Functional Status Assessment Patient has not had a recent decline in their functional status     Precautions / Restrictions Precautions Precautions: Fall Restrictions Weight Bearing Restrictions: No      Mobility  Bed Mobility               General bed mobility comments: not assessed as patient  sitting up on arrival and post session    Transfers Overall transfer level: Needs assistance Equipment used: None Transfers: Sit to/from Stand Sit to Stand: Modified independent (Device/Increase time)           General transfer comment: good safety awareness demonstrated    Ambulation/Gait Ambulation/Gait assistance: Modified independent (Device/Increase time) Gait Distance (Feet): 50 Feet Assistive device: None Gait Pattern/deviations: Decreased stance time - left Gait velocity: decreased     General Gait Details: mild decreased stance time on LLE (patient reports intermittent knee pain after bumping into furniture recently at home). otherwise WFL with no loss of balance without assistive device. heart rate 100-110's with activity  Stairs            Wheelchair Mobility    Modified Rankin (Stroke Patients Only)       Balance Overall balance assessment: Modified Independent                                           Pertinent Vitals/Pain Pain Assessment Pain Assessment: No/denies pain    Home Living Family/patient expects to be discharged to:: Private residence Living Arrangements: Spouse/significant other Available Help at Discharge: Family;Available PRN/intermittently Type of Home: House Home Access: Stairs to enter   [employer] of Steps: 3   Home Layout: One level Home Equipment: None      Prior Function Prior Level of Function : Independent/Modified Independent             Mobility Comments: ind ADLs Comments:  ind     Hand Dominance   Dominant Hand: Right    Extremity/Trunk Assessment   Upper Extremity Assessment Upper Extremity Assessment: LUE deficits/detail LUE Deficits / Details: active ROM grossly WFL, reports impaired sensation of finger tips and dropping objects LUE Sensation: decreased light touch (distally) LUE Coordination: decreased fine motor    Lower Extremity Assessment Lower Extremity  Assessment: Overall WFL for tasks assessed;RLE deficits/detail;LLE deficits/detail RLE Deficits / Details: dorsiflexion/plantarflexion 5/5, hip add/abd 5/5, knee extension 5/5 RLE Sensation: WNL RLE Coordination: WNL LLE Deficits / Details: dorsiflexion/plantarflexion 5/5, hip add/abd 5/5, knee extension 5/5, hip flexion 4+/5 LLE Sensation: WNL LLE Coordination: WNL    Cervical / Trunk Assessment Cervical / Trunk Assessment: Normal  Communication   Communication: No difficulties  Cognition Arousal/Alertness: Awake/alert Behavior During Therapy: WFL for tasks assessed/performed, Anxious Overall Cognitive Status: Within Functional Limits for tasks assessed                                          General Comments General comments (skin integrity, edema, etc.): HR up to 140-150 with mobility in room and standing ADL at sink    Exercises Other Exercises Other Exercises: encouraged active movement of LUE, including using with functional tasks and ADLs. reviewed and educated patient on signs/symptoms of stroke and encouraged patient to seek medical attention if experiencing these in the future once discharged.   Assessment/Plan    PT Assessment Patient does not need any further PT services  PT Problem List         PT Treatment Interventions      PT Goals (Current goals can be found in the Care Plan section)  Acute Rehab PT Goals PT Goal Formulation: All assessment and education complete, DC therapy    Frequency       Co-evaluation               AM-PAC PT "6 Clicks" Mobility  Outcome Measure Help needed turning from your back to your side while in a flat bed without using bedrails?: None Help needed moving from lying on your back to sitting on the side of a flat bed without using bedrails?: None Help needed moving to and from a bed to a chair (including a wheelchair)?: None Help needed standing up from a chair using your arms (e.g., wheelchair or  bedside chair)?: None Help needed to walk in hospital room?: None Help needed climbing 3-5 steps with a railing? : None 6 Click Score: 24    End of Session   Activity Tolerance: Patient tolerated treatment well Patient left:  (seated on bed per patient request)   PT Visit Diagnosis: Other symptoms and signs involving the nervous system (R29.898)    Time: 1113-1130 PT Time Calculation (min) (ACUTE ONLY): 17 min   Charges:   PT Evaluation $PT Eval Low Complexity: 1 Low PT Treatments $Therapeutic Activity: 8-22 mins        Minna Merritts, PT, MPT   Percell Locus 07/29/2022, 12:44 PM

## 2022-07-29 NOTE — Progress Notes (Addendum)
Rounding Note    Patient Name: Renee Bradley Date of Encounter: 07/29/2022  [place] Cardiologist:   Gwenlyn Found   Subjective   76 yo with new dx of atrial fib  Hx of diet controlled DM   Presented with numbness / tingling down her left arm  HR at presentation was 190   Stroke team was consulted yesterday for her L arm tingling  Brain MRI reveals an Acute infarction in the right frontoparietal junction region with edema and some petechial bleeding.   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  CTA of head / neck :  no significant carotid or vertebral disease   She has been cleared to be on heparin  Timing of eliquis initiation to be specified by the stroke rounding team  .  Still has limitations of her left hand / arm  Working with OT  Echo from 12/25 shows EF 35% Global hypokinesis     Inpatient Medications    Scheduled Meds:   stroke: early stages of recovery book   Does not apply Once   amLODipine  5 mg Oral Daily   metoprolol tartrate  25 mg Oral BID   rosuvastatin  40 mg Oral Daily   Continuous Infusions:  heparin 800 Units/hr (07/29/22 0536)   PRN Meds:    Vital Signs    Vitals:   07/29/22 0200 07/29/22 0300 07/29/22 0400 07/29/22 0436  BP:    137/78  Pulse:    93  Resp: 15 17 15 12   Temp:    97.6 F (36.4 C)  TempSrc:    Oral  SpO2:    96%  Weight:      Height:        Intake/Output Summary (Last 24 hours) at 07/29/2022 0846 Last data filed at 07/29/2022 0536 Gross per 24 hour  Intake 582.43 ml  Output --  Net 582.43 ml       07/29/2022   12:09 AM 07/28/2022    4:24 AM 07/27/2022    8:14 AM  Last 3 Weights  Weight (lbs) 190 lb 14.4 oz 191 lb 3.2 oz 193 lb  Weight (kg) 86.592 kg 86.728 kg 87.544 kg      Telemetry  Afib with controlled V response   ECG     - Personally Reviewed  Physical Exam   Physical Exam: Blood pressure 137/78,  pulse 93, temperature 97.6 F (36.4 C), temperature source Oral, resp. rate 12, height 5\' 2"  (1.575 m), weight 86.6 kg, SpO2 96 %.     GEN:  Well nourished, well developed in no acute distress HEENT: Normal NECK: No JVD; No carotid bruits LYMPHATICS: No lymphadenopathy CARDIAC: irreg. Irreg.  RESPIRATORY:  Clear to auscultation without rales, wheezing or rhonchi  ABDOMEN: Soft, non-tender, non-distended MUSCULOSKELETAL:  No edema; No deformity  SKIN: Warm and dry NEUROLOGIC:  left hand weakness,  lack of coordination      Labs    High Sensitivity Troponin:   Recent Labs  Lab 07/27/22 0031 07/27/22 0231  TROPONINIHS 6 7      Chemistry Recent Labs  Lab 07/27/22 0031 07/28/22 0108 07/29/22 0234  NA 143 145  --   K 4.6 3.4*  --   CL 112* 115*  --   CO2 21* 22  --   GLUCOSE 164* 78  --   BUN 20 11  --   CREATININE 1.32* 0.80  --   CALCIUM 9.0  8.9  --   MG  --   --  2.0  GFRNONAA 42* >60  --   ANIONGAP 10 8  --      Lipids  Recent Labs  Lab 07/29/22 0234  CHOL 144  TRIG 55  HDL 55  LDLCALC 78  CHOLHDL 2.6    Hematology Recent Labs  Lab 07/27/22 0031 07/28/22 0108 07/29/22 0234  WBC 4.9 3.9* 3.2*  RBC 4.68 4.39 4.46  HGB 13.7 12.6 13.0  HCT 41.6 39.9 39.2  MCV 88.9 90.9 87.9  MCH 29.3 28.7 29.1  MCHC 32.9 31.6 33.2  RDW 14.7 14.9 15.0  PLT 187 152 152    Thyroid No results for input(s): "TSH", "FREET4" in the last 168 hours.  BNP Recent Labs  Lab 07/27/22 0031  BNP 562.3*     DDimer No results for input(s): "DDIMER" in the last 168 hours.   Radiology    CT ANGIO HEAD NECK W WO CM  Result Date: 07/28/2022 CLINICAL DATA:  Follow-up examination for stroke, determine embolic source. EXAM: CT ANGIOGRAPHY HEAD AND NECK TECHNIQUE: Multidetector CT imaging of the head and neck was performed using the standard protocol during bolus administration of intravenous contrast. Multiplanar CT image reconstructions and MIPs were obtained to evaluate  the vascular anatomy. Carotid stenosis measurements (when applicable) are obtained utilizing NASCET criteria, using the distal internal carotid diameter as the denominator. RADIATION DOSE REDUCTION: This exam was performed according to the departmental dose-optimization program which includes automated exposure control, adjustment of the mA and/or kV according to patient size and/or use of iterative reconstruction technique. CONTRAST:  19mL OMNIPAQUE IOHEXOL 350 MG/ML SOLN COMPARISON:  Prior brain MRI from earlier the same day. FINDINGS: CT HEAD FINDINGS Brain: Previously identified right MCA distribution infarct involving posterior right frontoparietal junction again seen, stable in size from prior MRI. Associated petechial blood products noted, not significantly changed. No frank hemorrhagic transformation or significant regional mass effect. No other large vessel territory infarct. Underlying mild chronic small vessel ischemic disease. Chronic left parietal infarct noted. No acute intracranial hemorrhage. No mass lesion or midline shift. No hydrocephalus or extra-axial fluid collection. Vascular: No abnormal hyperdense vessel. Skull: Scalp soft tissues and calvarium within normal limits. Sinuses/Orbits: Globes orbital soft tissues within normal limits. Paranasal sinuses are largely clear. No mastoid effusion. Other: None. Review of the MIP images confirms the above findings CTA NECK FINDINGS Aortic arch: Visualized aortic arch normal in caliber with standard 3 vessel morphology. No stenosis about the origin the great vessels. Right carotid system: Right common and internal carotid arteries are patent without dissection. No hemodynamically significant stenosis about the right carotid artery system. Left carotid system: Left common and internal carotid arteries are patent without dissection. No hemodynamically significant stenosis about the left carotid artery system. Vertebral arteries: Both vertebral arteries  arise from subclavian arteries. No proximal subclavian artery stenosis. Vertebral arteries patent without stenosis or dissection. Skeleton: No discrete or worrisome osseous lesions. Moderate spondylosis present at C4-5 and C5-6. Moderate right upper cervical facet arthropathy. Other neck: No other acute soft tissue abnormality within the neck. Upper chest: Visualized upper chest demonstrates no acute finding. Review of the MIP images confirms the above findings CTA HEAD FINDINGS Anterior circulation: Mild atheromatous change within the carotid siphons without hemodynamically significant stenosis. A1 segments patent bilaterally. Normal anterior communicating artery complex. Anterior cerebral arteries patent without stenosis. No M1 stenosis or occlusion. No proximal MCA branch occlusion or high-grade stenosis. Distal MCA branches perfused and symmetric.  Posterior circulation: Both V4 segments patent without stenosis. Neither PICA origin well visualized. Basilar widely patent to its distal aspect without stenosis. Superior cerebellar and posterior cerebral arteries patent bilaterally. Venous sinuses: Patent allowing for timing the contrast bolus. Anatomic variants: None significant.  No aneurysm. Review of the MIP images confirms the above findings IMPRESSION: CT HEAD IMPRESSION: 1. Stable size of evolving right MCA distribution infarct. Associated petechial blood products without frank hemorrhagic transformation or significant regional mass effect. 2. No other new acute intracranial abnormality. 3. Underlying mild chronic small vessel ischemic disease with chronic left parietal infarct. CTA HEAD AND NECK: 1. Negative CTA for large vessel occlusion or other emergent finding. 2. Mild atheromatous change about the carotid siphons without hemodynamically significant stenosis. 3. No other hemodynamically significant or correctable stenosis about the major arterial vasculature of the head and neck. Electronically Signed    By: Jeannine Boga M.D.   On: 07/28/2022 20:18   MR BRAIN WO CONTRAST  Result Date: 07/28/2022 CLINICAL DATA:  Neuro deficit, acute, stroke suspected. EXAM: MRI HEAD WITHOUT CONTRAST TECHNIQUE: Multiplanar, multiecho pulse sequences of the brain and surrounding structures were obtained without intravenous contrast. COMPARISON:  None Available. FINDINGS: Brain: Minimal chronic small-vessel ischemic change of the pons. No focal cerebellar finding. Cerebral hemispheres show chronic small-vessel ischemic changes with an old left parietal cortical and subcortical infarction. There is acute infarction in the right frontoparietal junction region with edema and some petechial bleeding. No clear hematoma. The possibility of an underlying mass lesion is considered but is felt unlikely. No evidence of hydrocephalus or extra-axial collection. Vascular: Major vessels at the base of the brain show flow. Skull and upper cervical spine: Negative Sinuses/Orbits: Clear/normal Other: None IMPRESSION: 1. 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 The infarction measures approximately 3 cm in size. The possibility of an underlying mass lesion is considered but is felt unlikely. 2. Chronic small-vessel ischemic changes elsewhere throughout the brain as outlined above. 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 Electronically Signed   By: Nelson Chimes M.D.   On: 07/28/2022 12:30   ECHOCARDIOGRAM COMPLETE  Result Date: 07/27/2022    ECHOCARDIOGRAM REPORT   Patient Name:   REGINE GERMER Date of Exam: 07/27/2022 Medical Rec #:  [id number]       Height:       63.0 in Accession #:    [id number]      Weight:       195.0 lb Date of Birth:  1946-02-21       BSA:          1.913 m Patient Age:    86 years        BP:           113/79 mmHg Patient Gender: F               HR:           83 bpm. Exam  Location:  Inpatient Procedure: 2D Echo Indications:    atrial fibrillation  History:        Patient has prior history of Echocardiogram examinations, most                 recent 08/10/2012. Arrythmias:Tachycardia, Signs/Symptoms:Dyspnea                 and Edema; Risk Factors:Diabetes, Hypertension and Dyslipidemia.  Sonographer:    Johny Chess  RDCS Referring Phys: [id number] [facility]  Sonographer Comments: Image acquisition challenging due to respiratory motion. IMPRESSIONS  1. Left ventricular ejection fraction, by estimation, is 30 to 35%. The left ventricle has moderately decreased function. The left ventricle demonstrates global hypokinesis. Left ventricular diastolic function could not be evaluated.  2. Right ventricular systolic function is normal. The right ventricular size is normal. There is normal pulmonary artery systolic pressure.  3. Left atrial size was severely dilated.  4. Right atrial size was moderately dilated.  5. The mitral valve is normal in structure. Trivial mitral valve regurgitation. No evidence of mitral stenosis.  6. The aortic valve is tricuspid. Aortic valve regurgitation is not visualized. No aortic stenosis is present.  7. The inferior vena cava is normal in size with greater than 50% respiratory variability, suggesting right atrial pressure of 3 mmHg. FINDINGS  Left Ventricle: Left ventricular ejection fraction, by estimation, is 30 to 35%. The left ventricle has moderately decreased function. The left ventricle demonstrates global hypokinesis. The left ventricular internal cavity size was normal in size. There is no left ventricular hypertrophy. Left ventricular diastolic function could not be evaluated due to atrial fibrillation. Left ventricular diastolic function could not be evaluated. Right Ventricle: The right ventricular size is normal. No increase in right ventricular wall thickness. Right ventricular systolic function is normal. There is normal pulmonary artery  systolic pressure. The tricuspid regurgitant velocity is 2.59 m/s, and  with an assumed right atrial pressure of 3 mmHg, the estimated right ventricular systolic pressure is [id number] mmHg. Left Atrium: Left atrial size was severely dilated. Right Atrium: Right atrial size was moderately dilated. Pericardium: There is no evidence of pericardial effusion. Mitral Valve: The mitral valve is normal in structure. Trivial mitral valve regurgitation. No evidence of mitral valve stenosis. Tricuspid Valve: The tricuspid valve is normal in structure. Tricuspid valve regurgitation is mild . No evidence of tricuspid stenosis. Aortic Valve: The aortic valve is tricuspid. Aortic valve regurgitation is not visualized. No aortic stenosis is present. Pulmonic Valve: The pulmonic valve was normal in structure. Pulmonic valve regurgitation is not visualized. No evidence of pulmonic stenosis. Aorta: The aortic root is normal in size and structure. Venous: The inferior vena cava is normal in size with greater than 50% respiratory variability, suggesting right atrial pressure of 3 mmHg. IAS/Shunts: No atrial level shunt detected by color flow Doppler.  LEFT VENTRICLE PLAX 2D LVIDd:         5.10 cm LVIDs:         4.20 cm LV PW:         1.00 cm LV IVS:        0.90 cm LVOT diam:     1.80 cm LVOT Area:     2.54 cm  IVC IVC diam: 1.80 cm LEFT ATRIUM             Index        RIGHT ATRIUM           Index LA diam:        4.20 cm 2.20 cm/m   RA Area:     21.40 cm LA Vol (A2C):   79.5 ml 41.56 ml/m  RA Volume:   66.30 ml  34.66 ml/m LA Vol (A4C):   67.0 ml 35.02 ml/m LA Biplane Vol: 74.6 ml 39.00 ml/m   AORTA Ao Root diam: 2.80 cm TRICUSPID VALVE TR Peak grad:   26.8 mmHg TR Vmax:        259.00  cm/s  SHUNTS Systemic Diam: 1.80 cm Chilton Si MD Electronically signed by Chilton Si MD Signature Date/Time: 07/27/2022/11:10:33 AM    Final     Cardiac Studies     Patient Profile     76 y.o. female with new onset Afib,  persistant  left arm weakness, tingling    Assessment & Plan      Atrial fib :  HR is better controlled .   She is currently on IV heparin .   Had a new stroke this admission  Has been found to have a previous stroke  HR is well controlled.  Cont heparin,  transition to eliquis once we get the OK from the stroke team     2.  Chronic combined CHF:   EF on Dec. 25 was noted to be 30-35%.   Global hypokinesis  Troponins were negative .   Presenting HR was 190s.   This could be a tachycardia medicated hypokinesis which should improved with control of her LV function   Would be difficult to get a coronary CTA while in Afib with RVR.  I hesitate to hold anticoagulation for cath this soon after a stroke   She is not having any ischemia,  troponins were negative .   My plan is to continue  medications to control her HR . Repeat echo in 4-6 weeks to see if she has improved.  If there has been no improvement, she will need an ischemic evaluation .           For questions or updates, please contact [facility] Please consult [email or web address] for contact info under        Signed, Kristeen Miss, MD  07/29/2022, 8:46 AM

## 2022-07-29 NOTE — Evaluation (Signed)
Occupational Therapy Evaluation Patient Details Name: Renee Bradley MRN: [id number] DOB: July 20, 1946 Today's Date: 07/29/2022   History of Present Illness Pt is a 76 y/o F presenting to ED on 12/25 with decreased sensation in LUE, MRI revealing small infarct in R frontoparietal junction with petechial hemorrhage. Also found to be in new onset A fib. PMH includes DM, obesity, HTN, asthma, HTN, and hypercholesteremia.   Clinical Impression   Pt reports independence at baseline with ADLs and functional mobility, currently needing set up -min A for ADLs, and supervision for bed mobility. Pt presenting with decreased LUE (L hand ) coordination and sensation, dropping small items x2 during session during standing grooming task. Pt HR up to 140-150 with in room mobility, RN notified. Pt presenting with impairments listed below, will follow acutely. Recommend OP OT at d/c.      Recommendations for follow up therapy are one component of a multi-disciplinary discharge planning process, led by the attending physician.  Recommendations may be updated based on patient status, additional functional criteria and insurance authorization.   Follow Up Recommendations  Outpatient OT     Assistance Recommended at Discharge Set up Supervision/Assistance  Patient can return home with the following A little help with bathing/dressing/bathroom;A little help with walking and/or transfers;Assistance with cooking/housework;Direct supervision/assist for medications management;Help with stairs or ramp for entrance;Assist for transportation    Functional Status Assessment  Patient has had a recent decline in their functional status and demonstrates the ability to make significant improvements in function in a reasonable and predictable amount of time.  Equipment Recommendations  Tub/shower seat    Recommendations for Other Services PT consult     Precautions / Restrictions Precautions Precautions:  Fall Restrictions Weight Bearing Restrictions: No      Mobility Bed Mobility               General bed mobility comments: seated EOB upon arrival    Transfers Overall transfer level: Needs assistance Equipment used: None Transfers: Sit to/from Stand Sit to Stand: Supervision                  Balance Overall balance assessment: Mild deficits observed, not formally tested                                         ADL either performed or assessed with clinical judgement   ADL Overall ADL's : Needs assistance/impaired Eating/Feeding: Set up   Grooming: Set up   Upper Body Bathing: Minimal assistance   Lower Body Bathing: Minimal assistance   Upper Body Dressing : Minimal assistance   Lower Body Dressing: Minimal assistance   Toilet Transfer: Supervision/safety   Toileting- Clothing Manipulation and Hygiene: Supervision/safety       Functional mobility during ADLs: Supervision/safety       Vision Baseline Vision/History: 1 Wears glasses Vision Assessment?: No apparent visual deficits Additional Comments: reports no visual deficits, appearing WFL for basic ADL tasks     Perception Perception Perception Tested?: No   Praxis Praxis Praxis tested?: Not tested    Pertinent Vitals/Pain Pain Assessment Pain Assessment: No/denies pain     Hand Dominance Right   Extremity/Trunk Assessment Upper Extremity Assessment Upper Extremity Assessment: LUE deficits/detail LUE Deficits / Details: grossly WFL, decreased sensation in palm of hand/fingers, unable to descriniate between different textures with vision occluded, decreased coordination and undershoots with finger to nose test.  Reports dropping items LUE Sensation: decreased light touch;decreased proprioception LUE Coordination: decreased fine motor   Lower Extremity Assessment Lower Extremity Assessment: Defer to PT evaluation   Cervical / Trunk Assessment Cervical / Trunk  Assessment: Normal   Communication Communication Communication: No difficulties   Cognition Arousal/Alertness: Awake/alert Behavior During Therapy: WFL for tasks assessed/performed, Anxious Overall Cognitive Status: Impaired/Different from baseline Area of Impairment: Attention, Memory                   Current Attention Level: Focused Memory: Decreased short-term memory         General Comments: short term memory deficits, increased time needed for reverse sequencing task with min cues     General Comments  HR up to 140-150 with mobility in room and standing ADL at sink    Exercises Exercises: Other exercises Other Exercises Other Exercises: provided pt with fine motor coordination handout   Shoulder Instructions      Home Living Family/patient expects to be discharged to:: Private residence Living Arrangements: Spouse/significant other Available Help at Discharge: Family;Available PRN/intermittently (spouse works) Type of Home: House Home Access: Stairs to enter Secretary/administrator of Steps: 3   Home Layout: One level     Bathroom Shower/Tub: Runner, broadcasting/film/video: None          Prior Functioning/Environment Prior Level of Function : Independent/Modified Independent;Driving             Mobility Comments: ind ADLs Comments: ind        OT Problem List: Decreased strength;Decreased range of motion;Impaired sensation;Cardiopulmonary status limiting activity      OT Treatment/Interventions: Self-care/ADL training;Therapeutic exercise;Energy conservation;DME and/or AE instruction;Therapeutic activities;Patient/family education;Balance training;Cognitive remediation/compensation    OT Goals(Current goals can be found in the care plan section) Acute Rehab OT Goals Patient Stated Goal: none stated OT Goal Formulation: With patient Time For Goal Achievement: 08/12/22 Potential to Achieve Goals: Good ADL Goals Pt Will Perform  Upper Body Dressing: with modified independence;sitting Pt Will Perform Lower Body Dressing: with modified independence;sitting/lateral leans;sit to/from stand Pt/caregiver will Perform Home Exercise Program: Left upper extremity;With Supervision;With written HEP provided Additional ADL Goal #1: pt will receive passing score on pillbox assessment in order to promote ind with med mgmt  OT Frequency: Min 2X/week    Co-evaluation              AM-PAC OT "6 Clicks" Daily Activity     Outcome Measure Help from another person eating meals?: None Help from another person taking care of personal grooming?: A Little Help from another person toileting, which includes using toliet, bedpan, or urinal?: A Little Help from another person bathing (including washing, rinsing, drying)?: A Little Help from another person to put on and taking off regular upper body clothing?: A Little Help from another person to put on and taking off regular lower body clothing?: A Little 6 Click Score: 19   End of Session Nurse Communication: Mobility status  Activity Tolerance: Patient tolerated treatment well Patient left: in chair;with call bell/phone within reach  OT Visit Diagnosis: Unsteadiness on feet (R26.81);Other abnormalities of gait and mobility (R26.89);Muscle weakness (generalized) (M62.81)                Time: 6606-3016 OT Time Calculation (min): 35 min Charges:  OT General Charges $OT Visit: 1 Visit OT Evaluation $OT Eval Moderate Complexity: 1 Mod OT Treatments $Self Care/Home Management : 8-22 mins  Carver Fila,  OTD, OTR/L SecureChat Preferred Acute Rehab [phone]   Levada Bowersox K Koonce 07/29/2022, 10:10 AM

## 2022-07-29 NOTE — Progress Notes (Signed)
Patient displayed no deficits in assessment and states she is back to baseline with the exception of a mild headache.  1000mg  Tylenol given for headache.

## 2022-07-29 NOTE — Plan of Care (Signed)
  Problem: Health Behavior/Discharge Planning: Goal: Ability to manage health-related needs will improve Outcome: Adequate for Discharge   Problem: Clinical Measurements: Goal: Ability to maintain clinical measurements within normal limits will improve Outcome: Adequate for Discharge Goal: Will remain free from infection Outcome: Adequate for Discharge Goal: Diagnostic test results will improve Outcome: Adequate for Discharge Goal: Respiratory complications will improve Outcome: Adequate for Discharge Goal: Cardiovascular complication will be avoided Outcome: Adequate for Discharge   Problem: Activity: Goal: Risk for activity intolerance will decrease Outcome: Adequate for Discharge   Problem: Nutrition: Goal: Adequate nutrition will be maintained Outcome: Adequate for Discharge   Problem: Coping: Goal: Level of anxiety will decrease Outcome: Adequate for Discharge   Problem: Elimination: Goal: Will not experience complications related to bowel motility Outcome: Adequate for Discharge Goal: Will not experience complications related to urinary retention Outcome: Adequate for Discharge   Problem: Pain Managment: Goal: General experience of comfort will improve Outcome: Adequate for Discharge   Problem: Safety: Goal: Ability to remain free from injury will improve Outcome: Adequate for Discharge   Problem: Skin Integrity: Goal: Risk for impaired skin integrity will decrease Outcome: Adequate for Discharge   Problem: Education: Goal: Knowledge of disease or condition will improve Outcome: Adequate for Discharge Goal: Knowledge of secondary prevention will improve (MUST DOCUMENT ALL) Outcome: Adequate for Discharge Goal: Knowledge of patient specific risk factors will improve Loraine Leriche N/A or DELETE if not current risk factor) Outcome: Adequate for Discharge   Problem: Ischemic Stroke/TIA Tissue Perfusion: Goal: Complications of ischemic stroke/TIA will be  minimized Outcome: Adequate for Discharge   Problem: Coping: Goal: Will verbalize positive feelings about self Outcome: Adequate for Discharge Goal: Will identify appropriate support needs Outcome: Adequate for Discharge   Problem: Health Behavior/Discharge Planning: Goal: Ability to manage health-related needs will improve Outcome: Adequate for Discharge Goal: Goals will be collaboratively established with patient/family Outcome: Adequate for Discharge   Problem: Self-Care: Goal: Ability to participate in self-care as condition permits will improve Outcome: Adequate for Discharge Goal: Verbalization of feelings and concerns over difficulty with self-care will improve Outcome: Adequate for Discharge Goal: Ability to communicate needs accurately will improve Outcome: Adequate for Discharge   Problem: Nutrition: Goal: Risk of aspiration will decrease Outcome: Adequate for Discharge Goal: Dietary intake will improve Outcome: Adequate for Discharge   Problem: Education: Goal: Knowledge of disease or condition will improve Outcome: Adequate for Discharge Goal: Understanding of medication regimen will improve Outcome: Adequate for Discharge Goal: Individualized Educational Video(s) Outcome: Adequate for Discharge   Problem: Activity: Goal: Ability to tolerate increased activity will improve Outcome: Adequate for Discharge   Problem: Cardiac: Goal: Ability to achieve and maintain adequate cardiopulmonary perfusion will improve Outcome: Adequate for Discharge   Problem: Health Behavior/Discharge Planning: Goal: Ability to safely manage health-related needs after discharge will improve Outcome: Adequate for Discharge

## 2022-07-30 DIAGNOSIS — I5043 Acute on chronic combined systolic (congestive) and diastolic (congestive) heart failure: Secondary | ICD-10-CM

## 2022-07-30 DIAGNOSIS — I63511 Cerebral infarction due to unspecified occlusion or stenosis of right middle cerebral artery: Secondary | ICD-10-CM | POA: Diagnosis not present

## 2022-07-30 DIAGNOSIS — I4891 Unspecified atrial fibrillation: Secondary | ICD-10-CM | POA: Diagnosis not present

## 2022-07-30 LAB — BASIC METABOLIC PANEL
Anion gap: 9 (ref 5–15)
BUN: 8 mg/dL (ref 8–23)
CO2: 21 mmol/L — ABNORMAL LOW (ref 22–32)
Calcium: 8.9 mg/dL (ref 8.9–10.3)
Chloride: 114 mmol/L — ABNORMAL HIGH (ref 98–111)
Creatinine, Ser: 0.87 mg/dL (ref 0.44–1.00)
GFR, Estimated: >60 mL/min (ref >60)
Glucose, Bld: 62 mg/dL — ABNORMAL LOW (ref 70–99)
Potassium: 3.9 mmol/L (ref 3.5–5.1)
Sodium: 144 mmol/L (ref 135–145)

## 2022-07-30 LAB — CBC
HCT: 39.5 % (ref 36.0–46.0)
Hemoglobin: 13 g/dL (ref 12.0–15.0)
MCH: 29.3 pg (ref 26.0–34.0)
MCHC: 32.9 g/dL (ref 30.0–36.0)
MCV: 89 fL (ref 80.0–100.0)
Platelets: 148 10*3/uL — ABNORMAL LOW (ref 150–400)
RBC: 4.44 MIL/uL (ref 3.87–5.11)
RDW: 14.8 % (ref 11.5–15.5)
WBC: 4 10*3/uL (ref 4.0–10.5)
nRBC: 0 % (ref 0.0–0.2)

## 2022-07-30 LAB — HEPARIN LEVEL (UNFRACTIONATED): Heparin Unfractionated: 0.38 IU/mL (ref 0.30–0.70)

## 2022-07-30 LAB — HEMOGLOBIN A1C
Hgb A1c MFr Bld: 6.5 % — ABNORMAL HIGH (ref 4.8–5.6)
Mean Plasma Glucose: 140 mg/dL

## 2022-07-30 LAB — TSH: TSH: 1.115 u[IU]/mL (ref 0.350–4.500)

## 2022-07-30 MED ORDER — EMPAGLIFLOZIN 10 MG PO TABS
10.0000 mg | ORAL_TABLET | Freq: Every day | ORAL | Status: DC
  Administered 2022-07-30 – 2022-07-31 (×2): 10 mg via ORAL
  Filled 2022-07-30 (×2): qty 1

## 2022-07-30 MED ORDER — SACUBITRIL-VALSARTAN 24-26 MG PO TABS
1.0000 | ORAL_TABLET | Freq: Two times a day (BID) | ORAL | Status: DC
  Administered 2022-07-30 – 2022-08-02 (×7): 1 via ORAL
  Filled 2022-07-30 (×7): qty 1

## 2022-07-30 MED ORDER — APIXABAN 5 MG PO TABS
5.0000 mg | ORAL_TABLET | Freq: Two times a day (BID) | ORAL | Status: DC
  Administered 2022-07-30 – 2022-08-02 (×7): 5 mg via ORAL
  Filled 2022-07-30 (×7): qty 1

## 2022-07-30 NOTE — Care Management Obs Status (Signed)
MEDICARE OBSERVATION STATUS NOTIFICATION   Patient Details  Name: QUINTELLA MURA MRN: [id number] Date of Birth: October 22, 1945   Medicare Observation Status Notification Given:  Yes    Kermit Balo, RN 07/30/2022, 11:38 AM

## 2022-07-30 NOTE — Plan of Care (Signed)
  Problem: Health Behavior/Discharge Planning: Goal: Ability to manage health-related needs will improve Outcome: Progressing   Problem: Clinical Measurements: Goal: Ability to maintain clinical measurements within normal limits will improve Outcome: Progressing Goal: Will remain free from infection Outcome: Progressing Goal: Diagnostic test results will improve Outcome: Progressing Goal: Respiratory complications will improve Outcome: Progressing Goal: Cardiovascular complication will be avoided Outcome: Progressing   Problem: Activity: Goal: Risk for activity intolerance will decrease Outcome: Progressing   Problem: Nutrition: Goal: Adequate nutrition will be maintained Outcome: Progressing   Problem: Coping: Goal: Level of anxiety will decrease Outcome: Progressing   Problem: Elimination: Goal: Will not experience complications related to bowel motility Outcome: Progressing Goal: Will not experience complications related to urinary retention Outcome: Progressing   Problem: Pain Managment: Goal: General experience of comfort will improve Outcome: Progressing   Problem: Safety: Goal: Ability to remain free from injury will improve Outcome: Progressing   Problem: Skin Integrity: Goal: Risk for impaired skin integrity will decrease Outcome: Progressing   Problem: Education: Goal: Knowledge of disease or condition will improve Outcome: Progressing Goal: Knowledge of secondary prevention will improve (MUST DOCUMENT ALL) Outcome: Progressing Goal: Knowledge of patient specific risk factors will improve (Mark N/A or DELETE if not current risk factor) Outcome: Progressing   Problem: Ischemic Stroke/TIA Tissue Perfusion: Goal: Complications of ischemic stroke/TIA will be minimized Outcome: Progressing   Problem: Coping: Goal: Will verbalize positive feelings about self Outcome: Progressing Goal: Will identify appropriate support needs Outcome: Progressing    Problem: Health Behavior/Discharge Planning: Goal: Ability to manage health-related needs will improve Outcome: Progressing Goal: Goals will be collaboratively established with patient/family Outcome: Progressing   Problem: Self-Care: Goal: Ability to participate in self-care as condition permits will improve Outcome: Progressing Goal: Verbalization of feelings and concerns over difficulty with self-care will improve Outcome: Progressing Goal: Ability to communicate needs accurately will improve Outcome: Progressing   Problem: Nutrition: Goal: Risk of aspiration will decrease Outcome: Progressing Goal: Dietary intake will improve Outcome: Progressing   Problem: Education: Goal: Knowledge of disease or condition will improve Outcome: Progressing Goal: Understanding of medication regimen will improve Outcome: Progressing Goal: Individualized Educational Video(s) Outcome: Progressing   Problem: Activity: Goal: Ability to tolerate increased activity will improve Outcome: Progressing   Problem: Cardiac: Goal: Ability to achieve and maintain adequate cardiopulmonary perfusion will improve Outcome: Progressing   Problem: Health Behavior/Discharge Planning: Goal: Ability to safely manage health-related needs after discharge will improve Outcome: Progressing   

## 2022-07-30 NOTE — Progress Notes (Addendum)
   Heart Failure Stewardship Pharmacist Progress Note   PCP: Dorothyann Peng, MD PCP-Cardiologist: None    HPI:  76 yo F with PMH of T2DM, obesity, and HTN.   Presented to the ED on 12/25 with L arm numbness. Found to be in afib RVR. CTA shows mild cardiomegaly w/ biatrial dilation, no aortic dissection, and no pleural effusion. BNP elevated. ECHO 12/25 showed LVEF 30-35% (previously 55-60%), global hypokinesis, and RV normal. Stroke team consulted to evaluate numbness, tingling, and lack of coordination. Brain MRI showed acute infract in right frontoparietal junction region with edema and some petechial bleeding. Possible underlying mass. Recommending permissive HTN x 48 hours from symptom onset, goal BP <220/110 but gradually normalize in 5-7 days.  Current HF Medications: Beta Blocker: metoprolol tartrate 25 mg q6h ACE/ARB/ARNI: Entresto 24/26 mg BID SGLT2i: Jardiance 10 mg daily  Prior to admission HF Medications: Diuretic: furosemide 20 mg daily  Pertinent Lab Values: No labs 12/27 Serum creatinine 0.80, BUN 11, Potassium 3.4, Sodium 145, Magnesium 2.0, BNP 562.3, A1c pending   Vital Signs: Weight: 189 lbs (admission weight: 193 lbs) Blood pressure: 140/90s  Heart rate: 80-90s in afib  I/O: not well documented   Medication Assistance / Insurance Benefits Check: Does the patient have prescription insurance?  Yes Type of insurance plan: Development worker, community Plan  Outpatient Pharmacy:  [facility] willing [facility] at discharge? Yes Is the patient willing to transition their outpatient pharmacy to utilize a [facility] outpatient pharmacy?   Pending    Assessment: 1. Acute systolic CHF (LVEF 30-35%). Ischemic evaluation after repeat ECHO as outpatient. NYHA class II symptoms. - Not volume overloaded on exam. Strict I/Os and daily weights. Keep K>4 and Mag>2.  - Continue metoprolol tartrate 25 mg q6h, consider  consolidating to metoprolol XL prior to discharge - Agree with starting Entresto 24/26 mg BID  - Agree with starting Jardiance 10 mg daily   Plan: 1) Medication changes recommended at this time: - Agree with changes  2) Patient assistance: - Has federal employee insurance, can use monthly copay cards - Provided copay card for Entresto and Jardiance  3)  Education  - Patient has been educated on current HF medications and potential additions to HF medication regimen - Patient verbalizes understanding that over the next few months, these medication doses may change and more medications may be added to optimize HF regimen - Patient has been educated on basic disease state pathophysiology and goals of therapy   Sharen Hones, PharmD, BCPS Heart Failure Engineer, building services Phone [phone]

## 2022-07-30 NOTE — TOC Initial Note (Signed)
Transition of Care [facility]) - Initial/Assessment Note    Patient Details  Name: Renee Bradley MRN: [id number] Date of Birth: [birth year]  [facility] CM/SW Contact:    Pollie Friar, RN Phone Number: 07/30/2022, 11:52 AM  Clinical Narrative:                 Pt is from home with her spouse. She states he works during the daytime but home in evenings and nights. He will be home over the coming weekend and holiday.  Spouse has been driving due to her car being in shop, but normally she drives self as needed.  She denies any issues with her home medications.  Recommendations for outpatient therapy and pt agreeable. She asked to attend at [facility]. Information on AVS. Pt will need to call and schedule. She is aware.  TOC following for further d/c needs.   Expected Discharge Plan: OP Rehab Barriers to Discharge: Continued Medical Work up   Patient Goals and CMS Choice     Choice offered to / list presented to : Patient      Expected Discharge Plan and Services   Discharge Planning Services: CM Consult   Living arrangements for the past 2 months: Single Family Home                                      Prior Living Arrangements/Services Living arrangements for the past 2 months: Single Family Home Lives with:: Spouse Patient language and need for interpreter reviewed:: Yes Do you feel safe going back to the place where you live?: Yes            Criminal Activity/Legal Involvement Pertinent to Current Situation/Hospitalization: No - Comment as needed  Activities of Daily Living Home Assistive Devices/Equipment: None ADL Screening (condition at time of admission) Patient's cognitive ability adequate to safely complete daily activities?: No Is the patient deaf or have difficulty hearing?: No Does the patient have difficulty seeing, even when wearing glasses/contacts?: No Does the patient have difficulty concentrating, remembering, or making  decisions?: No Patient able to express need for assistance with ADLs?: No Does the patient have difficulty dressing or bathing?: No Independently performs ADLs?: No Does the patient have difficulty walking or climbing stairs?: No Weakness of Legs: None Weakness of Arms/Hands: None  Permission Sought/Granted                  Emotional Assessment Appearance:: Appears stated age Attitude/Demeanor/Rapport: Engaged Affect (typically observed): Accepting Orientation: : Oriented to Self, Oriented to Place, Oriented to  Time, Oriented to Situation   Psych Involvement: No (comment)  Admission diagnosis:  Atrial fibrillation with RVR (HCC) [I48.91] Patient Active Problem List   Diagnosis Date Noted   Atrial fibrillation with RVR ([place]) 07/27/2022   Constipation 12/02/2021   Estrogen deficiency 12/02/2021   Class 2 severe obesity due to excess calories with serious comorbidity and body mass index (BMI) of 37.0 to 37.9 in adult ([place]) 12/02/2021   Localized swelling of both lower legs 10/24/2020   Chronic renal disease, 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   Gastroesophageal reflux disease without esophagitis 07/13/2018   Chest pressure 07/13/2012   SOB (shortness of breath) 07/13/2012   DM (diabetes mellitus) ([place]) 07/13/2012   HTN (hypertension) 07/13/2012   Hyperlipidemia 07/13/2012   Tachycardia, unspecified, "fluttering" 07/13/2012   PCP:  Glendale Chard, MD Pharmacy:  [facility] [place], [place] - [address] [facility] AT Portal to Registered [facility] [address] [place] [address] Phone: [phone] Fax: [phone]  [id number] [place], [place] - [address] AT [facility] [address] [place] [address] Phone: [phone] Fax: [phone]  [facility] [address] [place] [address] Phone: [phone] Fax:  [phone]     Social Determinants of Health (SDOH) Social History: SDOH Screenings   Food Insecurity: No Food Insecurity (07/27/2022)  Housing: Low Risk  (07/27/2022)  Transportation Needs: No Transportation Needs (07/27/2022)  Utilities: Not At Risk (07/27/2022)  Alcohol Screen: Low Risk  (07/29/2022)  Depression (PHQ2-9): Low Risk  (04/23/2022)  Financial Resource Strain: Low Risk  (07/29/2022)  Physical Activity: Inactive (04/23/2022)  Stress: No Stress Concern Present (04/23/2022)  Tobacco Use: Medium Risk (07/29/2022)   SDOH Interventions: Housing Interventions: Intervention Not Indicated Alcohol Usage Interventions: Intervention Not Indicated (Score <7) Financial Strain Interventions: Intervention Not Indicated   Readmission Risk Interventions     No data to display

## 2022-07-30 NOTE — Plan of Care (Signed)
Problem: Health Behavior/Discharge Planning: Goal: Ability to manage health-related needs will improve 07/30/2022 1526 by Louanne Belton, RN Outcome: Progressing 07/30/2022 1417 by Louanne Belton, RN Outcome: Progressing   Problem: Clinical Measurements: Goal: Ability to maintain clinical measurements within normal limits will improve 07/30/2022 1526 by Louanne Belton, RN Outcome: Progressing 07/30/2022 1417 by Louanne Belton, RN Outcome: Progressing Goal: Will remain free from infection 07/30/2022 1526 by Louanne Belton, RN Outcome: Progressing 07/30/2022 1417 by Louanne Belton, RN Outcome: Progressing Goal: Diagnostic test results will improve 07/30/2022 1526 by Louanne Belton, RN Outcome: Progressing 07/30/2022 1417 by Louanne Belton, RN Outcome: Progressing Goal: Respiratory complications will improve 07/30/2022 1526 by Louanne Belton, RN Outcome: Progressing 07/30/2022 1417 by Louanne Belton, RN Outcome: Progressing Goal: Cardiovascular complication will be avoided 07/30/2022 1526 by Louanne Belton, RN Outcome: Progressing 07/30/2022 1417 by Louanne Belton, RN Outcome: Progressing   Problem: Activity: Goal: Risk for activity intolerance will decrease 07/30/2022 1526 by Louanne Belton, RN Outcome: Progressing 07/30/2022 1417 by Louanne Belton, RN Outcome: Progressing   Problem: Nutrition: Goal: Adequate nutrition will be maintained 07/30/2022 1526 by Louanne Belton, RN Outcome: Progressing 07/30/2022 1417 by Louanne Belton, RN Outcome: Progressing   Problem: Coping: Goal: Level of anxiety will decrease 07/30/2022 1526 by Louanne Belton, RN Outcome: Progressing 07/30/2022 1417 by Louanne Belton, RN Outcome: Progressing   Problem: Elimination: Goal: Will not experience complications related to bowel motility 07/30/2022 1526 by Louanne Belton, RN Outcome: Progressing 07/30/2022 1417 by Louanne Belton, RN Outcome: Progressing Goal:  Will not experience complications related to urinary retention 07/30/2022 1526 by Louanne Belton, RN Outcome: Progressing 07/30/2022 1417 by Louanne Belton, RN Outcome: Progressing   Problem: Pain Managment: Goal: General experience of comfort will improve 07/30/2022 1526 by Louanne Belton, RN Outcome: Progressing 07/30/2022 1417 by Louanne Belton, RN Outcome: Progressing   Problem: Safety: Goal: Ability to remain free from injury will improve 07/30/2022 1526 by Louanne Belton, RN Outcome: Progressing 07/30/2022 1417 by Louanne Belton, RN Outcome: Progressing   Problem: Skin Integrity: Goal: Risk for impaired skin integrity will decrease 07/30/2022 1526 by Louanne Belton, RN Outcome: Progressing 07/30/2022 1417 by Louanne Belton, RN Outcome: Progressing   Problem: Education: Goal: Knowledge of disease or condition will improve 07/30/2022 1526 by Louanne Belton, RN Outcome: Progressing 07/30/2022 1417 by Louanne Belton, RN Outcome: Progressing Goal: Knowledge of secondary prevention will improve (MUST DOCUMENT ALL) 07/30/2022 1526 by Louanne Belton, RN Outcome: Progressing 07/30/2022 1417 by Louanne Belton, RN Outcome: Progressing Goal: Knowledge of patient specific risk factors will improve Loraine Leriche N/A or DELETE if not current risk factor) 07/30/2022 1526 by Louanne Belton, RN Outcome: Progressing 07/30/2022 1417 by Louanne Belton, RN Outcome: Progressing   Problem: Ischemic Stroke/TIA Tissue Perfusion: Goal: Complications of ischemic stroke/TIA will be minimized 07/30/2022 1526 by Louanne Belton, RN Outcome: Progressing 07/30/2022 1417 by Louanne Belton, RN Outcome: Progressing   Problem: Coping: Goal: Will verbalize positive feelings about self 07/30/2022 1526 by Louanne Belton, RN Outcome: Progressing 07/30/2022 1417 by Louanne Belton, RN Outcome: Progressing Goal: Will identify appropriate support needs 07/30/2022 1526 by Louanne Belton,  RN Outcome: Progressing 07/30/2022 1417 by Louanne Belton, RN Outcome: Progressing   Problem: Health Behavior/Discharge Planning: Goal: Ability to manage health-related needs will improve 07/30/2022 1526 by Louanne Belton, RN Outcome: Progressing 07/30/2022  1417 by Karlyn Agee, RN Outcome: Progressing Goal: Goals will be collaboratively established with patient/family 07/30/2022 1526 by Karlyn Agee, RN Outcome: Progressing 07/30/2022 1417 by Karlyn Agee, RN Outcome: Progressing   Problem: Self-Care: Goal: Ability to participate in self-care as condition permits will improve 07/30/2022 1526 by Karlyn Agee, RN Outcome: Progressing 07/30/2022 1417 by Karlyn Agee, RN Outcome: Progressing Goal: Verbalization of feelings and concerns over difficulty with self-care will improve 07/30/2022 1526 by Karlyn Agee, RN Outcome: Progressing 07/30/2022 1417 by Karlyn Agee, RN Outcome: Progressing Goal: Ability to communicate needs accurately will improve 07/30/2022 1526 by Karlyn Agee, RN Outcome: Progressing 07/30/2022 1417 by Karlyn Agee, RN Outcome: Progressing   Problem: Nutrition: Goal: Risk of aspiration will decrease 07/30/2022 1526 by Karlyn Agee, RN Outcome: Progressing 07/30/2022 1417 by Karlyn Agee, RN Outcome: Progressing Goal: Dietary intake will improve 07/30/2022 1526 by Karlyn Agee, RN Outcome: Progressing 07/30/2022 1417 by Karlyn Agee, RN Outcome: Progressing   Problem: Education: Goal: Knowledge of disease or condition will improve 07/30/2022 1526 by Karlyn Agee, RN Outcome: Progressing 07/30/2022 1417 by Karlyn Agee, RN Outcome: Progressing Goal: Understanding of medication regimen will improve 07/30/2022 1526 by Karlyn Agee, RN Outcome: Progressing 07/30/2022 1417 by Karlyn Agee, RN Outcome: Progressing Goal: Individualized Educational Video(s) 07/30/2022 1526 by Karlyn Agee,  RN Outcome: Progressing 07/30/2022 1417 by Karlyn Agee, RN Outcome: Progressing   Problem: Activity: Goal: Ability to tolerate increased activity will improve 07/30/2022 1526 by Karlyn Agee, RN Outcome: Progressing 07/30/2022 1417 by Karlyn Agee, RN Outcome: Progressing   Problem: Cardiac: Goal: Ability to achieve and maintain adequate cardiopulmonary perfusion will improve 07/30/2022 1526 by Karlyn Agee, RN Outcome: Progressing 07/30/2022 1417 by Karlyn Agee, RN Outcome: Progressing   Problem: Health Behavior/Discharge Planning: Goal: Ability to safely manage health-related needs after discharge will improve 07/30/2022 1526 by Karlyn Agee, RN Outcome: Progressing 07/30/2022 1417 by Karlyn Agee, RN Outcome: Progressing

## 2022-07-30 NOTE — Progress Notes (Addendum)
ANTICOAGULATION CONSULT NOTE - Follow Up  Pharmacy Consult for heparin Indication: atrial fibrillation  Allergies  Allergen Reactions   Janumet [Sitagliptin-Metformin Hcl] Shortness Of Breath   Januvia [Sitagliptin] Shortness Of Breath   Kombiglyze [Saxagliptin-Metformin Er] Shortness Of Breath   Elemental Sulfur Itching    Patient Measurements: Height: 5\' 2"  (157.5 cm) Weight: 86.1 kg (189 lb 14.4 oz) IBW/kg (Calculated) : 50.1 Heparin Dosing Weight: 75kg  Vital Signs: Temp: 97.9 F (36.6 C) (12/28 0343) Temp Source: Oral (12/28 0343) BP: 139/88 (12/28 0343) Pulse Rate: 88 (12/28 0343)  Labs: Recent Labs    07/28/22 0108 07/28/22 0823 07/28/22 1648 07/29/22 0234 07/30/22 0421  HGB 12.6  --   --  13.0 13.0  HCT 39.9  --   --  39.2 39.5  PLT 152  --   --  152 148*  HEPARINUNFRC  --    < > 0.45 0.54 0.38  CREATININE 0.80  --   --   --  0.87   < > = values in this interval not displayed.     Estimated Creatinine Clearance: 56.9 mL/min (by C-G formula based on SCr of 0.87 mg/dL).   Medical History: Past Medical History:  Diagnosis Date   Asthma    Chest pressure 07/13/2012   Diabetes mellitus without complication (HCC)    DM (diabetes mellitus) (HCC) 07/13/2012   HTN (hypertension) 07/13/2012   Hypercholesterolemia    Hypertension    SOB (shortness of breath) 07/13/2012   Tachycardia, unspecified, "fluttering" 07/13/2012    Assessment: 76yo female c/o strange sensation in LUE, found by EMS to be in Afb w/ RVR, given diltiazem bolus and now to start heparin.  MRI 12/26 with acute infarcts and petechial hemorrhage - will adjust heparin level goal to 0.3-0.5.  Heparin level therapeutic 0.38, CBC stable.  Goal of Therapy:  Heparin level 0.3-0.5 units/ml Monitor platelets by anticoagulation protocol: Yes   Plan:  Continue heparin at 800 units/hr. Daily heparin level and CBC  ADDENDUM transition to apixaban, qualifies for 5mg  BID dose.  1/27, PharmD, BCPS, [facility] Clinical Pharmacist [phone] Please check AMION for all [facility] Pharmacy numbers 07/30/2022

## 2022-07-30 NOTE — Progress Notes (Signed)
Rounding Note    Patient Name: Renee Bradley Date of Encounter: 07/30/2022  [place] Cardiologist:   Gwenlyn Found   Subjective   76 yo with new dx of atrial fib  Hx of diet controlled DM   Presented with numbness / tingling down her left arm  HR at presentation was 190   Stroke team was consulted yesterday for her L arm tingling  Brain MRI reveals an Acute infarction in the right frontoparietal junction region with edema and some petechial bleeding.   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  CTA of head / neck :  no significant carotid or vertebral disease   She has been cleared to be on heparin  Timing of eliquis initiation to be specified by the stroke rounding team  .  Still has limitations of her left hand / arm  Working with OT  Echo from 12/25 shows EF 35% Global hypokinesis   She has not had any angina symptoms I suspect her reduced LV function is due to Afib with RVR  Plan is to continue rate control Repeat echo in several months ,  if her LV function remains reduced , will need ischemic work up   [employer] to have a slight headache    Starting entresto and jardiance today    Inpatient Medications    Scheduled Meds:  metoprolol tartrate  25 mg Oral Q6H   rosuvastatin  40 mg Oral Daily   Continuous Infusions:  heparin 800 Units/hr (07/29/22 1925)   PRN Meds:    Vital Signs    Vitals:   07/29/22 1940 07/29/22 1950 07/30/22 0343 07/30/22 0802  BP:  131/85 139/88 (!) 140/92  Pulse:  90 88 85  Resp: 14 20 15 16   Temp:  98 F (36.7 C) 97.9 F (36.6 C) 98 F (36.7 C)  TempSrc:  Oral Oral Oral  SpO2:  97% 94% 98%  Weight:   86.1 kg   Height:        Intake/Output Summary (Last 24 hours) at 07/30/2022 0928 Last data filed at 07/29/2022 1940 Gross per 24 hour  Intake 407.76 ml  Output --  Net 407.76 ml       07/30/2022    3:43 AM 07/29/2022   12:09  AM 07/28/2022    4:24 AM  Last 3 Weights  Weight (lbs) 189 lb 14.4 oz 190 lb 14.4 oz 191 lb 3.2 oz  Weight (kg) 86.138 kg 86.592 kg 86.728 kg      Telemetry  Afib with controlled V response   ECG     - Personally Reviewed  Physical Exam   Physical Exam: Blood pressure (!) 140/92, pulse 85, temperature 98 F (36.7 C), temperature source Oral, resp. rate 16, height 5\' 2"  (1.575 m), weight 86.1 kg, SpO2 98 %.      GEN:  eldelry female,   in no acute distress HEENT: Normal NECK: No JVD; No carotid bruits LYMPHATICS: No lymphadenopathy CARDIAC: irreg. Irreg.  RESPIRATORY:  Clear to auscultation without rales, wheezing or rhonchi  ABDOMEN: Soft, non-tender, non-distended MUSCULOSKELETAL:  No edema; No deformity  SKIN: Warm and dry NEUROLOGIC:  motor defects of left hand      Labs    High Sensitivity Troponin:   Recent Labs  Lab 07/27/22 0031 07/27/22 0231  TROPONINIHS 6 7      Chemistry Recent Labs  Lab 07/27/22 0031 07/28/22 0108 07/29/22 0234 07/30/22 0421  NA 143 145  --  144  K 4.6 3.4*  --  3.9  CL 112* 115*  --  114*  CO2 21* 22  --  21*  GLUCOSE 164* 78  --  62*  BUN 20 11  --  8  CREATININE 1.32* 0.80  --  0.87  CALCIUM 9.0 8.9  --  8.9  MG  --   --  2.0  --   GFRNONAA 42* >60  --  >60  ANIONGAP 10 8  --  9     Lipids  Recent Labs  Lab 07/29/22 0234  CHOL 144  TRIG 55  HDL 55  LDLCALC 78  CHOLHDL 2.6     Hematology Recent Labs  Lab 07/28/22 0108 07/29/22 0234 07/30/22 0421  WBC 3.9* 3.2* 4.0  RBC 4.39 4.46 4.44  HGB 12.6 13.0 13.0  HCT 39.9 39.2 39.5  MCV 90.9 87.9 89.0  MCH 28.7 29.1 29.3  MCHC 31.6 33.2 32.9  RDW 14.9 15.0 14.8  PLT 152 152 148*    Thyroid  Recent Labs  Lab 07/30/22 0421  TSH 1.115    BNP Recent Labs  Lab 07/27/22 0031  BNP 562.3*     DDimer No results for input(s): "DDIMER" in the last 168 hours.   Radiology    CT ANGIO HEAD NECK W WO CM  Result Date: 07/28/2022 CLINICAL DATA:   Follow-up examination for stroke, determine embolic source. EXAM: CT ANGIOGRAPHY HEAD AND NECK TECHNIQUE: Multidetector CT imaging of the head and neck was performed using the standard protocol during bolus administration of intravenous contrast. Multiplanar CT image reconstructions and MIPs were obtained to evaluate the vascular anatomy. Carotid stenosis measurements (when applicable) are obtained utilizing NASCET criteria, using the distal internal carotid diameter as the denominator. RADIATION DOSE REDUCTION: This exam was performed according to the departmental dose-optimization program which includes automated exposure control, adjustment of the mA and/or kV according to patient size and/or use of iterative reconstruction technique. CONTRAST:  110mL OMNIPAQUE IOHEXOL 350 MG/ML SOLN COMPARISON:  Prior brain MRI from earlier the same day. FINDINGS: CT HEAD FINDINGS Brain: Previously identified right MCA distribution infarct involving posterior right frontoparietal junction again seen, stable in size from prior MRI. Associated petechial blood products noted, not significantly changed. No frank hemorrhagic transformation or significant regional mass effect. No other large vessel territory infarct. Underlying mild chronic small vessel ischemic disease. Chronic left parietal infarct noted. No acute intracranial hemorrhage. No mass lesion or midline shift. No hydrocephalus or extra-axial fluid collection. Vascular: No abnormal hyperdense vessel. Skull: Scalp soft tissues and calvarium within normal limits. Sinuses/Orbits: Globes orbital soft tissues within normal limits. Paranasal sinuses are largely clear. No mastoid effusion. Other: None. Review of the MIP images confirms the above findings CTA NECK FINDINGS Aortic arch: Visualized aortic arch normal in caliber with standard 3 vessel morphology. No stenosis about the origin the great vessels. Right carotid system: Right common and internal carotid arteries are patent  without dissection. No hemodynamically significant stenosis about the right carotid artery system. Left carotid system: Left common and internal carotid arteries are patent without dissection. No hemodynamically significant stenosis about the left carotid artery system. Vertebral arteries: Both vertebral arteries arise from subclavian arteries. No proximal subclavian artery stenosis. Vertebral arteries patent without stenosis or dissection. Skeleton: No discrete or worrisome osseous lesions. Moderate spondylosis present at C4-5 and C5-6. Moderate right upper cervical facet arthropathy. Other neck: No other acute soft tissue abnormality within the neck. Upper chest:  Visualized upper chest demonstrates no acute finding. Review of the MIP images confirms the above findings CTA HEAD FINDINGS Anterior circulation: Mild atheromatous change within the carotid siphons without hemodynamically significant stenosis. A1 segments patent bilaterally. Normal anterior communicating artery complex. Anterior cerebral arteries patent without stenosis. No M1 stenosis or occlusion. No proximal MCA branch occlusion or high-grade stenosis. Distal MCA branches perfused and symmetric. Posterior circulation: Both V4 segments patent without stenosis. Neither PICA origin well visualized. Basilar widely patent to its distal aspect without stenosis. Superior cerebellar and posterior cerebral arteries patent bilaterally. Venous sinuses: Patent allowing for timing the contrast bolus. Anatomic variants: None significant.  No aneurysm. Review of the MIP images confirms the above findings IMPRESSION: CT HEAD IMPRESSION: 1. Stable size of evolving right MCA distribution infarct. Associated petechial blood products without frank hemorrhagic transformation or significant regional mass effect. 2. No other new acute intracranial abnormality. 3. Underlying mild chronic small vessel ischemic disease with chronic left parietal infarct. CTA HEAD AND NECK: 1.  Negative CTA for large vessel occlusion or other emergent finding. 2. Mild atheromatous change about the carotid siphons without hemodynamically significant stenosis. 3. No other hemodynamically significant or correctable stenosis about the major arterial vasculature of the head and neck. Electronically Signed   By: Rise Mu M.D.   On: 07/28/2022 20:18   MR BRAIN WO CONTRAST  Result Date: 07/28/2022 CLINICAL DATA:  Neuro deficit, acute, stroke suspected. EXAM: MRI HEAD WITHOUT CONTRAST TECHNIQUE: Multiplanar, multiecho pulse sequences of the brain and surrounding structures were obtained without intravenous contrast. COMPARISON:  None Available. FINDINGS: Brain: Minimal chronic small-vessel ischemic change of the pons. No focal cerebellar finding. Cerebral hemispheres show chronic small-vessel ischemic changes with an old left parietal cortical and subcortical infarction. There is acute infarction in the right frontoparietal junction region with edema and some petechial bleeding. No clear hematoma. The possibility of an underlying mass lesion is considered but is felt unlikely. No evidence of hydrocephalus or extra-axial collection. Vascular: Major vessels at the base of the brain show flow. Skull and upper cervical spine: Negative Sinuses/Orbits: Clear/normal Other: None IMPRESSION: 1. 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 The infarction measures approximately 3 cm in size. The possibility of an underlying mass lesion is considered but is felt unlikely. 2. Chronic small-vessel ischemic changes elsewhere throughout the brain as outlined above. 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 Electronically Signed   By: Paulina Fusi M.D.   On: 07/28/2022 12:30    Cardiac Studies     Patient Profile     76 y.o. female with new onset Afib,  persistant left arm  weakness, tingling    Assessment & Plan      Atrial fib :  HR is better controlled .   She is currently on IV heparin .   Had a new stroke this admission  Has been found to have a previous stroke  HR is well controlled . Switching to eliquis from heparin   CHADS2VASC is   4  ( female, age 63, HTN, CHF, CVA, DM )    2.  Chronic combined CHF:   EF on Dec. 25 was noted to be 30-35%.   Global hypokinesis  Troponins were negative .   Presenting HR was 190s.   This could be a tachycardia medicated hypokinesis which should improved with control of her LV function   Start Entresto 24-26 BID  Jardiance 10 mg a day  Consider spiro in the next day or so  if BP is ok   My plan is to continue  medications to control her HR . Repeat echo in 4-6 weeks to see if she has improved.  If there has been no improvement, she will need an ischemic evaluation .     For questions or updates, please contact [place] Please consult [email or web address] for contact info under        Signed, Mertie Moores, MD  07/30/2022, 9:28 AM

## 2022-07-31 DIAGNOSIS — I4891 Unspecified atrial fibrillation: Secondary | ICD-10-CM | POA: Diagnosis not present

## 2022-07-31 DIAGNOSIS — I5043 Acute on chronic combined systolic (congestive) and diastolic (congestive) heart failure: Secondary | ICD-10-CM | POA: Diagnosis not present

## 2022-07-31 LAB — BASIC METABOLIC PANEL
Anion gap: 13 (ref 5–15)
BUN: 10 mg/dL (ref 8–23)
CO2: 20 mmol/L — ABNORMAL LOW (ref 22–32)
Calcium: 9.1 mg/dL (ref 8.9–10.3)
Chloride: 111 mmol/L (ref 98–111)
Creatinine, Ser: 0.94 mg/dL (ref 0.44–1.00)
GFR, Estimated: >60 mL/min (ref >60)
Glucose, Bld: 64 mg/dL — ABNORMAL LOW (ref 70–99)
Potassium: 3.8 mmol/L (ref 3.5–5.1)
Sodium: 144 mmol/L (ref 135–145)

## 2022-07-31 LAB — CBC
HCT: 42.4 % (ref 36.0–46.0)
Hemoglobin: 14.2 g/dL (ref 12.0–15.0)
MCH: 29.1 pg (ref 26.0–34.0)
MCHC: 33.5 g/dL (ref 30.0–36.0)
MCV: 86.9 fL (ref 80.0–100.0)
Platelets: 143 10*3/uL — ABNORMAL LOW (ref 150–400)
RBC: 4.88 MIL/uL (ref 3.87–5.11)
RDW: 14.8 % (ref 11.5–15.5)
WBC: 3.9 10*3/uL — ABNORMAL LOW (ref 4.0–10.5)
nRBC: 0 % (ref 0.0–0.2)

## 2022-07-31 NOTE — Discharge Instructions (Signed)

## 2022-07-31 NOTE — Progress Notes (Signed)
Rounding Note    Patient Name: Renee Bradley Date of Encounter: 07/31/2022  [place] Cardiologist:   Gwenlyn Found   Subjective   76 yo with new dx of atrial fib  Hx of diet controlled DM   Presented with numbness / tingling down her left arm  HR at presentation was 190   Stroke team was consulted yesterday for her L arm tingling  Brain MRI reveals an Acute infarction in the right frontoparietal junction region with edema and some petechial bleeding.   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  CTA of head / neck :  no significant carotid or vertebral disease    Echo from 12/25 shows EF 35% Global hypokinesis   She has not had any angina symptoms I suspect her reduced LV function is due to Afib with RVR  Plan is to continue rate control Repeat echo in several months ,  if her LV function remains reduced , will need ischemic work up   [place] to have a slight headache    Started entresto and jardiance yesterday  She became tachycardic this am after going to the bathroom  Has remained tachycardic .  Perhaps this is a reflexive increase in her HR because of the strong BP lowering effect / diuretic effect  of Entresto and Jardiance.   Will hold Jardiance and see if this helps .   Hb is stable ,  BUN and creatinine are stable Pt is asymptomatic    Inpatient Medications    Scheduled Meds:  apixaban  5 mg Oral BID   metoprolol tartrate  25 mg Oral Q6H   rosuvastatin  40 mg Oral Daily   sacubitril-valsartan  1 tablet Oral BID   Continuous Infusions:   PRN Meds:    Vital Signs    Vitals:   07/31/22 0120 07/31/22 0501 07/31/22 0726 07/31/22 1003  BP:  (!) 136/95 139/88 109/65  Pulse:  (!) 119 84 (!) 124  Resp:   16 16  Temp: 98 F (36.7 C) 97.8 F (36.6 C) 98 F (36.7 C)   TempSrc: Oral Oral Oral   SpO2:  96% 97% 96%  Weight:  84.1 kg    Height:         Intake/Output Summary (Last 24 hours) at 07/31/2022 1015 Last data filed at 07/30/2022 2135 Gross per 24 hour  Intake 240 ml  Output --  Net 240 ml      07/31/2022    5:01 AM 07/30/2022    3:43 AM 07/29/2022   12:09 AM  Last 3 Weights  Weight (lbs) 185 lb 8 oz 189 lb 14.4 oz 190 lb 14.4 oz  Weight (kg) 84.142 kg 86.138 kg 86.592 kg      Telemetry  Afib with controlled V response   ECG     - Personally Reviewed  Physical Exam   Physical Exam: Blood pressure 109/65, pulse (!) 124, temperature 98 F (36.7 C), temperature source Oral, resp. rate 16, height 5\' 2"  (1.575 m), weight 84.1 kg, SpO2 96 %.       GEN:  Well nourished, well developed in no acute distress HEENT: Normal NECK: No JVD; No carotid bruits LYMPHATICS: No lymphadenopathy CARDIAC: irreg. Irreg.  , tachy  RESPIRATORY:  Clear to auscultation without rales, wheezing or rhonchi  ABDOMEN: Soft, non-tender, non-distended MUSCULOSKELETAL:  No edema; No deformity  SKIN: Warm and dry NEUROLOGIC:  Alert and oriented x 3  Labs    High Sensitivity Troponin:   Recent Labs  Lab 07/27/22 0031 07/27/22 0231  TROPONINIHS 6 7     Chemistry Recent Labs  Lab 07/28/22 0108 07/29/22 0234 07/30/22 0421 07/31/22 0134  NA 145  --  144 144  K 3.4*  --  3.9 3.8  CL 115*  --  114* 111  CO2 22  --  21* 20*  GLUCOSE 78  --  62* 64*  BUN 11  --  8 10  CREATININE 0.80  --  0.87 0.94  CALCIUM 8.9  --  8.9 9.1  MG  --  2.0  --   --   GFRNONAA >60  --  >60 >60  ANIONGAP 8  --  9 13    Lipids  Recent Labs  Lab 07/29/22 0234  CHOL 144  TRIG 55  HDL 55  LDLCALC 78  CHOLHDL 2.6    Hematology Recent Labs  Lab 07/29/22 0234 07/30/22 0421 07/31/22 0134  WBC 3.2* 4.0 3.9*  RBC 4.46 4.44 4.88  HGB 13.0 13.0 14.2  HCT 39.2 39.5 42.4  MCV 87.9 89.0 86.9  MCH 29.1 29.3 29.1  MCHC 33.2 32.9 33.5  RDW 15.0 14.8 14.8  PLT 152 148* 143*   Thyroid  Recent Labs  Lab 07/30/22 0421  TSH 1.115     BNP Recent Labs  Lab 07/27/22 0031  BNP 562.3*    DDimer No results for input(s): "DDIMER" in the last 168 hours.   Radiology    No results found.  Cardiac Studies     Patient Profile     76 y.o. female with new onset Afib,  persistant left arm weakness, tingling    Assessment & Plan      Atrial fib :  HR is higher today .  I started Entresto and Jardiance yesterday    CHADS2VASC is   17  ( female, age 57, HTN, CHF, CVA, DM )   Her ventricular rate increased this am after washing up .  She is on metoprolol 25 Q 6 hr . BP is low.  I hesitate to add more metoprolol as she is asymptomatic  Will hold Jardiance Cont. Entresto 24-26 BID  Encouraged her to eat and drink well     2.  Chronic combined CHF:   EF on Dec. 25 was noted to be 30-35%.   Global hypokinesis  Troponins were negative .   Presenting HR was 190s.   This could be a tachycardia medicated hypokinesis which should improved with control of her LV function   Entresto 24-26 BID and Jardiance 10 mg QD were added yesterday  Will hold Jardiance starting today since she has developed a rapid ventricular  response this am ( has been much better controlled until this am)   My plan is to continue  medications to control her HR . Repeat echo in 4-6 weeks to see if she has improved.  If there has been no improvement, she will need an ischemic evaluation .     For questions or updates, please contact [facility] Please consult [email or web address] for contact info under        Signed, Kristeen Miss, MD  07/31/2022, 10:15 AM

## 2022-07-31 NOTE — Progress Notes (Signed)
   Heart Failure Stewardship Pharmacist Progress Note   PCP: Dorothyann Peng, MD PCP-Cardiologist: None    HPI:  76 yo F with PMH of T2DM, obesity, and HTN.   Presented to the ED on 12/25 with L arm numbness. Found to be in afib RVR. CTA shows mild cardiomegaly w/ biatrial dilation, no aortic dissection, and no pleural effusion. BNP elevated. ECHO 12/25 showed LVEF 30-35% (previously 55-60%), global hypokinesis, and RV normal. Stroke team consulted to evaluate numbness, tingling, and lack of coordination. Brain MRI showed acute infract in right frontoparietal junction region with edema and some petechial bleeding. Possible underlying mass. Recommending permissive HTN x 48 hours from symptom onset, goal BP <220/110 but gradually normalize in 5-7 days.  Current HF Medications: Beta Blocker: metoprolol tartrate 25 mg q6h ACE/ARB/ARNI: Entresto 24/26 mg BID  Prior to admission HF Medications: Diuretic: furosemide 20 mg daily  Pertinent Lab Values: Serum creatinine 0.94, BUN 10, Potassium 3.8, Sodium 144, Magnesium 2.0, BNP 562.3, A1c 6.5   Vital Signs: Weight: 185 lbs (admission weight: 193 lbs) Blood pressure: 110-140/90s  Heart rate: 80-100s in afib  I/O: not well documented   Medication Assistance / Insurance Benefits Check: Does the patient have prescription insurance?  Yes Type of insurance plan: Development worker, community Plan  Outpatient Pharmacy:  Prior to admission [facility] Is the patient willing to use [facility] TOC pharmacy at discharge? Yes Is the patient willing to transition their outpatient pharmacy to utilize a [facility] outpatient pharmacy?   Pending    Assessment: 1. Acute systolic CHF (LVEF 30-35%). Ischemic evaluation after repeat ECHO as outpatient. NYHA class II symptoms. - Not volume overloaded on exam, questionable overdiuresis. Strict I/Os and daily weights. Keep K>4 and Mag>2.  - Continue metoprolol tartrate 25 mg q6h, consider consolidating to  metoprolol XL prior to discharge - Continue Entresto 24/26 mg BID  - Off Jardiance with possible overdiuresis   Plan: 1) Medication changes recommended at this time: - Agree with changes  2) Patient assistance: - Has federal employee insurance, can use monthly copay cards - Provided copay card for Entresto and Jardiance  3)  Education  - Patient has been educated on current HF medications and potential additions to HF medication regimen - Patient verbalizes understanding that over the next few months, these medication doses may change and more medications may be added to optimize HF regimen - Patient has been educated on basic disease state pathophysiology and goals of therapy  Sharen Hones, PharmD, BCPS Heart Failure Engineer, building services Phone [phone]

## 2022-07-31 NOTE — Progress Notes (Addendum)
Mobility Specialist Progress Note   07/31/22 1149  Mobility  Activity Ambulated with assistance in hallway;Dangled on edge of bed  Level of Assistance Standby assist, set-up cues, supervision of patient - no hands on  Assistive Device None  Distance Ambulated (ft) 360 ft  Range of Motion/Exercises Active;All extremities  Activity Response Tolerated well   Pre Mobility:  HR 96 During Mobility: HR 127-141 (Afib) Post Mobility: HR 121 +/-  Patient received in supine and eager to participate. Ambulated supervision level with slow steady gait. Completed education on energy conservation and Afib. Returned to room without complaint or incident. Was left dangling EOB with all needs met, call bell in reach.   [place] Indea Dearman, BS EXP Mobility Specialist Please contact via SecureChat or Rehab office at [phone]

## 2022-07-31 NOTE — Plan of Care (Signed)
  Problem: Health Behavior/Discharge Planning: Goal: Ability to manage health-related needs will improve Outcome: Progressing   Problem: Clinical Measurements: Goal: Ability to maintain clinical measurements within normal limits will improve Outcome: Progressing Goal: Will remain free from infection Outcome: Progressing Goal: Diagnostic test results will improve Outcome: Progressing Goal: Respiratory complications will improve Outcome: Progressing Goal: Cardiovascular complication will be avoided Outcome: Progressing   Problem: Activity: Goal: Risk for activity intolerance will decrease Outcome: Progressing   Problem: Nutrition: Goal: Adequate nutrition will be maintained Outcome: Progressing   Problem: Coping: Goal: Level of anxiety will decrease Outcome: Progressing   Problem: Elimination: Goal: Will not experience complications related to bowel motility Outcome: Progressing Goal: Will not experience complications related to urinary retention Outcome: Progressing   Problem: Pain Managment: Goal: General experience of comfort will improve Outcome: Progressing   Problem: Safety: Goal: Ability to remain free from injury will improve Outcome: Progressing   Problem: Skin Integrity: Goal: Risk for impaired skin integrity will decrease Outcome: Progressing   Problem: Education: Goal: Knowledge of disease or condition will improve Outcome: Progressing Goal: Knowledge of secondary prevention will improve (MUST DOCUMENT ALL) Outcome: Progressing Goal: Knowledge of patient specific risk factors will improve Loraine Leriche N/A or DELETE if not current risk factor) Outcome: Progressing   Problem: Ischemic Stroke/TIA Tissue Perfusion: Goal: Complications of ischemic stroke/TIA will be minimized Outcome: Progressing   Problem: Coping: Goal: Will verbalize positive feelings about self Outcome: Progressing Goal: Will identify appropriate support needs Outcome: Progressing    Problem: Health Behavior/Discharge Planning: Goal: Ability to manage health-related needs will improve Outcome: Progressing Goal: Goals will be collaboratively established with patient/family Outcome: Progressing   Problem: Self-Care: Goal: Ability to participate in self-care as condition permits will improve Outcome: Progressing Goal: Verbalization of feelings and concerns over difficulty with self-care will improve Outcome: Progressing Goal: Ability to communicate needs accurately will improve Outcome: Progressing   Problem: Nutrition: Goal: Risk of aspiration will decrease Outcome: Progressing Goal: Dietary intake will improve Outcome: Progressing   Problem: Education: Goal: Knowledge of disease or condition will improve Outcome: Progressing Goal: Understanding of medication regimen will improve Outcome: Progressing Goal: Individualized Educational Video(s) Outcome: Progressing   Problem: Activity: Goal: Ability to tolerate increased activity will improve Outcome: Progressing   Problem: Cardiac: Goal: Ability to achieve and maintain adequate cardiopulmonary perfusion will improve Outcome: Progressing   Problem: Health Behavior/Discharge Planning: Goal: Ability to safely manage health-related needs after discharge will improve Outcome: Progressing

## 2022-08-01 DIAGNOSIS — R279 Unspecified lack of coordination: Secondary | ICD-10-CM | POA: Diagnosis present

## 2022-08-01 DIAGNOSIS — R29702 NIHSS score 2: Secondary | ICD-10-CM | POA: Diagnosis present

## 2022-08-01 DIAGNOSIS — Z8249 Family history of ischemic heart disease and other diseases of the circulatory system: Secondary | ICD-10-CM | POA: Diagnosis not present

## 2022-08-01 DIAGNOSIS — I4891 Unspecified atrial fibrillation: Secondary | ICD-10-CM | POA: Diagnosis present

## 2022-08-01 DIAGNOSIS — I110 Hypertensive heart disease with heart failure: Secondary | ICD-10-CM | POA: Diagnosis present

## 2022-08-01 DIAGNOSIS — Z90710 Acquired absence of both cervix and uterus: Secondary | ICD-10-CM | POA: Diagnosis not present

## 2022-08-01 DIAGNOSIS — N390 Urinary tract infection, site not specified: Secondary | ICD-10-CM | POA: Diagnosis not present

## 2022-08-01 DIAGNOSIS — E7800 Pure hypercholesterolemia, unspecified: Secondary | ICD-10-CM | POA: Diagnosis present

## 2022-08-01 DIAGNOSIS — K8020 Calculus of gallbladder without cholecystitis without obstruction: Secondary | ICD-10-CM | POA: Diagnosis present

## 2022-08-01 DIAGNOSIS — N179 Acute kidney failure, unspecified: Secondary | ICD-10-CM | POA: Diagnosis present

## 2022-08-01 DIAGNOSIS — I63411 Cerebral infarction due to embolism of right middle cerebral artery: Secondary | ICD-10-CM | POA: Diagnosis present

## 2022-08-01 DIAGNOSIS — Z888 Allergy status to other drugs, medicaments and biological substances status: Secondary | ICD-10-CM | POA: Diagnosis not present

## 2022-08-01 DIAGNOSIS — I428 Other cardiomyopathies: Secondary | ICD-10-CM | POA: Diagnosis present

## 2022-08-01 DIAGNOSIS — Z79899 Other long term (current) drug therapy: Secondary | ICD-10-CM | POA: Diagnosis not present

## 2022-08-01 DIAGNOSIS — Z87891 Personal history of nicotine dependence: Secondary | ICD-10-CM | POA: Diagnosis not present

## 2022-08-01 DIAGNOSIS — R000 Tachycardia, unspecified: Secondary | ICD-10-CM | POA: Diagnosis not present

## 2022-08-01 DIAGNOSIS — Z882 Allergy status to sulfonamides status: Secondary | ICD-10-CM | POA: Diagnosis not present

## 2022-08-01 DIAGNOSIS — K5730 Diverticulosis of large intestine without perforation or abscess without bleeding: Secondary | ICD-10-CM | POA: Diagnosis present

## 2022-08-01 DIAGNOSIS — I5021 Acute systolic (congestive) heart failure: Secondary | ICD-10-CM | POA: Diagnosis not present

## 2022-08-01 DIAGNOSIS — Z7982 Long term (current) use of aspirin: Secondary | ICD-10-CM | POA: Diagnosis not present

## 2022-08-01 DIAGNOSIS — I5042 Chronic combined systolic (congestive) and diastolic (congestive) heart failure: Secondary | ICD-10-CM | POA: Diagnosis present

## 2022-08-01 DIAGNOSIS — E1165 Type 2 diabetes mellitus with hyperglycemia: Secondary | ICD-10-CM | POA: Diagnosis present

## 2022-08-01 DIAGNOSIS — I611 Nontraumatic intracerebral hemorrhage in hemisphere, cortical: Secondary | ICD-10-CM | POA: Diagnosis present

## 2022-08-01 DIAGNOSIS — Z6834 Body mass index (BMI) 34.0-34.9, adult: Secondary | ICD-10-CM | POA: Diagnosis not present

## 2022-08-01 DIAGNOSIS — E669 Obesity, unspecified: Secondary | ICD-10-CM | POA: Diagnosis present

## 2022-08-01 LAB — BASIC METABOLIC PANEL
Anion gap: 12 (ref 5–15)
BUN: 15 mg/dL (ref 8–23)
CO2: 21 mmol/L — ABNORMAL LOW (ref 22–32)
Calcium: 9.1 mg/dL (ref 8.9–10.3)
Chloride: 109 mmol/L (ref 98–111)
Creatinine, Ser: 1.09 mg/dL — ABNORMAL HIGH (ref 0.44–1.00)
GFR, Estimated: 53 mL/min — ABNORMAL LOW (ref >60)
Glucose, Bld: 89 mg/dL (ref 70–99)
Potassium: 4 mmol/L (ref 3.5–5.1)
Sodium: 142 mmol/L (ref 135–145)

## 2022-08-01 LAB — CBC
HCT: 43.5 % (ref 36.0–46.0)
Hemoglobin: 14.7 g/dL (ref 12.0–15.0)
MCH: 29.4 pg (ref 26.0–34.0)
MCHC: 33.8 g/dL (ref 30.0–36.0)
MCV: 87 fL (ref 80.0–100.0)
Platelets: 161 10*3/uL (ref 150–400)
RBC: 5 MIL/uL (ref 3.87–5.11)
RDW: 14.9 % (ref 11.5–15.5)
WBC: 3.8 10*3/uL — ABNORMAL LOW (ref 4.0–10.5)
nRBC: 0 % (ref 0.0–0.2)

## 2022-08-01 MED ORDER — DIGOXIN 125 MCG PO TABS
0.1250 mg | ORAL_TABLET | Freq: Every day | ORAL | Status: DC
  Administered 2022-08-01 – 2022-08-02 (×2): 0.125 mg via ORAL
  Filled 2022-08-01 (×2): qty 1

## 2022-08-01 MED ORDER — METOPROLOL TARTRATE 50 MG PO TABS
75.0000 mg | ORAL_TABLET | Freq: Two times a day (BID) | ORAL | Status: DC
  Administered 2022-08-01 – 2022-08-02 (×2): 75 mg via ORAL
  Filled 2022-08-01 (×2): qty 1

## 2022-08-01 NOTE — Progress Notes (Addendum)
Occupational Therapy Treatment Patient Details Name: Renee Bradley MRN: [id number] DOB: 28-Jun-1946 Today's Date: 08/01/2022   History of present illness Patient is a 76 year old female presenting with numbness and clumsiness of her left arm. Found to be in A-fib with RVR. 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 PMH includes DM, obesity, HTN, asthma, HTN, hypercholesteremia.   OT comments  Pt progressing towards goals, session focused on IADL med mgmt and LUE exercise. Pt states she typically takes around 5 medications at home. Pt completing pillbox assessment with 10+ errors needing more than allotted 5 min and max cues for problem solving when filling pillbox,  discussed need for supervision/assist with medications at d/c, pt verbalized understanding and family present at end of session. Pt able to complete seated LUE therex at EOB, also provided with squeeze ball. HR in 140's-150's throughout session seated EOB. Pt presenting with impairments listed below, will follow acutely. Continue to recommend OP OT at d/c.   Recommendations for follow up therapy are one component of a multi-disciplinary discharge planning process, led by the attending physician.  Recommendations may be updated based on patient status, additional functional criteria and insurance authorization.    Follow Up Recommendations  Outpatient OT     Assistance Recommended at Discharge Intermittent Supervision/Assistance  Patient can return home with the following  A little help with bathing/dressing/bathroom;Assistance with cooking/housework;Direct supervision/assist for medications management;Help with stairs or ramp for entrance;Assist for transportation;Direct supervision/assist for financial management   Equipment Recommendations  Tub/shower seat    Recommendations for Other Services Speech consult    Precautions / Restrictions Precautions Precautions:  Fall Restrictions Weight Bearing Restrictions: No       Mobility Bed Mobility               General bed mobility comments: pt EOB upon arrival and departure    Transfers                         Balance                                           ADL either performed or assessed with clinical judgement   ADL                                         General ADL Comments: session focus on UE therex and med mgmt IADL    Extremity/Trunk Assessment Upper Extremity Assessment Upper Extremity Assessment: LUE deficits/detail LUE Deficits / Details: active ROM grossly WFL, reports impaired sensation of finger tips and dropping objects LUE Sensation: decreased light touch (distally) LUE Coordination: decreased fine motor   Lower Extremity Assessment Lower Extremity Assessment: Defer to PT evaluation        Vision   Vision Assessment?: No apparent visual deficits   Perception Perception Perception: Not tested   Praxis Praxis Praxis: Not tested    Cognition Arousal/Alertness: Awake/alert Behavior During Therapy: WFL for tasks assessed/performed, Anxious Overall Cognitive Status: Within Functional Limits for tasks assessed Area of Impairment: Attention, Memory                   Current Attention Level: Focused Memory: Decreased short-term memory  General Comments: 10+ errors when completing pillbox assessment, needing max cues for problem solving        Exercises Other Exercises Other Exercises: LUE thumb circles x5 Other Exercises: LUE digit add/abd x5 Other Exercises: LUE digit opposition x5 Other Exercises: LUE pronated isolated digit extension x5 Other Exercises: LUE squeeze ball x5    Shoulder Instructions       General Comments HR 140-150 throughout session seated EOB    Pertinent Vitals/ Pain       Pain Assessment Pain Assessment: No/denies pain  Home Living                                           Prior Functioning/Environment              Frequency  Min 2X/week        Progress Toward Goals  OT Goals(current goals can now be found in the care plan section)  Progress towards OT goals: Progressing toward goals  Acute Rehab OT Goals Patient Stated Goal: none stated OT Goal Formulation: With patient Time For Goal Achievement: 08/12/22 Potential to Achieve Goals: Good ADL Goals Pt Will Perform Upper Body Dressing: with modified independence;sitting Pt Will Perform Lower Body Dressing: with modified independence;sitting/lateral leans;sit to/from stand Pt/caregiver will Perform Home Exercise Program: Left upper extremity;With Supervision;With written HEP provided Additional ADL Goal #1: pt will receive passing score on pillbox assessment in order to promote ind with med mgmt  Plan Discharge plan remains appropriate;Frequency remains appropriate    Co-evaluation                 AM-PAC OT "6 Clicks" Daily Activity     Outcome Measure   Help from another person eating meals?: None Help from another person taking care of personal grooming?: A Little Help from another person toileting, which includes using toliet, bedpan, or urinal?: A Little Help from another person bathing (including washing, rinsing, drying)?: A Little Help from another person to put on and taking off regular upper body clothing?: A Little Help from another person to put on and taking off regular lower body clothing?: A Little 6 Click Score: 19    End of Session    OT Visit Diagnosis: Unsteadiness on feet (R26.81);Other abnormalities of gait and mobility (R26.89);Muscle weakness (generalized) (M62.81)   Activity Tolerance Patient tolerated treatment well   Patient Left in bed;with call bell/phone within reach;with family/visitor present   Nurse Communication Mobility status        Time: 8588-5027 OT Time Calculation (min): 28 min  Charges: OT  General Charges $OT Visit: 1 Visit OT Treatments $Self Care/Home Management : 8-22 mins $Therapeutic Exercise: 8-22 mins  Carver Fila, OTD, OTR/L SecureChat Preferred Acute Rehab [phone]  Carver Fila Koonce 08/01/2022, 2:37 PM

## 2022-08-01 NOTE — Progress Notes (Signed)
Rounding Note    Patient Name: Renee Bradley Date of Encounter: 08/01/2022  [facility] Cardiologist: None   Subjective   No dyspnea at rest, but became very tired and short of breath when she was washing up at the sink. Remains in atrial fibrillation.  Ventricular rate in the 90s overnight, but 120s while awake.  Arrhythmia unaware.  Inpatient Medications    Scheduled Meds:  apixaban  5 mg Oral BID   digoxin  0.125 mg Oral Daily   metoprolol tartrate  75 mg Oral BID   rosuvastatin  40 mg Oral Daily   sacubitril-valsartan  1 tablet Oral BID   Continuous Infusions:  PRN Meds: acetaminophen   Vital Signs    Vitals:   08/01/22 0012 08/01/22 0038 08/01/22 0544 08/01/22 0719  BP: 108/78  96/84 121/85  Pulse: 88 94  92  Resp:    18  Temp: 98.5 F (36.9 C)   97.6 F (36.4 C)  TempSrc: Oral   Oral  SpO2: 94%   95%  Weight:      Height:        Intake/Output Summary (Last 24 hours) at 08/01/2022 1143 Last data filed at 08/01/2022 0900 Gross per 24 hour  Intake 1056 ml  Output 600 ml  Net 456 ml      07/31/2022    5:01 AM 07/30/2022    3:43 AM 07/29/2022   12:09 AM  Last 3 Weights  Weight (lbs) 185 lb 8 oz 189 lb 14.4 oz 190 lb 14.4 oz  Weight (kg) 84.142 kg 86.138 kg 86.592 kg      Telemetry    Atrial fibrillation with rapid ventricular response- Personally Reviewed  ECG    Atrial fibrillation with borderline rapid ventricular response, minor intraventricular conduction delay- Personally Reviewed  Physical Exam  Appears comfortable lying flat in bed GEN: No acute distress.   Neck: No JVD Cardiac: Irregular, no murmurs, rubs, or gallops.  Respiratory: Clear to auscultation bilaterally. GI: Soft, nontender, non-distended  MS: No edema; No deformity. Neuro:  Nonfocal  Psych: Normal affect   Labs    High Sensitivity Troponin:   Recent Labs  Lab 07/27/22 0031 07/27/22 0231  TROPONINIHS 6 7     Chemistry Recent Labs  Lab  07/29/22 0234 07/30/22 0421 07/31/22 0134 08/01/22 0031  NA  --  144 144 142  K  --  3.9 3.8 4.0  CL  --  114* 111 109  CO2  --  21* 20* 21*  GLUCOSE  --  62* 64* 89  BUN  --  8 10 15   CREATININE  --  0.87 0.94 1.09*  CALCIUM  --  8.9 9.1 9.1  MG 2.0  --   --   --   GFRNONAA  --  >60 >60 53*  ANIONGAP  --  9 13 12     Lipids  Recent Labs  Lab 07/29/22 0234  CHOL 144  TRIG 55  HDL 55  LDLCALC 78  CHOLHDL 2.6    Hematology Recent Labs  Lab 07/30/22 0421 07/31/22 0134 08/01/22 0031  WBC 4.0 3.9* 3.8*  RBC 4.44 4.88 5.00  HGB 13.0 14.2 14.7  HCT 39.5 42.4 43.5  MCV 89.0 86.9 87.0  MCH 29.3 29.1 29.4  MCHC 32.9 33.5 33.8  RDW 14.8 14.8 14.9  PLT 148* 143* 161   Thyroid  Recent Labs  Lab 07/30/22 0421  TSH 1.115    BNP Recent Labs  Lab 07/27/22 0031  BNP 562.3*    DDimer No results for input(s): "DDIMER" in the last 168 hours.   Radiology    No results found.  Cardiac Studies   Echocardiogram 07/27/2022  - Left ventricle: The cavity size was normal. Wall thickness    was increased in a pattern of mild LVH. Systolic function    was normal. The estimated ejection fraction was in the    range of 55% to 60%. Wall motion was normal; there were no    regional wall motion abnormalities. Doppler parameters are    consistent with abnormal left ventricular relaxation    (grade 1 diastolic dysfunction).  - Mitral valve: Mildly thickened leaflets . Trivial    regurgitation. Valve area by pressure half-time: 2.32cm^2.  - Right ventricle: The cavity size was mildly dilated.    Systolic pressure was increased.  - Atrial septum: No defect or patent foramen ovale was    identified.  - Tricuspid valve: Mild regurgitation.  - Pulmonary arteries: PA peak pressure: 57mm Hg (S).    Reviewed images of the chest CT angiogram performed for pulmonary embolism.  Even though this was not a gated study, there is remarkably clear imaging of the proximal left coronary  system, including the entire left main, proximal LAD and proximal left circumflex coronary arteries, which do not have any stenoses or visible calcified plaque.  The right coronary artery is not as well-seen due to motion artifact.  There is scanty aortic atherosclerosis.     Patient Profile     75 y.o. female with history of type 2 diabetes mellitus presenting with small embolic stroke and found to have atrial fibrillation with rapid ventricular response and newly decreased left ventricular systolic function EF [id number].  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  She does not have angina pectoris, ischemic ECG changes or regional wall motion abnormalities and circumstantial findings on CT chest.  Ideally,  she will have a more detailed coronary evaluation, but coronary CT angiogram may actually not be a good quality study due to the arrhythmia.  Invasive coronary angiography does not appear to be indicated at this time. 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 Needs improved rate control.  Will increase the metoprolol and add low-dose digoxin.  Avoid diltiazem.  Tentative plan for cardioversion after a minimum of 3 weeks of uninterrupted anticoagulation. Mild increase in creatinine suggest that she is indeed a little hypovolemic.  Will continue to hold the Jardiance.  For questions or updates, please contact [place] Please consult [email or web address] for contact info under        Signed, Sanda Klein, MD  08/01/2022, 11:43 AM

## 2022-08-01 NOTE — Evaluation (Signed)
Speech Language Pathology Evaluation Patient Details Name: Renee Bradley MRN: [id number] DOB: 12-03-1945 Today's Date: 08/01/2022 Time: [id number] SLP Time Calculation (min) (ACUTE ONLY): 15 min  Problem List:  Patient Active Problem List   Diagnosis Date Noted   Atrial fibrillation ([place]) 08/01/2022   Atrial fibrillation with RVR ([place]) 07/27/2022   Constipation 12/02/2021   Estrogen deficiency 12/02/2021   Class 2 severe obesity due to excess calories with serious comorbidity and body mass index (BMI) of 37.0 to 37.9 in adult [facility]) 12/02/2021   Localized swelling of both lower legs 10/24/2020   Chronic renal disease, 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   Gastroesophageal reflux disease without esophagitis 07/13/2018   Chest pressure 07/13/2012   SOB (shortness of breath) 07/13/2012   DM (diabetes mellitus) ([place]) 07/13/2012   HTN (hypertension) 07/13/2012   Hyperlipidemia 07/13/2012   Tachycardia, unspecified, "fluttering" 07/13/2012   Past Medical History:  Past Medical History:  Diagnosis Date   Asthma    Chest pressure 07/13/2012   Diabetes mellitus without complication ([place])    DM (diabetes mellitus) ([place]) 07/13/2012   HTN (hypertension) 07/13/2012   Hypercholesterolemia    Hypertension    SOB (shortness of breath) 07/13/2012   Tachycardia, unspecified, "fluttering" 07/13/2012   Past Surgical History:  Past Surgical History:  Procedure Laterality Date   ABDOMINAL HYSTERECTOMY     HPI:  Patient is a 76 year old female presenting with numbness and clumsiness of her left arm. Found to be in A-fib with RVR. 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 PMH includes DM, obesity, HTN, asthma, HTN, hypercholesteremia.   Assessment / Plan / Recommendation Clinical Impression  Patient presents with a mild cognitive impairment as per this evaluation. Her speech and language function both appear to be  [facility]. 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 She demonstrated difficulties in organizing information, storage of new information, delayed recall, selective attention which led to errors on assessment. Patient did appear anxious at times, seeming to try to work too quickly through problems. She had difficulty recalling what the two therapists (OT and Mobility specialist) did with her but when given context she was able to recall and describe. She did recall name of Mobility specialist from yeseterday. SLP is recommending OP SLP to evaluated and treat for higher level cognitive function (specifically tasks such as medication and money management, organizing/planning).    SLP Assessment  SLP Recommendation/Assessment: All further Speech Lanaguage Pathology  needs can be addressed in the next venue of care SLP Visit Diagnosis: Cognitive communication deficit (R41.841)    Recommendations for follow up therapy are one component of a multi-disciplinary discharge planning process, led by the attending physician.  Recommendations may be updated based on patient status, additional functional criteria and insurance authorization.    Follow Up Recommendations  Outpatient SLP    Assistance Recommended at Discharge  Intermittent Supervision/Assistance  Functional Status Assessment Patient has had a recent decline in their functional status and demonstrates the ability to make significant improvements in function in a reasonable and predictable amount of time.  Frequency and Duration           SLP Evaluation Cognition  Overall Cognitive Status: Impaired/Different from baseline Arousal/Alertness: Awake/alert Orientation Level: Oriented X4 Year: 2023 Month: December Day of Week: Correct Attention: Selective Selective Attention: Impaired Selective Attention Impairment: Verbal complex;Functional complex Memory:  Impaired Memory Impairment: Storage deficit Awareness: Impaired Awareness Impairment: Anticipatory impairment Problem Solving: Impaired Problem Solving Impairment: Verbal complex;Functional complex Executive Function: Landscape architect: Impaired Organizing Impairment: Functional complex;Verbal complex Safety/Judgment: Appears intact       Comprehension  Auditory Comprehension Overall Auditory Comprehension: Appears within functional limits for tasks assessed    Expression Expression Primary Mode of Expression: Verbal Verbal Expression Overall Verbal Expression: Appears within functional limits for tasks assessed   Oral / Motor  Oral Motor/Sensory Function Overall Oral Motor/Sensory Function: Within functional limits Motor Speech Overall Motor Speech: Appears within functional limits for tasks assessed Respiration: Within functional limits Resonance: Within functional limits Articulation: Within functional limitis Intelligibility: Intelligible Motor Planning: Witnin functional limits            Angela Nevin, MA, CCC-SLP Speech Therapy

## 2022-08-01 NOTE — Progress Notes (Signed)
Mobility Specialist - Progress Note   08/01/22 1200  Mobility  Activity Ambulated with assistance in hallway  Level of Assistance Standby assist, set-up cues, supervision of patient - no hands on  Assistive Device None  Distance Ambulated (ft) 400 ft  Activity Response Tolerated well  $Mobility charge 1 Mobility    Pt received sitting EOB agreeable to mobility. No complaints throughout, tolerated increased distance well. Left in room w/ all needs met.   [place] Specialist Please contact via SecureChat or Rehab office at [phone]

## 2022-08-02 ENCOUNTER — Other Ambulatory Visit: Payer: Self-pay | Admitting: Cardiology

## 2022-08-02 DIAGNOSIS — I5021 Acute systolic (congestive) heart failure: Secondary | ICD-10-CM | POA: Diagnosis not present

## 2022-08-02 DIAGNOSIS — Z79899 Other long term (current) drug therapy: Secondary | ICD-10-CM

## 2022-08-02 DIAGNOSIS — I5020 Unspecified systolic (congestive) heart failure: Secondary | ICD-10-CM | POA: Insufficient documentation

## 2022-08-02 DIAGNOSIS — I639 Cerebral infarction, unspecified: Secondary | ICD-10-CM | POA: Insufficient documentation

## 2022-08-02 DIAGNOSIS — I4891 Unspecified atrial fibrillation: Secondary | ICD-10-CM | POA: Diagnosis not present

## 2022-08-02 LAB — CBC
HCT: 45.2 % (ref 36.0–46.0)
Hemoglobin: 15 g/dL (ref 12.0–15.0)
MCH: 29.2 pg (ref 26.0–34.0)
MCHC: 33.2 g/dL (ref 30.0–36.0)
MCV: 87.9 fL (ref 80.0–100.0)
Platelets: 161 10*3/uL (ref 150–400)
RBC: 5.14 MIL/uL — ABNORMAL HIGH (ref 3.87–5.11)
RDW: 15 % (ref 11.5–15.5)
WBC: 3.7 10*3/uL — ABNORMAL LOW (ref 4.0–10.5)
nRBC: 0 % (ref 0.0–0.2)

## 2022-08-02 LAB — BASIC METABOLIC PANEL
Anion gap: 14 (ref 5–15)
BUN: 17 mg/dL (ref 8–23)
CO2: 17 mmol/L — ABNORMAL LOW (ref 22–32)
Calcium: 9.1 mg/dL (ref 8.9–10.3)
Chloride: 111 mmol/L (ref 98–111)
Creatinine, Ser: 1.06 mg/dL — ABNORMAL HIGH (ref 0.44–1.00)
GFR, Estimated: 55 mL/min — ABNORMAL LOW (ref >60)
Glucose, Bld: 99 mg/dL (ref 70–99)
Potassium: 3.6 mmol/L (ref 3.5–5.1)
Sodium: 142 mmol/L (ref 135–145)

## 2022-08-02 LAB — URINALYSIS, COMPLETE (UACMP) WITH MICROSCOPIC
Bilirubin Urine: NEGATIVE
Glucose, UA: 50 mg/dL — AB
Ketones, ur: 5 mg/dL — AB
Nitrite: NEGATIVE
Protein, ur: 100 mg/dL — AB
Specific Gravity, Urine: 1.012 (ref 1.005–1.030)
pH: 5 (ref 5.0–8.0)

## 2022-08-02 MED ORDER — DIGOXIN 125 MCG PO TABS: 0.1250 mg | ORAL_TABLET | Freq: Every day | ORAL | 1 refills | Status: DC

## 2022-08-02 MED ORDER — SPIRONOLACTONE 12.5 MG HALF TABLET
12.5000 mg | ORAL_TABLET | Freq: Every day | ORAL | Status: DC
  Administered 2022-08-02: 12.5 mg via ORAL
  Filled 2022-08-02: qty 1

## 2022-08-02 MED ORDER — METOPROLOL TARTRATE 100 MG PO TABS: 100.0000 mg | ORAL_TABLET | Freq: Two times a day (BID) | ORAL | 0 refills | Status: DC

## 2022-08-02 MED ORDER — FUROSEMIDE 20 MG PO TABS: 20.0000 mg | ORAL_TABLET | Freq: Every day | ORAL | 0 refills | Status: DC | PRN

## 2022-08-02 MED ORDER — METOPROLOL TARTRATE 25 MG PO TABS
25.0000 mg | ORAL_TABLET | Freq: Once | ORAL | Status: AC
  Administered 2022-08-02: 25 mg via ORAL
  Filled 2022-08-02: qty 1

## 2022-08-02 MED ORDER — SPIRONOLACTONE 25 MG PO TABS: 12.5000 mg | ORAL_TABLET | Freq: Every day | ORAL | 0 refills | Status: DC

## 2022-08-02 MED ORDER — NITROFURANTOIN MONOHYD MACRO 100 MG PO CAPS: 100.0000 mg | ORAL_CAPSULE | Freq: Two times a day (BID) | ORAL | 0 refills | Status: AC

## 2022-08-02 MED ORDER — METOPROLOL TARTRATE 100 MG PO TABS: 100.0000 mg | ORAL_TABLET | Freq: Two times a day (BID) | ORAL | Status: DC

## 2022-08-02 MED ORDER — ROSUVASTATIN CALCIUM 40 MG PO TABS: 40.0000 mg | ORAL_TABLET | Freq: Every day | ORAL | 0 refills | Status: DC

## 2022-08-02 MED ORDER — APIXABAN 5 MG PO TABS: 5.0000 mg | ORAL_TABLET | Freq: Two times a day (BID) | ORAL | 3 refills | Status: DC

## 2022-08-02 MED ORDER — SACUBITRIL-VALSARTAN 24-26 MG PO TABS: 1.0000 | ORAL_TABLET | Freq: Two times a day (BID) | ORAL | 3 refills | Status: DC

## 2022-08-02 NOTE — Discharge Summary (Signed)
Discharge Summary    Patient ID: Renee Bradley MRN: [id number]; DOB: 30-Apr-1946  Admit date: 07/27/2022 Discharge date: 08/02/2022  PCP:  Renee Bradley, [place] Providers Cardiologist:  Renee Burow, MD      Discharge Diagnoses    Principal Problem:   Atrial fibrillation with RVR [facility]) Active Problems:   Hyperlipidemia   Atrial fibrillation ([place])   CVA (cerebral vascular accident) ([place])   HFrEF (heart failure with reduced ejection fraction) ([place])   Diagnostic Studies/Procedures    Echo: 07/27/2022  IMPRESSIONS     1. Left ventricular ejection fraction, by estimation, is 30 to 35%. The  left ventricle has moderately decreased function. The left ventricle  demonstrates global hypokinesis. Left ventricular diastolic function could  not be evaluated.   2. Right ventricular systolic function is normal. The right ventricular  size is normal. There is normal pulmonary artery systolic pressure.   3. Left atrial size was severely dilated.   4. Right atrial size was moderately dilated.   5. The mitral valve is normal in structure. Trivial mitral valve  regurgitation. No evidence of mitral stenosis.   6. The aortic valve is tricuspid. Aortic valve regurgitation is not  visualized. No aortic stenosis is present.   7. The inferior vena cava is normal in size with greater than 50%  respiratory variability, suggesting right atrial pressure of 3 mmHg.   FINDINGS   Left Ventricle: Left ventricular ejection fraction, by estimation, is 30  to 35%. The left ventricle has moderately decreased function. The left  ventricle demonstrates global hypokinesis. The left ventricular internal  cavity size was normal in size.  There is no left ventricular hypertrophy. Left ventricular diastolic  function could not be evaluated due to atrial fibrillation. Left  ventricular diastolic function could not be evaluated.   Right Ventricle: The right ventricular size is  normal. No increase in  right ventricular wall thickness. Right ventricular systolic function is  normal. There is normal pulmonary artery systolic pressure. The tricuspid  regurgitant velocity is 2.59 m/s, and   with an assumed right atrial pressure of 3 mmHg, the estimated right  ventricular systolic pressure is [id number] mmHg.   Left Atrium: Left atrial size was severely dilated.   Right Atrium: Right atrial size was moderately dilated.   Pericardium: There is no evidence of pericardial effusion.   Mitral Valve: The mitral valve is normal in structure. Trivial mitral  valve regurgitation. No evidence of mitral valve stenosis.   Tricuspid Valve: The tricuspid valve is normal in structure. Tricuspid  valve regurgitation is mild . No evidence of tricuspid stenosis.   Aortic Valve: The aortic valve is tricuspid. Aortic valve regurgitation is  not visualized. No aortic stenosis is present.   Pulmonic Valve: The pulmonic valve was normal in structure. Pulmonic valve  regurgitation is not visualized. No evidence of pulmonic stenosis.   Aorta: The aortic root is normal in size and structure.   Venous: The inferior vena cava is normal in size with greater than 50%  respiratory variability, suggesting right atrial pressure of 3 mmHg.   IAS/Shunts: No atrial level shunt detected by color flow Doppler.   _____________   History of Present Illness     Renee Bradley is a 76 y.o. female with pmhx diet-controlled diabetes (Hgb A1c 6.5), obesity and HTN who presented to the ER with a "strange sensation" in her left arm, and was found to be in new onset atrial fibrillation.  She was sitting at home and getting ready for bed when she noticed a numbness/tingling down her left arm. She called EMS. On arrival, patient reportedly had a HR to 190s and was given IV cardizem x1 with significant improvement of HR to 120s. On arrival to the ER, she was still in atrial fibrillation with heart rates in the  100s. She no longer had the feeling of numbness in her L arm. She was hemodynamically stable on arrival with BP 135/97, saturating 100% on RA. EKG showed atrial fibrillation without ST/T changes concerning for ischemia. CBC and CMP were wnl except for AKI to Cr 1.32 (baseline 0.95).  Prior to this event patient has never had atrial fibrillation, cardiac disease, stent or cardiac surgery. She had a TTE in 2014 with normal biventricular function and grade 1 DD. NM perfusion scan in 2014 that was performed due to her cardiac risk factors (family history of CAD, HTN, HLD, obesity, NIDDM) and SOB. NM stress showed decreased uptake inferolaterally towards the apex. Cardiac event monitor in 2014 showed sinus bradycardia, no atrial fibrillation. She has seen Dr. Gwenlyn Found in clinic in 2020 as an outpatient after hospitalization for SOB. At that time, medication for HTN and HLD were continued. Her PCP most recently did a TSH/T4 panel 04/23/2022 that was normal.   In the ER, patient was resting comfortably in bed. She is still in atrial fibrillation and is unsure when the rhythm started. She is asymptomatic. BP is stable. She no longer has numbness in her L arm. CT angio was done on arrival due to concern for widened mediastinum on CXR. CTA shows mild cardiomegaly w/ biatrial dilation, no aortic dissection, and no pleural effusion. CBC and CMP were wnl except for AKI (Cr 1.3 up from b/l 0.95). Troponins were negative. BNP was 500. She will be admitted to observation for workup of new onset atrial fibrillation.  Hospital Course     Consultants: Neurology  Atrial Fibrillation RVR -- Unclear duration on admission.  Started on IV heparin with ultimate transition to Eliquis prior to discharge.  Acute CVA recommendations for rate control with outpatient follow-up with consideration of cardioversion in 3 to 4 weeks. 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  Defer addition of SGLT2 in the setting of UTI -- Plan for outpatient echocardiogram in 4 to 6 weeks to see if improvement in LVEF.  If no improvement then would need ischemic evaluation -- outpatient appt arranged in [facility] clinic  CVA -- Developed numbness and tingling in her left arm.  CTA head and neck negative for large vessel occlusion.  MRI showed acute infarction in the right frontoparietal junction region with edema and some petechial bleeding. -- Seen by neurology recommendations for Eliquis 5mg  twice daily -- Continue statin -- follow up with neurology outpatient  UTI -- Reported symptoms of left flank pain, UA positive -- Discussed with pharmacy regarding antibiotic therapy given her cardiac medications -- Discharged with Macrobid 100 mg twice daily x 5 days  Patient was seen by Dr. Sallyanne Kuster and deemed stable for discharge  home. Follow up arranged. Medications sent to pharmacy of choice.   Did the patient have an acute coronary syndrome (MI, NSTEMI, STEMI, etc) this admission?:  No                               Did the patient have a percutaneous coronary intervention (stent / angioplasty)?:  No.        The patient will be scheduled for a TOC follow up appointment in 10-14 days.  Has follow-up in the [facility] heart failure clinic.  Also will send message to the A-fib clinic. _____________  Discharge Vitals Blood pressure 127/82, pulse 92, temperature (!) 97.5 F (36.4 C), temperature source Oral, resp. rate 18, height 5\' 2"  (1.575 m), weight 83.4 kg, SpO2 98 %.  Filed  Weights   07/30/22 0343 07/31/22 0501 08/02/22 0631  Weight: 86.1 kg 84.1 kg 83.4 kg    Labs & Radiologic Studies    CBC Recent Labs    08/01/22 0031 08/02/22 0030  WBC 3.8* 3.7*  HGB 14.7 15.0  HCT 43.5 45.2  MCV 87.0 87.9  PLT 161 161   Basic Metabolic Panel Recent Labs    08/04/22 0031 08/02/22 0030  NA 142 142  K 4.0 3.6  CL 109 111  CO2 21* 17*  GLUCOSE 89 99  BUN 15 17  CREATININE 1.09* 1.06*  CALCIUM 9.1 9.1   Liver Function Tests No results for input(s): "AST", "ALT", "ALKPHOS", "BILITOT", "PROT", "ALBUMIN" in the last 72 hours. No results for input(s): "LIPASE", "AMYLASE" in the last 72 hours. High Sensitivity Troponin:   Recent Labs  Lab 07/27/22 0031 07/27/22 0231  TROPONINIHS 6 7    BNP Invalid input(s): "POCBNP" D-Dimer No results for input(s): "DDIMER" in the last 72 hours. Hemoglobin A1C No results for input(s): "HGBA1C" in the last 72 hours. Fasting Lipid Panel No results for input(s): "CHOL", "HDL", "LDLCALC", "TRIG", "CHOLHDL", "LDLDIRECT" in the last 72 hours. Thyroid Function Tests No results for input(s): "TSH", "T4TOTAL", "T3FREE", "THYROIDAB" in the last 72 hours.  Invalid input(s): "FREET3" _____________  CT ANGIO HEAD NECK W WO CM  Result Date: 07/28/2022 CLINICAL DATA:  Follow-up examination for stroke, determine embolic source. EXAM: CT ANGIOGRAPHY HEAD AND NECK TECHNIQUE: Multidetector CT imaging of the head and neck was performed using the standard protocol during bolus administration of intravenous contrast. Multiplanar CT image reconstructions and MIPs were obtained to evaluate the vascular anatomy. Carotid stenosis measurements (when applicable) are obtained utilizing NASCET criteria, using the distal internal carotid diameter as the denominator. RADIATION DOSE REDUCTION: This exam was performed according to the departmental dose-optimization program which includes automated exposure control, adjustment of the mA and/or kV  according to patient size and/or use of iterative reconstruction technique. CONTRAST:  38mL OMNIPAQUE IOHEXOL 350 MG/ML SOLN COMPARISON:  Prior brain MRI from earlier the same day. FINDINGS: CT HEAD FINDINGS Brain: Previously identified right MCA distribution infarct involving posterior right frontoparietal junction again seen, stable in size from prior MRI. Associated petechial blood products noted, not significantly changed. No frank hemorrhagic transformation or significant regional mass effect. No other large vessel territory infarct. Underlying mild chronic small vessel ischemic disease. Chronic left parietal infarct noted. No acute intracranial hemorrhage. No mass lesion or midline shift. No hydrocephalus or extra-axial fluid collection. Vascular: No abnormal hyperdense vessel. Skull: Scalp soft tissues and calvarium within normal limits. Sinuses/Orbits: Globes orbital soft tissues within normal limits. Paranasal sinuses are largely  clear. No mastoid effusion. Other: None. Review of the MIP images confirms the above findings CTA NECK FINDINGS Aortic arch: Visualized aortic arch normal in caliber with standard 3 vessel morphology. No stenosis about the origin the great vessels. Right carotid system: Right common and internal carotid arteries are patent without dissection. No hemodynamically significant stenosis about the right carotid artery system. Left carotid system: Left common and internal carotid arteries are patent without dissection. No hemodynamically significant stenosis about the left carotid artery system. Vertebral arteries: Both vertebral arteries arise from subclavian arteries. No proximal subclavian artery stenosis. Vertebral arteries patent without stenosis or dissection. Skeleton: No discrete or worrisome osseous lesions. Moderate spondylosis present at C4-5 and C5-6. Moderate right upper cervical facet arthropathy. Other neck: No other acute soft tissue abnormality within the neck. Upper  chest: Visualized upper chest demonstrates no acute finding. Review of the MIP images confirms the above findings CTA HEAD FINDINGS Anterior circulation: Mild atheromatous change within the carotid siphons without hemodynamically significant stenosis. A1 segments patent bilaterally. Normal anterior communicating artery complex. Anterior cerebral arteries patent without stenosis. No M1 stenosis or occlusion. No proximal MCA branch occlusion or high-grade stenosis. Distal MCA branches perfused and symmetric. Posterior circulation: Both V4 segments patent without stenosis. Neither PICA origin well visualized. Basilar widely patent to its distal aspect without stenosis. Superior cerebellar and posterior cerebral arteries patent bilaterally. Venous sinuses: Patent allowing for timing the contrast bolus. Anatomic variants: None significant.  No aneurysm. Review of the MIP images confirms the above findings IMPRESSION: CT HEAD IMPRESSION: 1. Stable size of evolving right MCA distribution infarct. Associated petechial blood products without frank hemorrhagic transformation or significant regional mass effect. 2. No other new acute intracranial abnormality. 3. Underlying mild chronic small vessel ischemic disease with chronic left parietal infarct. CTA HEAD AND NECK: 1. Negative CTA for large vessel occlusion or other emergent finding. 2. Mild atheromatous change about the carotid siphons without hemodynamically significant stenosis. 3. No other hemodynamically significant or correctable stenosis about the major arterial vasculature of the head and neck. Electronically Signed   By: Jeannine Boga M.D.   On: 07/28/2022 20:18   MR BRAIN WO CONTRAST  Result Date: 07/28/2022 CLINICAL DATA:  Neuro deficit, acute, stroke suspected. EXAM: MRI HEAD WITHOUT CONTRAST TECHNIQUE: Multiplanar, multiecho pulse sequences of the brain and surrounding structures were obtained without intravenous contrast. COMPARISON:  None  Available. FINDINGS: Brain: Minimal chronic small-vessel ischemic change of the pons. No focal cerebellar finding. Cerebral hemispheres show chronic small-vessel ischemic changes with an old left parietal cortical and subcortical infarction. There is acute infarction in the right frontoparietal junction region with edema and some petechial bleeding. No clear hematoma. The possibility of an underlying mass lesion is considered but is felt unlikely. No evidence of hydrocephalus or extra-axial collection. Vascular: Major vessels at the base of the brain show flow. Skull and upper cervical spine: Negative Sinuses/Orbits: Clear/normal Other: None IMPRESSION: 1. 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 The infarction measures approximately 3 cm in size. The possibility of an underlying mass lesion is considered but is felt unlikely. 2. Chronic small-vessel ischemic changes elsewhere throughout the brain as outlined above. 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 Electronically Signed   By: Nelson Chimes M.D.   On: 07/28/2022 12:30   ECHOCARDIOGRAM COMPLETE  Result Date: 07/27/2022    ECHOCARDIOGRAM REPORT   Patient Name:   CARDEN KRIZMAN  Xie Date of Exam: 07/27/2022 Medical Rec #:  [id number]       Height:       63.0 in Accession #:    [id number]      Weight:       195.0 lb Date of Birth:  08/10/1945       BSA:          1.913 m Patient Age:    24 years        BP:           113/79 mmHg Patient Gender: F               HR:           83 bpm. Exam Location:  Inpatient Procedure: 2D Echo Indications:    atrial fibrillation  History:        Patient has prior history of Echocardiogram examinations, most                 recent 08/10/2012. Arrythmias:Tachycardia, Signs/Symptoms:Dyspnea                 and Edema; Risk Factors:Diabetes, Hypertension and Dyslipidemia.  Sonographer:    Johny Chess  RDCS Referring Phys: [id number] [facility] TANNU  Sonographer Comments: Image acquisition challenging due to respiratory motion. IMPRESSIONS  1. Left ventricular ejection fraction, by estimation, is 30 to 35%. The left ventricle has moderately decreased function. The left ventricle demonstrates global hypokinesis. Left ventricular diastolic function could not be evaluated.  2. Right ventricular systolic function is normal. The right ventricular size is normal. There is normal pulmonary artery systolic pressure.  3. Left atrial size was severely dilated.  4. Right atrial size was moderately dilated.  5. The mitral valve is normal in structure. Trivial mitral valve regurgitation. No evidence of mitral stenosis.  6. The aortic valve is tricuspid. Aortic valve regurgitation is not visualized. No aortic stenosis is present.  7. The inferior vena cava is normal in size with greater than 50% respiratory variability, suggesting right atrial pressure of 3 mmHg. FINDINGS  Left Ventricle: Left ventricular ejection fraction, by estimation, is 30 to 35%. The left ventricle has moderately decreased function. The left ventricle demonstrates global hypokinesis. The left ventricular internal cavity size was normal in size. There is no left ventricular hypertrophy. Left ventricular diastolic function could not be evaluated due to atrial fibrillation. Left ventricular diastolic function could not be evaluated. Right Ventricle: The right ventricular size is normal. No increase in right ventricular wall thickness. Right ventricular systolic function is normal. There is normal pulmonary artery systolic pressure. The tricuspid regurgitant velocity is 2.59 m/s, and  with an assumed right atrial pressure of 3 mmHg, the estimated right ventricular systolic pressure is [id number] mmHg. Left Atrium: Left atrial size was severely dilated. Right Atrium: Right atrial size was moderately dilated. Pericardium: There is no evidence of pericardial effusion.  Mitral Valve: The mitral valve is normal in structure. Trivial mitral valve regurgitation. No evidence of mitral valve stenosis. Tricuspid Valve: The tricuspid valve is normal in structure. Tricuspid valve regurgitation is mild . No evidence of tricuspid stenosis. Aortic Valve: The aortic valve is tricuspid. Aortic valve regurgitation is not visualized. No aortic stenosis is present. Pulmonic Valve: The pulmonic valve was normal in structure. Pulmonic valve regurgitation is not visualized. No evidence of pulmonic stenosis. Aorta: The aortic root is normal in size and structure. Venous: The inferior vena cava is normal in size with greater than 50% respiratory variability, suggesting right atrial  pressure of 3 mmHg. IAS/Shunts: No atrial level shunt detected by color flow Doppler.  LEFT VENTRICLE PLAX 2D LVIDd:         5.10 cm LVIDs:         4.20 cm LV PW:         1.00 cm LV IVS:        0.90 cm LVOT diam:     1.80 cm LVOT Area:     2.54 cm  IVC IVC diam: 1.80 cm LEFT ATRIUM             Index        RIGHT ATRIUM           Index LA diam:        4.20 cm 2.20 cm/m   RA Area:     21.40 cm LA Vol (A2C):   79.5 ml 41.56 ml/m  RA Volume:   66.30 ml  34.66 ml/m LA Vol (A4C):   67.0 ml 35.02 ml/m LA Biplane Vol: 74.6 ml 39.00 ml/m   AORTA Ao Root diam: 2.80 cm TRICUSPID VALVE TR Peak grad:   26.8 mmHg TR Vmax:        259.00 cm/s  SHUNTS Systemic Diam: 1.80 cm Skeet Latch MD Electronically signed by Skeet Latch MD Signature Date/Time: 07/27/2022/11:10:33 AM    Final    CT ANGIO CHEST/ABD/PEL FOR DISSECTION W &/OR WO CONTRAST  Result Date: 07/27/2022 CLINICAL DATA:  Acute aortic syndrome suspected. EXAM: CT ANGIOGRAPHY CHEST, ABDOMEN AND PELVIS TECHNIQUE: Non-contrast CT of the chest was initially obtained. Multidetector CT imaging through the chest, abdomen and pelvis was performed using the standard protocol during bolus administration of intravenous contrast. Multiplanar reconstructed images and MIPs  were obtained and reviewed to evaluate the vascular anatomy. RADIATION DOSE REDUCTION: This exam was performed according to the departmental dose-optimization program which includes automated exposure control, adjustment of the mA and/or kV according to patient size and/or use of iterative reconstruction technique. CONTRAST:  139mL OMNIPAQUE IOHEXOL 350 MG/ML SOLN COMPARISON:  CT abdomen pelvis dated 06/18/2021. FINDINGS: CTA CHEST FINDINGS Cardiovascular: Mild cardiomegaly with biatrial dilatation. No pericardial effusion. The thoracic aorta is unremarkable. No pulmonary artery embolus identified. Mediastinum/Nodes: No hilar or mediastinal adenopathy. The esophagus is grossly unremarkable. No mediastinal fluid collection. Lungs/Pleura: No focal consolidation, pleural effusion, or pneumothorax. The central airways are patent. Musculoskeletal: Degenerative changes of the spine. No acute osseous pathology. Review of the MIP images confirms the above findings. CTA ABDOMEN AND PELVIS FINDINGS VASCULAR Aorta: Mild atherosclerotic calcification of the aorta. No aneurysmal dilatation or dissection. No periaortic fluid collection. Celiac: Patent without evidence of aneurysm, dissection, vasculitis or significant stenosis. SMA: Patent without evidence of aneurysm, dissection, vasculitis or significant stenosis. Renals: Both renal arteries are patent without evidence of aneurysm, dissection, vasculitis, fibromuscular dysplasia or significant stenosis. IMA: Patent without evidence of aneurysm, dissection, vasculitis or significant stenosis. Inflow: Mild atherosclerotic calcification of the iliac arteries. No aneurysmal dilatation or dissection. The iliac arteries are patent. Veins: No obvious venous abnormality within the limitations of this arterial phase study. Review of the MIP images confirms the above findings. NON-VASCULAR No intra-abdominal free air or free fluid. Hepatobiliary: The liver is unremarkable. No biliary  dilatation. Several gallstones. No pericholecystic fluid or evidence of acute cholecystitis by CT. Pancreas: Unremarkable. No pancreatic ductal dilatation or surrounding inflammatory changes. Spleen: Normal in size without focal abnormality. Adrenals/Urinary Tract: The adrenal glands unremarkable the kidneys, visualized ureters, and urinary bladder appear unremarkable. Stomach/Bowel: There is  sigmoid diverticulosis and scattered colonic diverticula without active inflammatory changes. There is no bowel obstruction or active inflammation. The appendix is normal. Lymphatic: No adenopathy. Reproductive: Hysterectomy.  No adnexal masses. Other: None Musculoskeletal: Degenerative changes of the left hip and spine. No acute osseous pathology. Review of the MIP images confirms the above findings. IMPRESSION: 1. No acute intrathoracic, abdominal, or pelvic pathology. No aortic dissection or aneurysm. 2. Cholelithiasis. 3. Colonic diverticulosis. No bowel obstruction. Normal appendix. Electronically Signed   By: Anner Crete M.D.   On: 07/27/2022 02:06   DG Chest Port 1 View  Result Date: 07/27/2022 CLINICAL DATA:  Tachycardia EXAM: PORTABLE CHEST 1 VIEW COMPARISON:  07/31/2016 FINDINGS: Increased size of the cardiomediastinal silhouette since 07/31/2016 is at least in part due to differences in technique and patient rotation however is somewhat greater than expected. No focal consolidation, pleural effusion, or pneumothorax. No acute osseous abnormality. IMPRESSION: Increased size of the cardiomediastinal silhouette since 07/31/2016 is at least in part due to differences in technique however is somewhat greater than expected. CTA of the chest is recommended if there is concern for pericardial effusion and/or acute aortic syndrome. These results were called by telephone at the time of interpretation on 07/27/2022 at 12:52 am to provider Quincy Carnes, PA, who verbally acknowledged these results. Electronically Signed    By: Placido Sou M.D.   On: 07/27/2022 00:52   Disposition   Pt is being discharged home today in good condition.  Follow-up Plans & Appointments     Follow-up Information     [facility]. Go in 12 day(s).   Specialty: Cardiology Why: Hospital follow up 08/11/2022 @ 3 pm PLEASE bring a current medication list to appointment FREE valet parking, Entrance C, off [employer] information: [address] [id number] [place] [facility]. Schedule an appointment as soon as possible for a visit in 1 month(s).   Specialty: Neurology Why: stroke clinic Contact information: [place] [place]. Schedule an appointment as soon as possible for a visit in 1 week(s).   Specialty: Rehabilitation Contact information: [address] [place] [id number] [place] [id number] [place]. [facility] Follow up on 08/05/2022.   Specialty: Cardiology Why: Please come in for repeat labs between the hours of 8:30-3:30pm Contact information: [place] [phone]               Discharge Instructions     Amb referral to AFIB Clinic   Complete by: As directed    Ambulatory referral to Neurology   Complete by: As directed    Follow up with stroke clinic NP (Jessica Vanschaick or Cecille Rubin, if both not available, consider Zachery Dauer, or [place]) at [facility] in about 4 weeks. Thanks.   Ambulatory referral to Occupational Therapy   Complete by: As directed    Diet - low sodium heart healthy   Complete by: As directed    Discharge instructions   Complete by: As directed    If you notice any  bleeding such as blood in stool, black tarry stools, blood in urine, nosebleeds or any other unusual bleeding, call your doctor immediately. It is  not normal to have this kind of bleeding while on a blood thinner and usually indicates there is an underlying problem with one of your body systems that needs to be checked out.   For patients with congestive heart failure, we give them these special instructions:  1. Follow a low-salt diet and watch your fluid intake. In general, you should not be taking in more than 2 liters of fluid per day (no more than 8 glasses per day). Some patients are restricted to less than 1.5 liters of fluid per day (no more than 6 glasses per day). This includes sources of water in foods like soup, coffee, tea, milk, etc. 2. Weigh yourself on the same scale at same time of day and keep a log. 3. Call your doctor: (Anytime you feel any of the following symptoms)  - 3-4 pound weight gain in 1-2 days or 2 pounds overnight  - Shortness of breath, with or without a dry hacking cough  - Swelling in the hands, feet or stomach  - If you have to sleep on extra pillows at night in order to breathe   IT IS IMPORTANT TO LET YOUR DOCTOR KNOW EARLY ON IF YOU ARE HAVING SYMPTOMS SO WE CAN HELP YOU!   Increase activity slowly   Complete by: As directed         Discharge Medications   Allergies as of 08/02/2022       Reactions   Janumet [sitagliptin-metformin Hcl] Shortness Of Breath   Januvia [sitagliptin] Shortness Of Breath   Kombiglyze [saxagliptin-metformin Er] Shortness Of Breath   Elemental Sulfur Itching        Medication List     STOP taking these medications    amLODipine 5 MG tablet Commonly known as: NORVASC   aspirin EC 81 MG tablet   atenolol 50 MG tablet Commonly known as: TENORMIN   Pfizer COVID-19 Vac Bivalent injection Generic drug: COVID-19 mRNA bivalent vaccine Proofreader)       TAKE these medications    apixaban 5 MG Tabs  tablet Commonly known as: ELIQUIS Take 1 tablet (5 mg total) by mouth 2 (two) times daily.   digoxin 0.125 MG tablet Commonly known as: LANOXIN Take 1 tablet (0.125 mg total) by mouth daily. Start taking on: August 03, 2022   ergocalciferol 1.25 MG (50000 UT) capsule Commonly known as: VITAMIN D2 Take 50,000 Units by mouth once a week.   furosemide 20 MG tablet Commonly known as: LASIX Take 1 tablet (20 mg total) by mouth daily as needed. What changed: See the new instructions.   metoprolol tartrate 100 MG tablet Commonly known as: LOPRESSOR Take 1 tablet (100 mg total) by mouth 2 (two) times daily.   nitrofurantoin (macrocrystal-monohydrate) 100 MG capsule Commonly known as: Macrobid Take 1 capsule (100 mg total) by mouth 2 (two) times daily for 5 days.   OneTouch Delica Plus Lancet33G Misc   OneTouch Verio test strip Generic drug: glucose blood   rosuvastatin 40 MG tablet Commonly known as: CRESTOR Take 1 tablet (40 mg total) by mouth daily. Start taking on: August 03, 2022 What changed:  medication strength how much to take   sacubitril-valsartan 24-26 MG Commonly known as: ENTRESTO Take 1 tablet by mouth 2 (two) times daily.   spironolactone 25 MG tablet Commonly known as: ALDACTONE Take 0.5 tablets (12.5 mg total) by mouth daily. Start taking on: August 03, 2022           Outstanding Labs/Studies   Dig  level 1/3 BMET at follow up  Duration of Discharge Encounter   Greater than 30 minutes including physician time.  Signed, Reino Bellis, NP 08/02/2022, 5:10 PM

## 2022-08-02 NOTE — Progress Notes (Signed)
Rounding Note    Patient Name: Renee Bradley Date of Encounter: 08/02/2022  [facility] Cardiologist: None   Subjective   Has some left flank pain, but no clear frequency/urgency and no fever/chills. Arrhythmia unaware. No dyspnea walking in the hallway. Rate control remains mediocre.  Inpatient Medications    Scheduled Meds:  apixaban  5 mg Oral BID   digoxin  0.125 mg Oral Daily   metoprolol tartrate  100 mg Oral BID   metoprolol tartrate  25 mg Oral Once   rosuvastatin  40 mg Oral Daily   sacubitril-valsartan  1 tablet Oral BID   Continuous Infusions:  PRN Meds: acetaminophen   Vital Signs    Vitals:   08/01/22 1955 08/02/22 0515 08/02/22 0631 08/02/22 0814  BP: 116/75 109/83    Pulse: 99 (!) 43  (!) 106  Resp: 18 18    Temp: (!) 97.5 F (36.4 C) (!) 97.5 F (36.4 C)    TempSrc: Oral Oral    SpO2: 95% 97%    Weight:   83.4 kg   Height:        Intake/Output Summary (Last 24 hours) at 08/02/2022 1216 Last data filed at 08/02/2022 2094 Gross per 24 hour  Intake 240 ml  Output 750 ml  Net -510 ml      08/02/2022    6:31 AM 07/31/2022    5:01 AM 07/30/2022    3:43 AM  Last 3 Weights  Weight (lbs) 183 lb 14.4 oz 185 lb 8 oz 189 lb 14.4 oz  Weight (kg) 83.416 kg 84.142 kg 86.138 kg      Telemetry    AF w RVR - Personally Reviewed  ECG    No new tracing - Personally Reviewed  Physical Exam  Appears well GEN: No acute distress.   Neck: No JVD Cardiac: irregular, no murmurs, rubs, or gallops.  Respiratory: Clear to auscultation bilaterally. GI: Soft, nontender, non-distended  MS: No edema; No deformity. Neuro:  Nonfocal  Psych: Normal affect   Labs    High Sensitivity Troponin:   Recent Labs  Lab 07/27/22 0031 07/27/22 0231  TROPONINIHS 6 7     Chemistry Recent Labs  Lab 07/29/22 0234 07/30/22 0421 07/31/22 0134 08/01/22 0031 08/02/22 0030  NA  --    < > 144 142 142  K  --    < > 3.8 4.0 3.6  CL  --    < >  111 109 111  CO2  --    < > 20* 21* 17*  GLUCOSE  --    < > 64* 89 99  BUN  --    < > 10 15 17   CREATININE  --    < > 0.94 1.09* 1.06*  CALCIUM  --    < > 9.1 9.1 9.1  MG 2.0  --   --   --   --   GFRNONAA  --    < > >60 53* 55*  ANIONGAP  --    < > 13 12 14    < > = values in this interval not displayed.    Lipids  Recent Labs  Lab 07/29/22 0234  CHOL 144  TRIG 55  HDL 55  LDLCALC 78  CHOLHDL 2.6    Hematology Recent Labs  Lab 07/31/22 0134 08/01/22 0031 08/02/22 0030  WBC 3.9* 3.8* 3.7*  RBC 4.88 5.00 5.14*  HGB 14.2 14.7 15.0  HCT 42.4 43.5 45.2  MCV 86.9 87.0  87.9  MCH 29.1 29.4 29.2  MCHC 33.5 33.8 33.2  RDW 14.8 14.9 15.0  PLT 143* 161 161   Thyroid  Recent Labs  Lab 07/30/22 0421  TSH 1.115    BNP Recent Labs  Lab 07/27/22 0031  BNP 562.3*    DDimer No results for input(s): "DDIMER" in the last 168 hours.   Radiology    No results found.  Cardiac Studies   Echocardiogram 07/27/2022     1. Left ventricular ejection fraction, by estimation, is 30 to 35%. The  left ventricle has moderately decreased function. The left ventricle  demonstrates global hypokinesis. Left ventricular diastolic function could  not be evaluated.   2. Right ventricular systolic function is normal. The right ventricular  size is normal. There is normal pulmonary artery systolic pressure.   3. Left atrial size was severely dilated.   4. Right atrial size was moderately dilated.   5. The mitral valve is normal in structure. Trivial mitral valve  regurgitation. No evidence of mitral stenosis.   6. The aortic valve is tricuspid. Aortic valve regurgitation is not  visualized. No aortic stenosis is present.   7. The inferior vena cava is normal in size with greater than 50%  respiratory variability, suggesting right atrial pressure of 3 mmHg.        Reviewed images of the chest CT angiogram performed for pulmonary embolism.  Even though this was not a gated study,  there is remarkably clear imaging of the proximal left coronary system, including the entire left main, proximal LAD and proximal left circumflex coronary arteries, which do not have any stenoses or visible calcified plaque.  The right coronary artery is not as well-seen due to motion artifact.  There is scanty aortic atherosclerosis.      Patient Profile     76 y.o. female with history of type 2 diabetes mellitus presenting with small embolic stroke and found to have atrial fibrillation with rapid ventricular response and newly decreased left ventricular systolic function EF [id number].   Assessment & Plan    CHF: clinically euvolemic. 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 On entresto and beta blocker. Was on furosemide 20 mg daily prior to admission - may not need at this time, but can use PRN. Ideally would also start SGLT2i, but want to make sure no active UTI first. Start very low dose spironolactone due to BP. Recheck EF in 2-3 months. If no EF improvement may need coronary angiography. AFib w RVR: increase metoprolol to 100 mg twice daily. Additional 25 mg ordered now, if rate control is good this PM, may be ready for DC later today, but probably better to wait until tomorrow to see how BP behaves. Will need dig level and BMET in several days. Plan DCCV after 3 weeks of anticoagulation. L flank pain: check UA, but likely musculoskeletal.  For questions or updates, please contact [place] Please consult [email or web address] for contact info under        Signed, Sanda Klein, MD  08/02/2022, 12:16 PM

## 2022-08-02 NOTE — Progress Notes (Signed)
Mobility Specialist - Progress Note   08/02/22 1157  Mobility  Activity Ambulated with assistance in hallway  Level of Assistance Standby assist, set-up cues, supervision of patient - no hands on  Assistive Device None  Distance Ambulated (ft) 300 ft  Activity Response Tolerated well  Mobility Referral Yes  $Mobility charge 1 Mobility    Pt received standing in room agreeable to hallway ambulation. No complaints throughout, no physical assistance needed. Left in room w/ all needs met.   [place] Specialist Please contact via SecureChat or Rehab office at [phone]

## 2022-08-03 DIAGNOSIS — I5021 Acute systolic (congestive) heart failure: Secondary | ICD-10-CM

## 2022-08-04 ENCOUNTER — Telehealth: Payer: Self-pay | Admitting: Cardiovascular Disease

## 2022-08-04 ENCOUNTER — Telehealth: Payer: Self-pay | Admitting: *Deleted

## 2022-08-04 ENCOUNTER — Other Ambulatory Visit: Payer: Self-pay

## 2022-08-04 MED ORDER — FUROSEMIDE 20 MG PO TABS: 20.0000 mg | ORAL_TABLET | Freq: Every day | ORAL | 3 refills | Status: DC | PRN

## 2022-08-04 NOTE — Progress Notes (Signed)
  Care Coordination Note  08/04/2022 Name: CHRYSTIE HAGWOOD MRN: [id number] DOB: [birth year]  [name] is a 77 y.o. year old female who is a primary care patient of Glendale Chard, MD and is actively engaged with the care management team. I reached out to [place] by phone today to assist with scheduling a follow up visit with the RN Case Manager  Follow up plan: Telephone appointment with care management team member scheduled for:08/10/22  [place]: [phone]

## 2022-08-04 NOTE — Telephone Encounter (Signed)
*  STAT* If patient is at the pharmacy, call can be transferred to refill team.   1. Which medications need to be refilled? (please list name of each medication and dose if known) furosemide (LASIX) 20 MG tablet  2. Which pharmacy/location (including street and city if local pharmacy) is medication to be sent to? [id number] - , [place] - [address]  3. Do they need a 30 day or 90 day supply? 90 . 

## 2022-08-04 NOTE — Patient Outreach (Signed)
08/04/22  Renee Bradley [birth year] [id number]   [name] [ACO] Patient:  Medicare ACO REACH  Primary Care Provider:  Glendale Chard, MD  Post hospital notification follow up with team  Patient is currently active with [place] [facility] for chronic disease management services.  Patient has been engaged by a [place].  Our community based plan of care has focused on disease management and community resource support.     Plan:  Notified [facility] RNCM of admission and transition back home.   Of note, [facility] Care Management services does not replace or interfere with any services that are needed or arranged by inpatient [facility] care management team.   For additional questions or referrals please contact:  Natividad Brood, RN BSN [place]  [phone] business mobile phone Toll free office [phone]  *[place]  [phone] Fax number: [phone] [email or web address]

## 2022-08-04 NOTE — Progress Notes (Signed)
  Care Coordination Note  08/04/2022 Name: Renee Bradley MRN: [id number] DOB: Dec 03, 1945  [name] is a 77 y.o. year old female who is a primary care patient of Glendale Chard, MD and is actively engaged with the care management team. I reached out to [place] by phone today to assist with scheduling a follow up visit with the RN Case Manager  Follow up plan: Unsuccessful telephone outreach attempt made.   [place]  Direct Dial: [phone]

## 2022-08-05 ENCOUNTER — Telehealth: Payer: Self-pay

## 2022-08-05 ENCOUNTER — Other Ambulatory Visit: Payer: Self-pay

## 2022-08-05 DIAGNOSIS — Z79899 Other long term (current) drug therapy: Secondary | ICD-10-CM

## 2022-08-05 NOTE — Telephone Encounter (Signed)
Transition Care Management Follow-up Telephone Call Date of discharge and from where: 08/02/2022 [place]  How have you been since you were released from the hospital? Pt states she feels better, she is trying to stay active & watch her diet. She is to start rehab on 1/10. Any questions or concerns? No  Items Reviewed: Did the pt receive and understand the discharge instructions provided? Yes  Medications obtained and verified? Yes  Other? Yes  Any new allergies since your discharge? No  Dietary orders reviewed? Yes Do you have support at home? Yes   Home Care and Equipment/Supplies: Were home health services ordered? no If so, what is the name of the agency? N/a  Has the agency set up a time to come to the patient's home? no Were any new equipment or medical supplies ordered?  No What is the name of the medical supply agency? N/a Were you able to get the supplies/equipment? no Do you have any questions related to the use of the equipment or supplies? No  Functional Questionnaire: (I = Independent and D = Dependent) ADLs: i  Bathing/Dressing- i  Meal Prep- i  Eating- i  Maintaining continence- i  Transferring/Ambulation- i  Managing Meds- i  Follow up appointments reviewed:  PCP Hospital f/u appt confirmed? Yes  Scheduled to see robyn sanders on n/a @ n/a. [facility] f/u appt confirmed? Yes  Scheduled to see cardiologist  on 08/11/2021  @ office. Are transportation arrangements needed? No  If their condition worsens, is the pt aware to call PCP or go to the Emergency Dept.? Yes Was the patient provided with contact information for the PCP's office or ED? Yes Was to pt encouraged to call back with questions or concerns? Yes

## 2022-08-06 ENCOUNTER — Telehealth: Payer: Self-pay | Admitting: Cardiovascular Disease

## 2022-08-06 ENCOUNTER — Ambulatory Visit (INDEPENDENT_AMBULATORY_CARE_PROVIDER_SITE_OTHER): Payer: Medicare Other | Admitting: Internal Medicine

## 2022-08-06 ENCOUNTER — Encounter: Payer: Self-pay | Admitting: Internal Medicine

## 2022-08-06 VITALS — BP 122/68 | HR 69 | Temp 97.9°F | Ht 62.0 in | Wt 183.0 lb

## 2022-08-06 DIAGNOSIS — D6869 Other thrombophilia: Secondary | ICD-10-CM

## 2022-08-06 DIAGNOSIS — E6609 Other obesity due to excess calories: Secondary | ICD-10-CM | POA: Diagnosis not present

## 2022-08-06 DIAGNOSIS — I4891 Unspecified atrial fibrillation: Secondary | ICD-10-CM

## 2022-08-06 DIAGNOSIS — Z6833 Body mass index (BMI) 33.0-33.9, adult: Secondary | ICD-10-CM | POA: Diagnosis not present

## 2022-08-06 DIAGNOSIS — I5020 Unspecified systolic (congestive) heart failure: Secondary | ICD-10-CM | POA: Diagnosis not present

## 2022-08-06 DIAGNOSIS — N3000 Acute cystitis without hematuria: Secondary | ICD-10-CM

## 2022-08-06 DIAGNOSIS — I1310 Hypertensive heart and chronic kidney disease without heart failure, with stage 1 through stage 4 chronic kidney disease, or unspecified chronic kidney disease: Secondary | ICD-10-CM | POA: Diagnosis not present

## 2022-08-06 DIAGNOSIS — E1122 Type 2 diabetes mellitus with diabetic chronic kidney disease: Secondary | ICD-10-CM

## 2022-08-06 DIAGNOSIS — N182 Chronic kidney disease, stage 2 (mild): Secondary | ICD-10-CM

## 2022-08-06 DIAGNOSIS — E7800 Pure hypercholesterolemia, unspecified: Secondary | ICD-10-CM

## 2022-08-06 DIAGNOSIS — Z79899 Other long term (current) drug therapy: Secondary | ICD-10-CM | POA: Diagnosis not present

## 2022-08-06 DIAGNOSIS — I5021 Acute systolic (congestive) heart failure: Secondary | ICD-10-CM

## 2022-08-06 DIAGNOSIS — I639 Cerebral infarction, unspecified: Secondary | ICD-10-CM

## 2022-08-06 DIAGNOSIS — E785 Hyperlipidemia, unspecified: Secondary | ICD-10-CM

## 2022-08-06 DIAGNOSIS — I6930 Unspecified sequelae of cerebral infarction: Secondary | ICD-10-CM

## 2022-08-06 NOTE — Progress Notes (Signed)
Renee Bradley,acting as a Education administrator for Renee Greenland, MD.,have documented all relevant documentation on the behalf of Renee Greenland, MD,as directed by  Renee Greenland, MD while in the presence of Renee Greenland, MD.    Subjective:     Patient ID: Renee Bradley , female    DOB: Dec 29, 1945 , 77 y.o.   MRN: [id number]   Chief Complaint  Patient presents with   hospital f/u    HPI  Patient presents today for a hospital f/u. She was admitted to [facility] on 07/27/22 and discharged on 08/02/22. She presented 12/23 with numbness and clumsiness in left arm and was found to have new onset atrial fibrillation with RVR. CTA head and neck negative for large vessel occlusion. 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 She was seen by Neuro and anticoagulation recommended. 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 Given CVA at presentation, she was treated with rate control strategy with plan for outpatient cardioversion in several weeks. Echo: EF 30-35%, global HK, RV okay, left atrium not severely enlarged (reviewed with Dr. Daniel Nones).  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 She was diuresed w/o complication. Hospital course further complicated by UTI for which she was treated with abx.  She was discharged in stable condition on 08/02/22.   Since discharge, she states she has some SOB w/ exertion. She also feels fatigued. Admits she has not been eating/drinking much. She is concerned about sodium/fluid intake.       Past Medical History:  Diagnosis Date   Asthma    Chest pressure 07/13/2012   Diabetes mellitus without complication (HCC)    DM (diabetes mellitus) ([place]) 07/13/2012   HTN (hypertension) 07/13/2012   Hypercholesterolemia    Hypertension    SOB (shortness of breath) 07/13/2012   Tachycardia, unspecified, "fluttering" 07/13/2012     Family History  Problem Relation Age of Onset    Heart attack Mother    Diabetes type II Mother    Stroke Mother    Alzheimer's disease Father    Diabetes type II Brother      Current Outpatient Medications:    furosemide (LASIX) 20 MG tablet, Take 1 tablet (20 mg total) by mouth daily as needed. (Patient taking differently: Take 20 mg by mouth daily as needed for fluid or edema.), Disp: 90 tablet, Rfl: 3   Lancets (ONETOUCH DELICA PLUS [id number]) MISC, , Disp: , Rfl:    ONETOUCH VERIO test strip, , Disp: , Rfl:    apixaban (ELIQUIS) 5 MG TABS tablet, Take 1 tablet (5 mg total) by mouth 2 (two) times daily., Disp: 60 tablet, Rfl: 3   digoxin (LANOXIN) 0.125 MG tablet, Take 1 tablet (0.125 mg total) by mouth daily., Disp: 30 tablet, Rfl: 2   metoprolol tartrate (LOPRESSOR) 100 MG tablet, Take 1 tablet (100 mg total) by mouth 2 (two) times daily., Disp: 180 tablet, Rfl: 2   rosuvastatin (CRESTOR) 40 MG tablet, Take 1 tablet (40 mg total) by mouth daily., Disp: 90 tablet, Rfl: 2   sacubitril-valsartan (ENTRESTO) 24-26 MG, Take 1 tablet by mouth 2 (two) times daily., Disp: 180 tablet, Rfl: 3   spironolactone (ALDACTONE) 25 MG tablet, Take 0.5 tablets (12.5 mg total) by mouth daily., Disp: 45 tablet, Rfl: 3   Allergies  Allergen Reactions   Janumet [Sitagliptin-Metformin Hcl] Shortness Of Breath   Januvia [Sitagliptin] Shortness Of Breath   Kombiglyze [Saxagliptin-Metformin Er] Shortness Of Breath   Sulfa  Antibiotics Shortness Of Breath   Elemental Sulfur Itching     Review of Systems  Constitutional:  Positive for fatigue.  Eyes: Negative.   Respiratory: Negative.    Cardiovascular: Negative.   Gastrointestinal: Negative.   Musculoskeletal: Negative.   Skin: Negative.   Neurological: Negative.   Psychiatric/Behavioral: Negative.       Today's Vitals   08/06/22 1607  BP: 122/68  Pulse: 69  Temp: 97.9 F (36.6 C)  Weight: 183 lb (83 kg)  Height: 5' 2"$  (1.575 m)  PainSc: 0-No pain   Body mass index is 33.47 kg/m.  Wt  Readings from Last 3 Encounters:  09/02/22 174 lb (78.9 kg)  08/24/22 180 lb (81.6 kg)  08/11/22 184 lb (83.5 kg)     Objective:  Physical Exam Vitals and nursing note reviewed.  Constitutional:      Appearance: Normal appearance.  HENT:     Head: Normocephalic and atraumatic.     Nose:     Comments: Masked     Mouth/Throat:     Comments: Masked  Eyes:     Extraocular Movements: Extraocular movements intact.  Cardiovascular:     Rate and Rhythm: Normal rate. Rhythm irregular.     Heart sounds: Normal heart sounds.  Pulmonary:     Effort: Pulmonary effort is normal.     Breath sounds: Normal breath sounds.  Skin:    General: Skin is warm.  Neurological:     General: No focal deficit present.     Mental Status: She is alert.  Psychiatric:        Mood and Affect: Mood normal.        Behavior: Behavior normal.      Assessment And Plan:     1. Atrial fibrillation with RVR ([place]) Comments: New onset. TCM PERFORMED. A MEMBER OF THE CLINICAL TEAM SPOKE WITH THE PATIENT UPON DISCHARGE. DISCHARGE SUMMARY WAS REVIEWED IN FULL DETAIL DURING THE VISIT. MEDS RECONCILED AND COMPARED TO DISCHARGE MEDS. MEDICATION LIST WAS UPDATED AND REVIEWED WITH THE PATIENT. GREATER THAN 50% FACE TO FACE TIME WAS SPENT IN COUNSELING AND COORDINATION OF CARE. ALL QUESTIONS WERE ANSWERED TO THE SATISFACTION OF THE PATIENT. She is now rate controlled and properly anticoagulated.  She will c/w metoprolol and Eliquis. Plan for cardioversion in 2-3 weeks.  She is reminded to avoid caffeinated beverages.  - BMP8+EGFR - Digoxin  2. HFrEF (heart failure with reduced ejection fraction) (HCC) Comments: EF 30-35%. She will c/w spironolactone, Entresto, and metoprolol. Encouraged to follow low sodium diet.  3. Cerebrovascular accident (CVA), unspecified mechanism ([place]) Comments: MRI revealed acute infarction in the right frontoparietal junction region with edema and some petechial bleeding.She will c/w Eliquis,  keep Neuro f/u.  4. Pure hypercholesterolemia Comments: Chronic, she will c/w rosuvastatin. LDL goal <70. Importance of dietary/medication compliance was d/w patient.  5. Type 2 diabetes mellitus with stage 2 chronic kidney disease, without long-term current use of insulin (HCC) Comments: Chronic, unable to add SGLT2 inh due to recent UTI. Encouraged to comply w/ dietary recommendations.  6. Acquired thrombophilia ([place]) Comments: She is anticoagulated w/ Eliquis due to recent diagnosis of afib.  7. Class 1 obesity due to excess calories with serious comorbidity and body mass index (BMI) of 33.0 to 33.9 in adult Comments: She is encouraged to aim for at least 150 minutes of exercise/week, while striving for BMI<30 to decrease cardiac risk. Consider PREP in future.  8. Drug therapy - BMP8+EGFR - Digoxin   Patient was  given opportunity to ask questions. Patient verbalized understanding of the plan and was able to repeat key elements of the plan. All questions were answered to their satisfaction.   I, Renee Greenland, MD, have reviewed all documentation for this visit. The documentation on 09/13/22 for the exam, diagnosis, procedures, and orders are all accurate and complete.   IF YOU HAVE BEEN REFERRED TO A SPECIALIST, IT MAY TAKE 1-2 WEEKS TO SCHEDULE/PROCESS THE REFERRAL. IF YOU HAVE NOT HEARD FROM US/SPECIALIST IN TWO WEEKS, PLEASE GIVE [place] A CALL AT [phone] X [phone].   THE PATIENT IS ENCOURAGED TO PRACTICE SOCIAL DISTANCING DUE TO THE COVID-19 PANDEMIC.

## 2022-08-06 NOTE — Telephone Encounter (Signed)
Patient stated she takes Afib medication and she ate peanut butter on a slice of bread this morning and felt discomfort in her chest and is concerned peanut butter may interact with her medication.  Patient stated she took her medication at 6:30 am

## 2022-08-06 NOTE — Telephone Encounter (Signed)
Attempted to call pt back. Phone rang but no voicemail set up.

## 2022-08-07 LAB — BMP8+EGFR
BUN/Creatinine Ratio: 15 (ref 12–28)
BUN: 23 mg/dL (ref 8–27)
CO2: 17 mmol/L — ABNORMAL LOW (ref 20–29)
Calcium: 10 mg/dL (ref 8.7–10.3)
Chloride: 105 mmol/L (ref 96–106)
Creatinine, Ser: 1.57 mg/dL — ABNORMAL HIGH (ref 0.57–1.00)
Glucose: 89 mg/dL (ref 70–99)
Potassium: 4.6 mmol/L (ref 3.5–5.2)
Sodium: 139 mmol/L (ref 134–144)
eGFR: 34 mL/min/{1.73_m2} — ABNORMAL LOW (ref >59)

## 2022-08-07 LAB — DIGOXIN LEVEL: Digoxin, Serum: 1.2 ng/mL — ABNORMAL HIGH (ref 0.5–0.9)

## 2022-08-07 NOTE — Telephone Encounter (Signed)
Spoke to patient . She states she does not have any issues today. Thanked  Rn for calling.  RN

## 2022-08-08 LAB — DIGOXIN LEVEL: Digoxin, Serum: 0.9 ng/mL (ref 0.5–0.9)

## 2022-08-10 ENCOUNTER — Ambulatory Visit: Payer: Self-pay

## 2022-08-10 NOTE — Patient Outreach (Signed)
  Care Coordination   08/10/2022 Name: Renee Bradley MRN: [id number] DOB: 1945/11/08   Care Coordination Outreach Attempts:  An unsuccessful telephone outreach was attempted for a scheduled appointment today.  Follow Up Plan:  Additional outreach attempts will be made to offer the patient care coordination information and services.   Encounter Outcome:  No Answer   Care Coordination Interventions:  No, not indicated    Barb Merino, RN, BSN, CCM Care Management Coordinator [facility] Care Management  Direct Phone: [phone]

## 2022-08-11 ENCOUNTER — Other Ambulatory Visit (HOSPITAL_COMMUNITY): Payer: Self-pay | Admitting: *Deleted

## 2022-08-11 ENCOUNTER — Ambulatory Visit (HOSPITAL_COMMUNITY)
Admission: RE | Admit: 2022-08-11 | Discharge: 2022-08-11 | Disposition: A | Discharge status: Home / Self Care | Payer: Medicare Other | Source: Ambulatory Visit | Attending: Physician Assistant | Admitting: Physician Assistant

## 2022-08-11 ENCOUNTER — Encounter (HOSPITAL_COMMUNITY): Payer: Self-pay

## 2022-08-11 ENCOUNTER — Telehealth (HOSPITAL_COMMUNITY): Payer: Self-pay | Admitting: *Deleted

## 2022-08-11 VITALS — BP 110/80 | HR 74 | Wt 184.0 lb

## 2022-08-11 DIAGNOSIS — Z7901 Long term (current) use of anticoagulants: Secondary | ICD-10-CM | POA: Diagnosis not present

## 2022-08-11 DIAGNOSIS — I10 Essential (primary) hypertension: Secondary | ICD-10-CM | POA: Diagnosis not present

## 2022-08-11 DIAGNOSIS — I639 Cerebral infarction, unspecified: Secondary | ICD-10-CM

## 2022-08-11 DIAGNOSIS — Z87440 Personal history of urinary (tract) infections: Secondary | ICD-10-CM | POA: Diagnosis not present

## 2022-08-11 DIAGNOSIS — I4811 Longstanding persistent atrial fibrillation: Secondary | ICD-10-CM

## 2022-08-11 DIAGNOSIS — N390 Urinary tract infection, site not specified: Secondary | ICD-10-CM | POA: Diagnosis not present

## 2022-08-11 DIAGNOSIS — I5020 Unspecified systolic (congestive) heart failure: Secondary | ICD-10-CM | POA: Diagnosis not present

## 2022-08-11 DIAGNOSIS — E119 Type 2 diabetes mellitus without complications: Secondary | ICD-10-CM | POA: Insufficient documentation

## 2022-08-11 DIAGNOSIS — R200 Anesthesia of skin: Secondary | ICD-10-CM | POA: Insufficient documentation

## 2022-08-11 DIAGNOSIS — E669 Obesity, unspecified: Secondary | ICD-10-CM | POA: Insufficient documentation

## 2022-08-11 DIAGNOSIS — Z79899 Other long term (current) drug therapy: Secondary | ICD-10-CM

## 2022-08-11 DIAGNOSIS — I110 Hypertensive heart disease with heart failure: Secondary | ICD-10-CM | POA: Insufficient documentation

## 2022-08-11 DIAGNOSIS — I5043 Acute on chronic combined systolic (congestive) and diastolic (congestive) heart failure: Secondary | ICD-10-CM

## 2022-08-11 DIAGNOSIS — I4891 Unspecified atrial fibrillation: Secondary | ICD-10-CM | POA: Insufficient documentation

## 2022-08-11 DIAGNOSIS — Z760 Encounter for issue of repeat prescription: Secondary | ICD-10-CM | POA: Insufficient documentation

## 2022-08-11 MED ORDER — METOPROLOL TARTRATE 100 MG PO TABS: 100.0000 mg | ORAL_TABLET | Freq: Two times a day (BID) | ORAL | 2 refills | Status: DC

## 2022-08-11 MED ORDER — SACUBITRIL-VALSARTAN 24-26 MG PO TABS: 1.0000 | ORAL_TABLET | Freq: Two times a day (BID) | ORAL | 3 refills | Status: DC

## 2022-08-11 MED ORDER — ROSUVASTATIN CALCIUM 40 MG PO TABS: 40.0000 mg | ORAL_TABLET | Freq: Every day | ORAL | 2 refills | Status: DC

## 2022-08-11 MED ORDER — APIXABAN 5 MG PO TABS: 5.0000 mg | ORAL_TABLET | Freq: Two times a day (BID) | ORAL | 3 refills | Status: DC

## 2022-08-11 MED ORDER — DIGOXIN 125 MCG PO TABS: 0.1250 mg | ORAL_TABLET | Freq: Every day | ORAL | 2 refills | Status: DC

## 2022-08-11 MED ORDER — SPIRONOLACTONE 25 MG PO TABS: 12.5000 mg | ORAL_TABLET | Freq: Every day | ORAL | 3 refills | Status: DC

## 2022-08-11 NOTE — Progress Notes (Signed)
HEART & VASCULAR TRANSITION OF CARE CONSULT NOTE     Referring Physician: Primary Care: Primary Cardiologist:  HPI: Referred to clinic by Dr. Royann Shivers with [facility] for heart failure consultation. 77 y.o. female w/ history of diet-controlled DM, obesity and HTN.  Presented 12/23 with new onset atrial fibrillation with RVR and numbness and clumsiness in her left arm. CTA head and neck negative for large vessel occlusion. 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 She was seen by Neuro and anticoagulation recommended. 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 Given CVA at presentation, she was treated with rate control strategy with plan for outpatient cardioversion in several weeks. Echo: EF 30-35%, global HK, RV okay, left atrium not severely enlarged (reviewed with Dr. Gasper Lloyd).  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 She was diuresed and started on GDMT.  Course further c/b UTI for which she was treated with abx.  Patient here today for hospital follow-up. She has been doing well since discharge. Notes a little bit of dyspnea with activity. No orthopnea, PND or lower extremity edema. Weight has been fluctuating between 172-182 lb but weighing at different times of day fully dressed. She is independent in ADLs but mobility limited by knee pain from a prior injury. Taking all meds as prescribed except she has been taking lasix every day instead of PRN. Has not been eating much, afraid to go over sodium restriction. She has a lot of questions about how much sodium and fluids she can consume in a day.   Review of Systems: [y] = yes, [ ]  = no   General: Weight gain [ ] ; Weight loss [ ] ; Anorexia [ ] ; Fatigue [ ] ; Fever [ ] ; Chills [ ] ; Weakness [ ]   Cardiac: Chest pain/pressure [ ] ; Resting SOB [ ] ; Exertional SOB [Y ]; Orthopnea [ ] ; Pedal Edema [ ] ; Palpitations [ ] ; Syncope [ ] ; Presyncope [ ] ;  Paroxysmal nocturnal dyspnea[ ]   Pulmonary: Cough [ ] ; Wheezing[ ] ; Hemoptysis[ ] ; Sputum [ ] ; Snoring [ ]   GI: Vomiting[ ] ; Dysphagia[ ] ; Melena[ ] ; Hematochezia [ ] ; Heartburn[ ] ; Abdominal pain [ ] ; Constipation [ ] ; Diarrhea [ ] ; BRBPR [ ]   GU: Hematuria[ ] ; Dysuria [ ] ; Nocturia[ ]   Vascular: Pain in legs with walking [ ] ; Pain in feet with lying flat [ ] ; Non-healing sores [ ] ; Stroke [ ] ; TIA [ ] ; Slurred speech [ ] ;  Neuro: Headaches[ ] ; Vertigo[ ] ; Seizures[ ] ; Paresthesias[ ] ;Blurred vision [ ] ; Diplopia [ ] ; Vision changes [ ]   Ortho/Skin: Arthritis [ ] ; Joint pain [ ] ; Muscle pain [ ] ; Joint swelling [ ] ; Back Pain [ ] ; Rash [ ]   Psych: Depression[ ] ; Anxiety[ ]   Heme: Bleeding problems [ ] ; Clotting disorders [ ] ; Anemia [ ]   Endocrine: Diabetes [Y ]; Thyroid dysfunction[ ]    Past Medical History:  Diagnosis Date   Asthma    Chest pressure 07/13/2012   Diabetes mellitus without complication (HCC)    DM (diabetes mellitus) (HCC) 07/13/2012   HTN (hypertension) 07/13/2012   Hypercholesterolemia    Hypertension    SOB (shortness of breath) 07/13/2012   Tachycardia, unspecified, "fluttering" 07/13/2012    Current Outpatient Medications  Medication Sig Dispense Refill   apixaban (ELIQUIS) 5 MG TABS tablet Take 1 tablet (5 mg total) by mouth 2 (two) times daily. 60 tablet 3   digoxin (LANOXIN) 0.125 MG tablet Take 1 tablet (0.125 mg total) by  mouth daily. 30 tablet 1   furosemide (LASIX) 20 MG tablet Take 1 tablet (20 mg total) by mouth daily as needed. 90 tablet 3   Lancets (ONETOUCH DELICA PLUS LANCET33G) MISC      metoprolol tartrate (LOPRESSOR) 100 MG tablet Take 1 tablet (100 mg total) by mouth 2 (two) times daily. 180 tablet 0   ONETOUCH VERIO test strip      rosuvastatin (CRESTOR) 40 MG tablet Take 1 tablet (40 mg total) by mouth daily. 90 tablet 0   sacubitril-valsartan (ENTRESTO) 24-26 MG Take 1 tablet by mouth 2 (two) times daily. 60 tablet 3   spironolactone  (ALDACTONE) 25 MG tablet Take 0.5 tablets (12.5 mg total) by mouth daily. 45 tablet 0   ergocalciferol (VITAMIN D2) 50000 units capsule Take 50,000 Units by mouth once a week. (Patient not taking: Reported on 08/11/2022)     No current facility-administered medications for this encounter.    Allergies  Allergen Reactions   Janumet [Sitagliptin-Metformin Hcl] Shortness Of Breath   Januvia [Sitagliptin] Shortness Of Breath   Kombiglyze [Saxagliptin-Metformin Er] Shortness Of Breath   Elemental Sulfur Itching      Social History   Socioeconomic History   Marital status: Married    Spouse name: Not on file   Number of children: 2   Years of education: Not on file   Highest education level: Not on file  Occupational History   Occupation: retired  Tobacco Use   Smoking status: Former    Years: 2.00    Types: Cigarettes    Quit date: 05/13/1983    Years since quitting: 39.2   Smokeless tobacco: Never  Vaping Use   Vaping Use: Never used  Substance and Sexual Activity   Alcohol use: No   Drug use: No   Sexual activity: Yes  Other Topics Concern   Not on file  Social History Narrative   Not on file   Social Determinants of Health   Financial Resource Strain: Low Risk  (07/29/2022)   Overall Financial Resource Strain (CARDIA)    Difficulty of Paying Living Expenses: Not hard at all  Food Insecurity: No Food Insecurity (07/27/2022)   Hunger Vital Sign    Worried About Running Out of Food in the Last Year: Never true    Ran Out of Food in the Last Year: Never true  Transportation Needs: No Transportation Needs (07/27/2022)   PRAPARE - Administrator, Civil Service (Medical): No    Lack of Transportation (Non-Medical): No  Physical Activity: Inactive (04/23/2022)   Exercise Vital Sign    Days of Exercise per Week: 0 days    Minutes of Exercise per Session: 0 min  Stress: No Stress Concern Present (04/23/2022)   [employer] of Occupational Health -  Occupational Stress Questionnaire    Feeling of Stress : Not at all  Social Connections: Not on file  Intimate Partner Violence: Not At Risk (07/27/2022)   Humiliation, Afraid, Rape, and Kick questionnaire    Fear of Current or Ex-Partner: No    Emotionally Abused: No    Physically Abused: No    Sexually Abused: No      Family History  Problem Relation Age of Onset   Heart attack Mother    Diabetes type II Mother    Stroke Mother    Alzheimer's disease Father    Diabetes type II Brother     Vitals:   08/11/22 1420  BP: 110/80  Pulse: 74  SpO2:  97%  Weight: 83.5 kg (184 lb)    PHYSICAL EXAM: General:  Well appearing.  HEENT: normal Neck: supple. no JVD. Carotids 2+ bilat; no bruits.  Cor: PMI nondisplaced. Irregular rhythm. No rubs, gallops or murmurs. Lungs: clear Abdomen: soft, nontender, nondistended.  Extremities: no cyanosis, clubbing, rash, edema Neuro: alert & oriented x 3, cranial nerves grossly intact. moves all 4 extremities w/o difficulty. Affect pleasant.  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 Plan to repeat echo once sinus rhythm restored. If no recovery in LV function, will need ischemic workup NYHA II/early III GDMT  Diuretic-Taking lasix 20 mg daily (Prescribed PRN), Scr recently 1.06>1.5 last week. Recommended she take lasix only as needed. Repeat labs next week. BB-Continue metoprolol tartrate 100 mg BID for rate control (switch to metoprolol succinate or coreg post cardioversion) Ace/ARB/ARNI-Entresto 24/26 mg BID MRA-Spiro 12.5 mg daily SGLT2i-No, recent UTI, consider next visit. No hx of recurrent UTI -Continue digoxin 0.125 mg daily, dig level 0.9 on 01/03 and 1.2 on 01/04. Had taken digoxin both days. Has labs next  week. Instructed to hold digoxin dose that am. -Did not titrate meds further today d/t AKI and volume depletion -CMET, digoxin level, CBC and BNP next week -She is wondering if some of her cardiac meds may be causing "weird taste". Will review with HF TOC PharmD.  Newly diagnosed atrial fibrillation -Rate now controlled with metoprolol tartrate -On Eliquis 5 BID. Reports compliance -Arrange TEE/DCCV in next couple of weeks -Hopefully have a reasonable chance of maintaining SR. Left atrium does not appear significantly enlarged as reported on echo (reviewed with Dr. Gasper Lloyd).  HTN BP stable. 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 Seen by Neurology.  -Referred to Neuro follow-up -No residual deficits  Sent med refills for all HF meds to [facility]  Referred to HFSW (PCP, Medications, Transportation, ETOH Abuse, Drug Abuse, Insurance, Financial ): No Refer to Pharmacy: Yes, reviewed meds for side effects Refer to Home Health: No Refer to Advanced Heart Failure Clinic: Yes Refer to General Cardiology: No  Follow up  TEE/DCCV with Dr. Gasper Lloyd in 2-3 weeks, f/u Dr. Gasper Lloyd scheduled 09/22/22

## 2022-08-11 NOTE — Telephone Encounter (Signed)
Call attempted to confirm HV TOC appt 2 pm on 08/11/22. HIPPA appropriate VM left with callback number.    Earnestine Leys, BSN, Clinical cytogeneticist Only

## 2022-08-11 NOTE — H&P (View-Only) (Signed)
HEART & VASCULAR TRANSITION OF CARE CONSULT NOTE     Referring Physician: Dr. Royann Shivers Primary Care: Dr. Allyne Gee Primary Cardiologist: Establishing  HPI: Referred to clinic by Dr. Royann Shivers with [facility] for heart failure consultation. 77 y.o. female w/ history of diet-controlled DM, obesity and HTN.  Presented 12/23 with new onset atrial fibrillation with RVR and numbness and clumsiness in her left arm. CTA head and neck negative for large vessel occlusion. 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 She was seen by Neuro and anticoagulation recommended. 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 Given CVA at presentation, she was treated with rate control strategy with plan for outpatient cardioversion in several weeks. Echo: EF 30-35%, global HK, RV okay, left atrium not severely enlarged (reviewed with Dr. Gasper Lloyd).  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 She was diuresed and started on GDMT.  Course further c/b UTI for which she was treated with abx.  Patient here today for hospital follow-up. She has been doing well since discharge. Notes a little bit of dyspnea with activity. No orthopnea, PND or lower extremity edema. Weight has been fluctuating between 172-182 lb but weighing at different times of day fully dressed. She is independent in ADLs but mobility limited by knee pain from a prior injury. Taking all meds as prescribed except she has been taking lasix every day instead of PRN. Has not been eating much, afraid to go over sodium restriction. She has a lot of questions about how much sodium and fluids she can consume in a day.   Review of Systems: [y] = yes, [ ]  = no   General: Weight gain [ ] ; Weight loss [ ] ; Anorexia [ ] ; Fatigue [ ] ; Fever [ ] ; Chills [ ] ; Weakness [ ]   Cardiac: Chest pain/pressure [ ] ; Resting SOB [ ] ; Exertional SOB [Y ]; Orthopnea [ ] ; Pedal Edema [ ] ;  Palpitations [ ] ; Syncope [ ] ; Presyncope [ ] ; Paroxysmal nocturnal dyspnea[ ]   Pulmonary: Cough [ ] ; Wheezing[ ] ; Hemoptysis[ ] ; Sputum [ ] ; Snoring [ ]   GI: Vomiting[ ] ; Dysphagia[ ] ; Melena[ ] ; Hematochezia [ ] ; Heartburn[ ] ; Abdominal pain [ ] ; Constipation [ ] ; Diarrhea [ ] ; BRBPR [ ]   GU: Hematuria[ ] ; Dysuria [ ] ; Nocturia[ ]   Vascular: Pain in legs with walking [ ] ; Pain in feet with lying flat [ ] ; Non-healing sores [ ] ; Stroke [ ] ; TIA [ ] ; Slurred speech [ ] ;  Neuro: Headaches[ ] ; Vertigo[ ] ; Seizures[ ] ; Paresthesias[ ] ;Blurred vision [ ] ; Diplopia [ ] ; Vision changes [ ]   Ortho/Skin: Arthritis [ ] ; Joint pain [ ] ; Muscle pain [ ] ; Joint swelling [ ] ; Back Pain [ ] ; Rash [ ]   Psych: Depression[ ] ; Anxiety[ ]   Heme: Bleeding problems [ ] ; Clotting disorders [ ] ; Anemia [ ]   Endocrine: Diabetes [Y ]; Thyroid dysfunction[ ]    Past Medical History:  Diagnosis Date   Asthma    Chest pressure 07/13/2012   Diabetes mellitus without complication (HCC)    DM (diabetes mellitus) (HCC) 07/13/2012   HTN (hypertension) 07/13/2012   Hypercholesterolemia    Hypertension    SOB (shortness of breath) 07/13/2012   Tachycardia, unspecified, "fluttering" 07/13/2012    Current Outpatient Medications  Medication Sig Dispense Refill   apixaban (ELIQUIS) 5 MG TABS tablet Take 1 tablet (5 mg total) by mouth 2 (two) times daily. 60 tablet 3   digoxin (LANOXIN) 0.125 MG tablet Take 1  tablet (0.125 mg total) by mouth daily. 30 tablet 1   furosemide (LASIX) 20 MG tablet Take 1 tablet (20 mg total) by mouth daily as needed. 90 tablet 3   Lancets (ONETOUCH DELICA PLUS LANCET33G) MISC      metoprolol tartrate (LOPRESSOR) 100 MG tablet Take 1 tablet (100 mg total) by mouth 2 (two) times daily. 180 tablet 0   ONETOUCH VERIO test strip      rosuvastatin (CRESTOR) 40 MG tablet Take 1 tablet (40 mg total) by mouth daily. 90 tablet 0   sacubitril-valsartan (ENTRESTO) 24-26 MG Take 1 tablet by mouth 2 (two)  times daily. 60 tablet 3   spironolactone (ALDACTONE) 25 MG tablet Take 0.5 tablets (12.5 mg total) by mouth daily. 45 tablet 0   ergocalciferol (VITAMIN D2) 50000 units capsule Take 50,000 Units by mouth once a week. (Patient not taking: Reported on 08/11/2022)     No current facility-administered medications for this encounter.    Allergies  Allergen Reactions   Janumet [Sitagliptin-Metformin Hcl] Shortness Of Breath   Januvia [Sitagliptin] Shortness Of Breath   Kombiglyze [Saxagliptin-Metformin Er] Shortness Of Breath   Elemental Sulfur Itching      Social History   Socioeconomic History   Marital status: Married    Spouse name: Not on file   Number of children: 2   Years of education: Not on file   Highest education level: Not on file  Occupational History   Occupation: retired  Tobacco Use   Smoking status: Former    Years: 2.00    Types: Cigarettes    Quit date: 05/13/1983    Years since quitting: 39.2   Smokeless tobacco: Never  Vaping Use   Vaping Use: Never used  Substance and Sexual Activity   Alcohol use: No   Drug use: No   Sexual activity: Yes  Other Topics Concern   Not on file  Social History Narrative   Not on file   Social Determinants of Health   Financial Resource Strain: Low Risk  (07/29/2022)   Overall Financial Resource Strain (CARDIA)    Difficulty of Paying Living Expenses: Not hard at all  Food Insecurity: No Food Insecurity (07/27/2022)   Hunger Vital Sign    Worried About Running Out of Food in the Last Year: Never true    Ran Out of Food in the Last Year: Never true  Transportation Needs: No Transportation Needs (07/27/2022)   PRAPARE - Administrator, Civil Service (Medical): No    Lack of Transportation (Non-Medical): No  Physical Activity: Inactive (04/23/2022)   Exercise Vital Sign    Days of Exercise per Week: 0 days    Minutes of Exercise per Session: 0 min  Stress: No Stress Concern Present (04/23/2022)   [employer] of Occupational Health - Occupational Stress Questionnaire    Feeling of Stress : Not at all  Social Connections: Not on file  Intimate Partner Violence: Not At Risk (07/27/2022)   Humiliation, Afraid, Rape, and Kick questionnaire    Fear of Current or Ex-Partner: No    Emotionally Abused: No    Physically Abused: No    Sexually Abused: No      Family History  Problem Relation Age of Onset   Heart attack Mother    Diabetes type II Mother    Stroke Mother    Alzheimer's disease Father    Diabetes type II Brother     Vitals:   08/11/22 1420  BP: 110/80  Pulse: 74  SpO2: 97%  Weight: 83.5 kg (184 lb)    PHYSICAL EXAM: General:  Well appearing.  HEENT: normal Neck: supple. no JVD. Carotids 2+ bilat; no bruits.  Cor: PMI nondisplaced. Irregular rhythm. No rubs, gallops or murmurs. Lungs: clear Abdomen: soft, nontender, nondistended.  Extremities: no cyanosis, clubbing, rash, edema Neuro: alert & oriented x 3, cranial nerves grossly intact. moves all 4 extremities w/o difficulty. Affect pleasant.  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 Plan to repeat echo once sinus rhythm restored. If no recovery in LV function, will need ischemic workup NYHA II/early III GDMT  Diuretic-Taking lasix 20 mg daily (Prescribed PRN), Scr recently 1.06>1.5 last week. Recommended she take lasix only as needed. Repeat labs next week. BB-Continue metoprolol tartrate 100 mg BID for rate control (switch to metoprolol succinate or coreg post cardioversion) Ace/ARB/ARNI-Entresto 24/26 mg BID MRA-Spiro 12.5 mg daily SGLT2i-No, recent UTI, consider next visit. No hx of recurrent UTI -Continue digoxin 0.125 mg daily, dig level 0.9 on 01/03 and 1.2 on 01/04. Had taken  digoxin both days. Has labs next week. Instructed to hold digoxin dose that am. -Did not titrate meds further today d/t AKI and volume depletion -CMET, digoxin level, CBC and BNP next week -She is wondering if some of her cardiac meds may be causing "weird taste". Will review with HF TOC PharmD.  Newly diagnosed atrial fibrillation -Rate now controlled with metoprolol tartrate -On Eliquis 5 BID. Reports compliance -Arrange TEE/DCCV in next couple of weeks -Hopefully have a reasonable chance of maintaining SR. Left atrium does not appear significantly enlarged as reported on echo (reviewed with Dr. Daniel Nones). -Consider sleep study in the future  HTN BP stable. 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 Seen by Neurology.  -Referred to Neuro follow-up -No residual deficits  Sent med refills for all HF meds to [facility]  Referred to HFSW (PCP, Medications, Transportation, ETOH Abuse, Drug Abuse, Insurance, Financial ): No Refer to Pharmacy: Yes, reviewed meds for side effects Refer to Home Health: No Refer to Advanced Heart Failure Clinic: Yes Refer to General Cardiology: No  Follow up  TEE/DCCV with Dr. Daniel Nones in 2-3 weeks, f/u Dr. Daniel Nones scheduled 09/22/22. If LV function improves over the next few months, can likely graduate from AHF clinic and follow with General Cardiology.

## 2022-08-11 NOTE — Patient Instructions (Addendum)
Only take Lasix as needed (for weight gain, swelling, or increased shortness of breath) We will send a referral to [facility] for your recent stroke. Return next week for lab work - DO NOT TAKE DIGOXIN MORNING OF THIS LAB WORK Fluid restriction between 31 - 54 oz/day. Outpatient TEE/Cardioversion scheduled for 09/02/22 at 09:30 - see instructions below. Return to see Dr. Daniel Nones as scheduled below.       Dear Renee Bradley   You are scheduled for a TEE (Transesophageal Echocardiogram) Guided Cardioversion on Wednesday, January 31 with Dr. Daniel Nones.  Please arrive at the [facility] (Main Entrance A) at [facility]: [place], [place] [address] at 9:30 AM.   DIET:  Nothing to eat or drink after midnight except a sip of water with medications (see medication instructions below)  MEDICATION INSTRUCTIONS: Take all medications with small sip of water morning of procedure.   Continue taking your anticoagulant (blood thinner): Apixaban (Eliquis).  You will need to continue this after your procedure until you are told by your provider that it is safe to stop.    FYI:  For your safety, and to allow [place] to monitor your vital signs accurately during the surgery/procedure we request: If you have artificial nails, gel coating, SNS etc, please have those removed prior to your surgery/procedure. Not having the nail coverings /polish removed may result in cancellation or delay of your surgery/procedure.  You must have a responsible person to drive you home and stay in the waiting area during your procedure. Failure to do so could result in cancellation.  Bring your insurance cards.  *Special Note: Every effort is made to have your procedure done on time. Occasionally there are emergencies that occur at the hospital that may cause delays. Please be patient if a delay does occur.

## 2022-08-12 ENCOUNTER — Telehealth (HOSPITAL_COMMUNITY): Payer: Self-pay | Admitting: Physician Assistant

## 2022-08-12 ENCOUNTER — Ambulatory Visit: Payer: Medicare Other | Attending: Cardiovascular Disease | Admitting: Occupational Therapy

## 2022-08-12 ENCOUNTER — Other Ambulatory Visit: Payer: Self-pay

## 2022-08-12 ENCOUNTER — Encounter (HOSPITAL_COMMUNITY): Payer: Self-pay | Admitting: *Deleted

## 2022-08-12 DIAGNOSIS — I69354 Hemiplegia and hemiparesis following cerebral infarction affecting left non-dominant side: Secondary | ICD-10-CM | POA: Insufficient documentation

## 2022-08-12 DIAGNOSIS — M6281 Muscle weakness (generalized): Secondary | ICD-10-CM | POA: Diagnosis not present

## 2022-08-12 DIAGNOSIS — R278 Other lack of coordination: Secondary | ICD-10-CM

## 2022-08-12 NOTE — Telephone Encounter (Signed)
I reviewed patient's meds with PharmD at her request. Sometimes statins have been reported to cause a "funny taste" but there does not appear to be a clear culprit on her med list for the change in taste she has been noticing.    Please update patient. 

## 2022-08-12 NOTE — Telephone Encounter (Signed)
Mychart msg sent to pt.
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
## 2022-08-12 NOTE — Addendum Note (Signed)
Encounter addended by: Joette Catching, PA-C on: 08/12/2022 12:12 PM  Actions taken: Clinical Note Signed

## 2022-08-12 NOTE — Telephone Encounter (Signed)
I reviewed patient's meds with PharmD at her request. Sometimes statins have been reported to cause a "funny taste" but there does not appear to be a clear culprit on her med list for the change in taste she has been noticing.    Please update patient.

## 2022-08-13 NOTE — Progress Notes (Signed)
Rescheduled 09/04/22  [place]  Direct Dial: [phone]

## 2022-08-14 ENCOUNTER — Encounter (HOSPITAL_COMMUNITY): Payer: Medicare Other

## 2022-08-18 ENCOUNTER — Ambulatory Visit (HOSPITAL_COMMUNITY)
Admission: RE | Admit: 2022-08-18 | Discharge: 2022-08-18 | Disposition: A | Discharge status: Home / Self Care | Payer: Medicare Other | Source: Ambulatory Visit | Attending: Cardiology | Admitting: Cardiology

## 2022-08-18 DIAGNOSIS — I5043 Acute on chronic combined systolic (congestive) and diastolic (congestive) heart failure: Secondary | ICD-10-CM | POA: Diagnosis not present

## 2022-08-18 DIAGNOSIS — Z79899 Other long term (current) drug therapy: Secondary | ICD-10-CM | POA: Diagnosis not present

## 2022-08-18 DIAGNOSIS — I4811 Longstanding persistent atrial fibrillation: Secondary | ICD-10-CM | POA: Diagnosis not present

## 2022-08-18 LAB — COMPREHENSIVE METABOLIC PANEL
ALT: 12 U/L (ref 0–44)
AST: 16 U/L (ref 15–41)
Albumin: 4 g/dL (ref 3.5–5.0)
Alkaline Phosphatase: 57 U/L (ref 38–126)
Anion gap: 8 (ref 5–15)
BUN: 16 mg/dL (ref 8–23)
CO2: 25 mmol/L (ref 22–32)
Calcium: 9.3 mg/dL (ref 8.9–10.3)
Chloride: 110 mmol/L (ref 98–111)
Creatinine, Ser: 1.33 mg/dL — ABNORMAL HIGH (ref 0.44–1.00)
GFR, Estimated: 42 mL/min — ABNORMAL LOW (ref >60)
Glucose, Bld: 136 mg/dL — ABNORMAL HIGH (ref 70–99)
Potassium: 3.7 mmol/L (ref 3.5–5.1)
Sodium: 143 mmol/L (ref 135–145)
Total Bilirubin: 1.9 mg/dL — ABNORMAL HIGH (ref 0.3–1.2)
Total Protein: 6.5 g/dL (ref 6.5–8.1)

## 2022-08-18 LAB — CBC
HCT: 44.2 % (ref 36.0–46.0)
Hemoglobin: 14.7 g/dL (ref 12.0–15.0)
MCH: 29.2 pg (ref 26.0–34.0)
MCHC: 33.3 g/dL (ref 30.0–36.0)
MCV: 87.7 fL (ref 80.0–100.0)
Platelets: 174 10*3/uL (ref 150–400)
RBC: 5.04 MIL/uL (ref 3.87–5.11)
RDW: 15 % (ref 11.5–15.5)
WBC: 3.9 10*3/uL — ABNORMAL LOW (ref 4.0–10.5)
nRBC: 0 % (ref 0.0–0.2)

## 2022-08-18 LAB — BRAIN NATRIURETIC PEPTIDE: B Natriuretic Peptide: 1330.4 pg/mL — ABNORMAL HIGH (ref 0.0–100.0)

## 2022-08-20 ENCOUNTER — Ambulatory Visit: Payer: Medicare Other | Admitting: Occupational Therapy

## 2022-08-20 DIAGNOSIS — M6281 Muscle weakness (generalized): Secondary | ICD-10-CM | POA: Diagnosis not present

## 2022-08-20 DIAGNOSIS — I69354 Hemiplegia and hemiparesis following cerebral infarction affecting left non-dominant side: Secondary | ICD-10-CM | POA: Diagnosis not present

## 2022-08-20 DIAGNOSIS — R278 Other lack of coordination: Secondary | ICD-10-CM
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
## 2022-08-24 ENCOUNTER — Ambulatory Visit (INDEPENDENT_AMBULATORY_CARE_PROVIDER_SITE_OTHER): Payer: Medicare Other | Admitting: Internal Medicine

## 2022-08-24 ENCOUNTER — Encounter: Payer: Self-pay | Admitting: Internal Medicine

## 2022-08-24 VITALS — BP 112/70 | HR 85 | Temp 97.6°F | Ht 62.0 in | Wt 180.0 lb

## 2022-08-24 DIAGNOSIS — I5020 Unspecified systolic (congestive) heart failure: Secondary | ICD-10-CM

## 2022-08-24 DIAGNOSIS — E1122 Type 2 diabetes mellitus with diabetic chronic kidney disease: Secondary | ICD-10-CM

## 2022-08-24 DIAGNOSIS — I4891 Unspecified atrial fibrillation: Secondary | ICD-10-CM

## 2022-08-24 DIAGNOSIS — N1831 Chronic kidney disease, stage 3a: Secondary | ICD-10-CM | POA: Diagnosis not present

## 2022-08-24 DIAGNOSIS — I130 Hypertensive heart and chronic kidney disease with heart failure and stage 1 through stage 4 chronic kidney disease, or unspecified chronic kidney disease: Secondary | ICD-10-CM

## 2022-08-24 DIAGNOSIS — I639 Cerebral infarction, unspecified: Secondary | ICD-10-CM

## 2022-08-24 DIAGNOSIS — E66811 Obesity, class 1: Secondary | ICD-10-CM

## 2022-08-24 DIAGNOSIS — Z6832 Body mass index (BMI) 32.0-32.9, adult: Secondary | ICD-10-CM | POA: Diagnosis not present

## 2022-08-24 DIAGNOSIS — E6609 Other obesity due to excess calories: Secondary | ICD-10-CM

## 2022-08-24 NOTE — Patient Instructions (Signed)

## 2022-08-24 NOTE — Progress Notes (Signed)
[name],acting as a Education administrator for [name], MD.,have documented all relevant documentation on the behalf of [name], MD,as directed by  [name], MD while in the presence of [name], MD.    Subjective:     Patient ID: Renee Bradley , female    DOB: 17-Dec-1945 , 77 y.o.   MRN: [id number]   Chief Complaint  Patient presents with   Diabetes   Hypertension    HPI  She presents today for diabetes and htn check. She reports compliance with meds. She denies headaches, chest pain and shortness of breath.     Diabetes She presents for her follow-up diabetic visit. She has type 2 diabetes mellitus. Her disease course has been stable. There are no hypoglycemic associated symptoms. There are no diabetic associated symptoms. Pertinent negatives for diabetes include no blurred vision, no chest pain, no polydipsia, no polyphagia and no polyuria. There are no hypoglycemic complications. Diabetic complications include nephropathy. Risk factors for coronary artery disease include diabetes mellitus, dyslipidemia, hypertension, post-menopausal, sedentary lifestyle and obesity. She is compliant with treatment some of the time. Her weight is fluctuating minimally. She is following a diabetic diet. She participates in exercise intermittently. An ACE inhibitor/angiotensin II receptor blocker is not being taken.  Hypertension This is a chronic problem. The current episode started more than 1 year ago. The problem has been gradually improving since onset. The problem is uncontrolled. Pertinent negatives include no blurred vision or chest pain. The current treatment provides moderate improvement. Hypertensive end-organ damage includes kidney disease.     Past Medical History:  Diagnosis Date   Asthma    Chest pressure 07/13/2012   Diabetes mellitus without complication (HCC)    DM (diabetes mellitus) ([place]) 07/13/2012   HTN (hypertension) 07/13/2012    Hypercholesterolemia    Hypertension    SOB (shortness of breath) 07/13/2012   Tachycardia, unspecified, "fluttering" 07/13/2012     Family History  Problem Relation Age of Onset   Heart attack Mother    Diabetes type II Mother    Stroke Mother    Alzheimer's disease Father    Diabetes type II Brother      Current Outpatient Medications:    apixaban (ELIQUIS) 5 MG TABS tablet, Take 1 tablet (5 mg total) by mouth 2 (two) times daily., Disp: 60 tablet, Rfl: 3   digoxin (LANOXIN) 0.125 MG tablet, Take 1 tablet (0.125 mg total) by mouth daily., Disp: 30 tablet, Rfl: 2   furosemide (LASIX) 20 MG tablet, Take 1 tablet (20 mg total) by mouth daily as needed. (Patient taking differently: Take 20 mg by mouth daily as needed for fluid or edema.), Disp: 90 tablet, Rfl: 3   Lancets (ONETOUCH DELICA PLUS [id number]) MISC, , Disp: , Rfl:    metoprolol tartrate (LOPRESSOR) 100 MG tablet, Take 1 tablet (100 mg total) by mouth 2 (two) times daily., Disp: 180 tablet, Rfl: 2   ONETOUCH VERIO test strip, , Disp: , Rfl:    rosuvastatin (CRESTOR) 40 MG tablet, Take 1 tablet (40 mg total) by mouth daily., Disp: 90 tablet, Rfl: 2   sacubitril-valsartan (ENTRESTO) 24-26 MG, Take 1 tablet by mouth 2 (two) times daily., Disp: 180 tablet, Rfl: 3   spironolactone (ALDACTONE) 25 MG tablet, Take 0.5 tablets (12.5 mg total) by mouth daily., Disp: 45 tablet, Rfl: 3   Allergies  Allergen Reactions   Janumet [Sitagliptin-Metformin Hcl] Shortness Of Breath   Januvia [Sitagliptin] Shortness Of Breath  Kombiglyze [Saxagliptin-Metformin Er] Shortness Of Breath   Sulfa Antibiotics Shortness Of Breath   Elemental Sulfur Itching     Review of Systems  Constitutional: Negative.   Eyes: Negative.  Negative for blurred vision.  Respiratory: Negative.    Cardiovascular: Negative.  Negative for chest pain.  Gastrointestinal: Negative.   Endocrine: Negative for polydipsia, polyphagia and polyuria.  Musculoskeletal:  Negative.   Skin: Negative.   Neurological: Negative.   Psychiatric/Behavioral: Negative.       Today's Vitals   08/24/22 1157  BP: 112/70  Pulse: 85  Temp: 97.6 F (36.4 C)  Weight: 180 lb (81.6 kg)  Height: 5' 2"$  (1.575 m)  PainSc: 0-No pain   Body mass index is 32.92 kg/m.  Wt Readings from Last 3 Encounters:  09/02/22 174 lb (78.9 kg)  08/24/22 180 lb (81.6 kg)  08/11/22 184 lb (83.5 kg)     Objective:  Physical Exam Vitals and nursing note reviewed.  Constitutional:      Appearance: Normal appearance.  HENT:     Head: Normocephalic and atraumatic.     Nose:     Comments: MASKED     Mouth/Throat:     Comments: MASKED  Eyes:     Extraocular Movements: Extraocular movements intact.  Cardiovascular:     Rate and Rhythm: Normal rate. Rhythm irregular.     Heart sounds: Normal heart sounds.  Pulmonary:     Effort: Pulmonary effort is normal.     Breath sounds: Normal breath sounds.  Skin:    General: Skin is warm.  Neurological:     General: No focal deficit present.     Mental Status: She is alert. She is disoriented.  Psychiatric:        Mood and Affect: Mood normal.        Behavior: Behavior normal.         Assessment And Plan:     1. Type 2 diabetes mellitus with stage 3a chronic kidney disease, without long-term current use of insulin (HCC) Comments: Chronic, reminded to avoid NSAIDs, keep BP/BS controlled and stay hydrated to decrease risk of CKD progression. She prefers to control w/ diet. She does not wish to start meds unless A1c reaches 7.0 or above. Last A1c 6.5 in December 2023. Unable to consider SGLT2 inh due to recent UTI. May consider low dose in the future.   2. Hypertensive heart and renal disease with renal failure, stage 1 through stage 4 or unspecified chronic kidney disease, with heart failure ([place]) Comments: Chronic, well controlled. She is on meds as listed below.  3. HFrEF (heart failure with reduced ejection fraction)  (HCC) Comments: Newly diagnosed.  She will c/w Entresto, spironlactone and metoprolol. Encouraged to follow low sodium diet.  4. Atrial fibrillation with RVR ([place]) Comments: HR has improved with use of metoprolol. Encouraged to avoid caffeinated drinks.  5. Class 1 obesity due to excess calories with serious comorbidity and body mass index (BMI) of 32.0 to 32.9 in adult Comments: She is encouraged to gradually increase her daily activity. I plan to refer her to PREP in the near future, she is not yet ready at this time.     Patient was given opportunity to ask questions. Patient verbalized understanding of the plan and was able to repeat key elements of the plan. All questions were answered to their satisfaction.   I, [name], MD, have reviewed all documentation for this visit. The documentation on 08/24/22 for the exam, diagnosis, procedures,  and orders are all accurate and complete.   IF YOU HAVE BEEN REFERRED TO A SPECIALIST, IT MAY TAKE 1-2 WEEKS TO SCHEDULE/PROCESS THE REFERRAL. IF YOU HAVE NOT HEARD FROM US/SPECIALIST IN TWO WEEKS, PLEASE GIVE [place] A CALL AT [phone].   THE PATIENT IS ENCOURAGED TO PRACTICE SOCIAL DISTANCING DUE TO THE COVID-19 PANDEMIC.

## 2022-08-27 ENCOUNTER — Ambulatory Visit: Payer: Medicare Other | Admitting: Occupational Therapy

## 2022-08-27 DIAGNOSIS — M6281 Muscle weakness (generalized): Secondary | ICD-10-CM

## 2022-08-27 DIAGNOSIS — R278 Other lack of coordination: Secondary | ICD-10-CM | POA: Diagnosis not present

## 2022-08-27 DIAGNOSIS — I69354 Hemiplegia and hemiparesis following cerebral infarction affecting left non-dominant side: Secondary | ICD-10-CM
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
## 2022-09-01 ENCOUNTER — Ambulatory Visit: Payer: Medicare Other | Admitting: Occupational Therapy

## 2022-09-01 DIAGNOSIS — R278 Other lack of coordination: Secondary | ICD-10-CM

## 2022-09-01 DIAGNOSIS — M6281 Muscle weakness (generalized): Secondary | ICD-10-CM | POA: Diagnosis not present

## 2022-09-01 DIAGNOSIS — I69354 Hemiplegia and hemiparesis following cerebral infarction affecting left non-dominant side: Secondary | ICD-10-CM | POA: Diagnosis not present

## 2022-09-01 NOTE — Anesthesia Preprocedure Evaluation (Signed)
Anesthesia Evaluation  Patient identified by MRN, date of birth, ID band Patient awake    Reviewed: Allergy & Precautions, NPO status , Patient's Chart, lab work & pertinent test results  Airway Mallampati: II  TM Distance: >3 FB Neck ROM: Full    Dental no notable dental hx. (+) Teeth Intact, Dental Advisory Given   Pulmonary former smoker   Pulmonary exam normal breath sounds clear to auscultation       Cardiovascular hypertension, Normal cardiovascular exam+ dysrhythmias  Rhythm:Regular Rate:Normal     Neuro/Psych    GI/Hepatic   Endo/Other  diabetes    Renal/GU Lab Results      Component                Value               Date                      CREATININE               1.33 (H)            08/18/2022               K                        3.7                 08/18/2022                    Musculoskeletal   Abdominal   Peds  Hematology   Anesthesia Other Findings All: sulfa, januvia,metformin  Reproductive/Obstetrics                             Anesthesia Physical Anesthesia Plan  ASA: 3  Anesthesia Plan: MAC   Post-op Pain Management: Minimal or no pain anticipated   Induction: Intravenous  PONV Risk Score and Plan: Propofol infusion and Treatment may vary due to age or medical condition  Airway Management Planned: Natural Airway and Nasal Cannula  Additional Equipment: None  Intra-op Plan:   Post-operative Plan:   Informed Consent: I have reviewed the patients History and Physical, chart, labs and discussed the procedure including the risks, benefits and alternatives for the proposed anesthesia with the patient or authorized representative who has indicated his/her understanding and acceptance.     Dental advisory given  Plan Discussed with: CRNA, Anesthesiologist and Surgeon  Anesthesia Plan Comments:         Anesthesia Quick Evaluation
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
## 2022-09-02 ENCOUNTER — Encounter (HOSPITAL_COMMUNITY)
Admission: RE | Disposition: A | Discharge status: Home / Self Care | Payer: Self-pay | Source: Home / Self Care | Attending: Cardiology

## 2022-09-02 ENCOUNTER — Ambulatory Visit (HOSPITAL_COMMUNITY)
Admission: RE | Admit: 2022-09-02 | Discharge: 2022-09-02 | Disposition: A | Discharge status: Home / Self Care | Payer: Medicare Other | Attending: Cardiology | Admitting: Cardiology

## 2022-09-02 ENCOUNTER — Telehealth (HOSPITAL_COMMUNITY): Payer: Self-pay

## 2022-09-02 ENCOUNTER — Ambulatory Visit (HOSPITAL_BASED_OUTPATIENT_CLINIC_OR_DEPARTMENT_OTHER): Payer: Medicare Other | Admitting: Anesthesiology

## 2022-09-02 ENCOUNTER — Ambulatory Visit (HOSPITAL_COMMUNITY): Payer: Medicare Other | Admitting: Anesthesiology

## 2022-09-02 ENCOUNTER — Other Ambulatory Visit: Payer: Self-pay

## 2022-09-02 ENCOUNTER — Ambulatory Visit (HOSPITAL_BASED_OUTPATIENT_CLINIC_OR_DEPARTMENT_OTHER)
Admission: RE | Admit: 2022-09-02 | Discharge: 2022-09-02 | Disposition: A | Discharge status: Home / Self Care | Payer: Medicare Other | Source: Ambulatory Visit | Attending: Cardiology | Admitting: Cardiology

## 2022-09-02 ENCOUNTER — Encounter (HOSPITAL_COMMUNITY): Payer: Self-pay | Admitting: Cardiology

## 2022-09-02 DIAGNOSIS — I5021 Acute systolic (congestive) heart failure: Secondary | ICD-10-CM

## 2022-09-02 DIAGNOSIS — I5043 Acute on chronic combined systolic (congestive) and diastolic (congestive) heart failure: Secondary | ICD-10-CM

## 2022-09-02 DIAGNOSIS — Z79899 Other long term (current) drug therapy: Secondary | ICD-10-CM | POA: Diagnosis not present

## 2022-09-02 DIAGNOSIS — E785 Hyperlipidemia, unspecified: Secondary | ICD-10-CM | POA: Insufficient documentation

## 2022-09-02 DIAGNOSIS — I428 Other cardiomyopathies: Secondary | ICD-10-CM | POA: Diagnosis not present

## 2022-09-02 DIAGNOSIS — I10 Essential (primary) hypertension: Secondary | ICD-10-CM | POA: Diagnosis not present

## 2022-09-02 DIAGNOSIS — I4891 Unspecified atrial fibrillation: Secondary | ICD-10-CM | POA: Insufficient documentation

## 2022-09-02 DIAGNOSIS — I110 Hypertensive heart disease with heart failure: Secondary | ICD-10-CM | POA: Diagnosis not present

## 2022-09-02 DIAGNOSIS — Z87891 Personal history of nicotine dependence: Secondary | ICD-10-CM

## 2022-09-02 DIAGNOSIS — Z8673 Personal history of transient ischemic attack (TIA), and cerebral infarction without residual deficits: Secondary | ICD-10-CM | POA: Diagnosis not present

## 2022-09-02 DIAGNOSIS — I4811 Longstanding persistent atrial fibrillation: Secondary | ICD-10-CM

## 2022-09-02 DIAGNOSIS — Z7901 Long term (current) use of anticoagulants: Secondary | ICD-10-CM | POA: Diagnosis not present

## 2022-09-02 DIAGNOSIS — E119 Type 2 diabetes mellitus without complications: Secondary | ICD-10-CM

## 2022-09-02 DIAGNOSIS — I5022 Chronic systolic (congestive) heart failure: Secondary | ICD-10-CM | POA: Insufficient documentation

## 2022-09-02 HISTORY — PX: TEE WITHOUT CARDIOVERSION: SHX5443

## 2022-09-02 HISTORY — PX: CARDIOVERSION: SHX1299

## 2022-09-02 LAB — POCT I-STAT, CHEM 8
BUN: 22 mg/dL (ref 8–23)
Calcium, Ion: 1.07 mmol/L — ABNORMAL LOW (ref 1.15–1.40)
Chloride: 109 mmol/L (ref 98–111)
Creatinine, Ser: 1.4 mg/dL — ABNORMAL HIGH (ref 0.44–1.00)
Glucose, Bld: 148 mg/dL — ABNORMAL HIGH (ref 70–99)
HCT: 44 % (ref 36.0–46.0)
Hemoglobin: 15 g/dL (ref 12.0–15.0)
Potassium: 4.2 mmol/L (ref 3.5–5.1)
Sodium: 147 mmol/L — ABNORMAL HIGH (ref 135–145)
TCO2: 27 mmol/L (ref 22–32)

## 2022-09-02 SURGERY — ECHOCARDIOGRAM, TRANSESOPHAGEAL
Anesthesia: Monitor Anesthesia Care

## 2022-09-02 MED ORDER — PROPOFOL 10 MG/ML IV BOLUS
INTRAVENOUS | Status: DC | PRN
  Administered 2022-09-02: 30 mg via INTRAVENOUS
  Administered 2022-09-02: 40 mg via INTRAVENOUS

## 2022-09-02 MED ORDER — PROPOFOL 500 MG/50ML IV EMUL
INTRAVENOUS | Status: DC | PRN
  Administered 2022-09-02: 50 ug/kg/min via INTRAVENOUS

## 2022-09-02 MED ORDER — SODIUM CHLORIDE 0.9 % IV SOLN: INTRAVENOUS | Status: DC

## 2022-09-02 MED ORDER — EPHEDRINE SULFATE-NACL 50-0.9 MG/10ML-% IV SOSY
PREFILLED_SYRINGE | INTRAVENOUS | Status: DC | PRN
  Administered 2022-09-02: 10 mg via INTRAVENOUS

## 2022-09-02 NOTE — Transfer of Care (Signed)
Immediate Anesthesia Transfer of Care Note  Patient: Renee Bradley  Procedure(s) Performed: TRANSESOPHAGEAL ECHOCARDIOGRAM (TEE) CARDIOVERSION  Patient Location: Endoscopy Unit  Anesthesia Type:MAC  Level of Consciousness: drowsy and patient cooperative  Airway & Oxygen Therapy: Patient Spontanous Breathing and Patient connected to nasal cannula oxygen  Post-op Assessment: Report given to RN, Post -op Vital signs reviewed and stable, and Patient moving all extremities X 4  Post vital signs: Reviewed and stable  Last Vitals:  Vitals Value Taken Time  BP 122/53   Temp    Pulse 67   Resp 14   SpO2 94     Last Pain:  Vitals:   09/02/22 0946  TempSrc: Tympanic  PainSc: 0-No pain         Complications: No notable events documented.

## 2022-09-02 NOTE — Anesthesia Procedure Notes (Signed)
Procedure Name: MAC Date/Time: 09/02/2022 10:56 AM  Performed by: Darletta Moll, CRNAPre-anesthesia Checklist: Patient identified, Emergency Drugs available, Suction available and Patient being monitored Patient Re-evaluated:Patient Re-evaluated prior to induction Oxygen Delivery Method: Nasal cannula

## 2022-09-02 NOTE — Progress Notes (Signed)
Patient recovered and ready to leave, awaiting dr to see patient

## 2022-09-02 NOTE — Telephone Encounter (Signed)
Called patient back after she left message about procedure this am.States she's feeling dehydrated. Instructed patient to take her blood  sugar. Aware of nothing to eat or drink.Has transportation and aware of time.

## 2022-09-02 NOTE — Discharge Instructions (Signed)
TEE  YOU HAD AN CARDIAC PROCEDURE TODAY: Refer to the procedure report and other information in the discharge instructions given to you for any specific questions about what was found during the examination. If this information does not answer your questions, please call [facility] office at [phone] to clarify.   DIET: Your first meal following the procedure should be a light meal and then it is ok to progress to your normal diet. A half-sandwich or bowl of soup is an example of a good first meal. Heavy or fried foods are harder to digest and may make you feel nauseous or bloated. Drink plenty of fluids but you should avoid alcoholic beverages for 24 hours. If you had a esophageal dilation, please see attached instructions for diet.   ACTIVITY: Your care partner should take you home directly after the procedure. You should plan to take it easy, moving slowly for the rest of the day. You can resume normal activity the day after the procedure however YOU SHOULD NOT DRIVE, use power tools, machinery or perform tasks that involve climbing or major physical exertion for 24 hours (because of the sedation medicines used during the test).   SYMPTOMS TO REPORT IMMEDIATELY: A cardiologist can be reached at any hour. Please call [phone] for any of the following symptoms:  Vomiting of blood or coffee ground material  New, significant abdominal pain  New, significant chest pain or pain under the shoulder blades  Painful or persistently difficult swallowing  New shortness of breath  Black, tarry-looking or red, bloody stools  FOLLOW UP:  Please also call with any specific questions about appointments or follow up tests.  Electrical Cardioversion  Electrical cardioversion is the delivery of a jolt of electricity to restore a normal rhythm to the heart. A rhythm that is too fast or is not regular keeps the heart from pumping well. In this procedure, sticky patches or metal paddles are placed on  the chest to deliver electricity to the heart from a device.  If this information does not answer your questions, please call  [facility] office at [phone] to clarify.   Follow these instructions at home: You may have some redness on the skin where the shocks were given.  You may apply over-the-counter hydrocortisone cream or aloe vera to alleviate skin irritation. YOU SHOULD NOT DRIVE, use power tools, machinery or perform tasks that involve climbing or major physical exertion for 24 hours (because of the sedation medicines used during the test).  Take over-the-counter and prescription medicines only as told by your health care provider. Ask your health care provider how to check your pulse. Check it often. Rest for 48 hours after the procedure or as told by your health care provider. Avoid or limit your caffeine use as told by your health care provider. Keep all follow-up visits as told by your health care provider. This is important.  FOLLOW UP:  Please also call with any specific questions about appointments or follow up tests.   

## 2022-09-02 NOTE — Interval H&P Note (Signed)
History and Physical Interval Note:  09/02/2022 10:45 AM  Renee Bradley  has presented today for surgery, with the diagnosis of AFIB.  The various methods of treatment have been discussed with the patient and family. After consideration of risks, benefits and other options for treatment, the patient has consented to  Procedure(s): TRANSESOPHAGEAL ECHOCARDIOGRAM (TEE) (N/A) CARDIOVERSION (N/A) as a surgical intervention.  The patient's history has been reviewed, patient examined, no change in status, stable for surgery.  I have reviewed the patient's chart and labs.  Questions were answered to the patient's satisfaction.     Lamar Meter

## 2022-09-02 NOTE — Progress Notes (Signed)
  Echocardiogram Echocardiogram Transesophageal has been performed.  Renee Bradley 09/02/2022, 11:17 AM

## 2022-09-02 NOTE — Anesthesia Postprocedure Evaluation (Signed)
Anesthesia Post Note  Patient: Renee Bradley  Procedure(s) Performed: TRANSESOPHAGEAL ECHOCARDIOGRAM (TEE) CARDIOVERSION     Patient location during evaluation: Endoscopy Anesthesia Type: MAC Level of consciousness: awake and alert Pain management: pain level controlled Vital Signs Assessment: post-procedure vital signs reviewed and stable Respiratory status: spontaneous breathing, nonlabored ventilation, respiratory function stable and patient connected to nasal cannula oxygen Cardiovascular status: blood pressure returned to baseline and stable Postop Assessment: no apparent nausea or vomiting Anesthetic complications: no  No notable events documented.  Last Vitals:  Vitals:   09/02/22 1140 09/02/22 1150  BP: (!) 115/58 (!) 114/55  Pulse: (!) 49 (!) 47  Resp: 12 15  Temp:    SpO2: 93% 95%    Last Pain:  Vitals:   09/02/22 1150  TempSrc:   PainSc: 0-No pain                 Barnet Glasgow

## 2022-09-03 ENCOUNTER — Encounter (HOSPITAL_COMMUNITY): Payer: Self-pay | Admitting: Cardiology

## 2022-09-03 ENCOUNTER — Ambulatory Visit: Payer: Medicare Other | Admitting: Occupational Therapy

## 2022-09-04 ENCOUNTER — Ambulatory Visit: Payer: Self-pay

## 2022-09-04 NOTE — Patient Outreach (Signed)
  Care Coordination   Follow Up Visit Note   09/04/2022 Name: TVISHA SCHWOERER MRN: [id number] DOB: 01-27-1946  [name] is a 77 y.o. year old female who sees Glendale Chard, MD for primary care. I spoke with  Jennifr E Klinck by phone today.  What matters to the patients health and wellness today?  Patient will take her medications exactly as prescribed to help maintain her rate control.     Goals Addressed               This Visit's Progress     Patient Stated     To keep my heart rate in control (pt-stated)        Care Coordination Interventions: Evaluation of current treatment plan related to Atrial fib and patient's adherence to plan as established by provider Reviewed importance of adherence to anticoagulant exactly as prescribed Advised patient to discuss changes in heart rhythm, and or shortness of breath  with provider Counseled on bleeding risk associated with Eliquis and importance of self-monitoring for signs/symptoms of bleeding Counseled on avoidance of NSAIDs due to increased bleeding risk with anticoagulants Counseled on importance of regular laboratory monitoring as prescribed Counseled on seeking medical attention after a head injury or if there is blood in the urine/stool Afib action plan reviewed           SDOH assessments and interventions completed:  No     Care Coordination Interventions:  Yes, provided   Follow up plan: Follow up call scheduled for 09/23/22 @11 :00 AM    Encounter Outcome:  Pt. Visit Completed

## 2022-09-04 NOTE — Patient Instructions (Addendum)
Visit Information  Thank you for taking time to visit with me today. Please don't hesitate to contact me if I can be of assistance to you.   Following are the goals we discussed today:   Goals Addressed               This Visit's Progress     Patient Stated     To keep my heart rate in control (pt-stated)        Care Coordination Interventions: Evaluation of current treatment plan related to Atrial fib and patient's adherence to plan as established by provider Reviewed importance of adherence to anticoagulant exactly as prescribed Advised patient to discuss changes in heart rhythm, and or shortness of breath  with provider Counseled on bleeding risk associated with Eliquis and importance of self-monitoring for signs/symptoms of bleeding Counseled on avoidance of NSAIDs due to increased bleeding risk with anticoagulants Counseled on importance of regular laboratory monitoring as prescribed Counseled on seeking medical attention after a head injury or if there is blood in the urine/stool Afib action plan reviewed           Our next appointment is by telephone on 09/23/22 at 11:00 PM  Please call the care guide team at [phone] if you need to cancel or reschedule your appointment.   If you are experiencing a Mental Health or [employer] or need someone to talk to, please go to [facility] [address] [place] [phone])  Patient verbalizes understanding of instructions and care plan provided today and agrees to view in [place]. Active MyChart status and patient understanding of how to access instructions and care plan via MyChart confirmed with patient.     Barb Merino, RN, BSN, CCM Care Management Coordinator [facility] Care Management Direct Phone: [phone]

## 2022-09-08 ENCOUNTER — Ambulatory Visit: Payer: Medicare Other | Attending: Cardiovascular Disease | Admitting: Occupational Therapy

## 2022-09-08 ENCOUNTER — Inpatient Hospital Stay: Payer: Medicare Other | Admitting: Adult Health

## 2022-09-08 DIAGNOSIS — I69354 Hemiplegia and hemiparesis following cerebral infarction affecting left non-dominant side: Secondary | ICD-10-CM | POA: Insufficient documentation

## 2022-09-08 DIAGNOSIS — M6281 Muscle weakness (generalized): Secondary | ICD-10-CM | POA: Insufficient documentation

## 2022-09-08 DIAGNOSIS — R278 Other lack of coordination: Secondary | ICD-10-CM | POA: Diagnosis not present
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
## 2022-09-10 ENCOUNTER — Ambulatory Visit: Payer: Medicare Other | Admitting: Occupational Therapy

## 2022-09-10 DIAGNOSIS — M6281 Muscle weakness (generalized): Secondary | ICD-10-CM

## 2022-09-10 DIAGNOSIS — R278 Other lack of coordination: Secondary | ICD-10-CM

## 2022-09-10 DIAGNOSIS — I69354 Hemiplegia and hemiparesis following cerebral infarction affecting left non-dominant side: Secondary | ICD-10-CM | POA: Diagnosis not present
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
## 2022-09-15 ENCOUNTER — Ambulatory Visit: Payer: Medicare Other | Admitting: Occupational Therapy

## 2022-09-15 DIAGNOSIS — M6281 Muscle weakness (generalized): Secondary | ICD-10-CM

## 2022-09-15 DIAGNOSIS — I69354 Hemiplegia and hemiparesis following cerebral infarction affecting left non-dominant side: Secondary | ICD-10-CM

## 2022-09-15 DIAGNOSIS — R278 Other lack of coordination: Secondary | ICD-10-CM
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
## 2022-09-17 ENCOUNTER — Ambulatory Visit: Payer: Medicare Other | Admitting: Occupational Therapy

## 2022-09-17 DIAGNOSIS — M6281 Muscle weakness (generalized): Secondary | ICD-10-CM

## 2022-09-17 DIAGNOSIS — R278 Other lack of coordination: Secondary | ICD-10-CM | POA: Diagnosis not present

## 2022-09-17 DIAGNOSIS — I69354 Hemiplegia and hemiparesis following cerebral infarction affecting left non-dominant side: Secondary | ICD-10-CM
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
## 2022-09-21 ENCOUNTER — Ambulatory Visit: Payer: Medicare Other | Admitting: Occupational Therapy

## 2022-09-21 DIAGNOSIS — M6281 Muscle weakness (generalized): Secondary | ICD-10-CM

## 2022-09-21 DIAGNOSIS — I69354 Hemiplegia and hemiparesis following cerebral infarction affecting left non-dominant side: Secondary | ICD-10-CM

## 2022-09-21 DIAGNOSIS — R278 Other lack of coordination: Secondary | ICD-10-CM
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
## 2022-09-22 ENCOUNTER — Telehealth (HOSPITAL_COMMUNITY): Payer: Self-pay

## 2022-09-22 ENCOUNTER — Ambulatory Visit (HOSPITAL_COMMUNITY)
Admission: RE | Admit: 2022-09-22 | Discharge: 2022-09-22 | Disposition: A | Discharge status: Home / Self Care | Payer: Medicare Other | Source: Ambulatory Visit | Attending: Cardiology | Admitting: Cardiology

## 2022-09-22 ENCOUNTER — Encounter (HOSPITAL_COMMUNITY): Payer: Self-pay | Admitting: Cardiology

## 2022-09-22 VITALS — BP 90/50 | HR 44 | Wt 169.4 lb

## 2022-09-22 DIAGNOSIS — I4891 Unspecified atrial fibrillation: Secondary | ICD-10-CM | POA: Diagnosis not present

## 2022-09-22 DIAGNOSIS — E785 Hyperlipidemia, unspecified: Secondary | ICD-10-CM | POA: Insufficient documentation

## 2022-09-22 DIAGNOSIS — Z7901 Long term (current) use of anticoagulants: Secondary | ICD-10-CM | POA: Diagnosis not present

## 2022-09-22 DIAGNOSIS — I5020 Unspecified systolic (congestive) heart failure: Secondary | ICD-10-CM | POA: Insufficient documentation

## 2022-09-22 DIAGNOSIS — E669 Obesity, unspecified: Secondary | ICD-10-CM | POA: Insufficient documentation

## 2022-09-22 DIAGNOSIS — Z8673 Personal history of transient ischemic attack (TIA), and cerebral infarction without residual deficits: Secondary | ICD-10-CM | POA: Insufficient documentation

## 2022-09-22 DIAGNOSIS — E119 Type 2 diabetes mellitus without complications: Secondary | ICD-10-CM | POA: Diagnosis not present

## 2022-09-22 DIAGNOSIS — I639 Cerebral infarction, unspecified: Secondary | ICD-10-CM

## 2022-09-22 DIAGNOSIS — I4811 Longstanding persistent atrial fibrillation: Secondary | ICD-10-CM

## 2022-09-22 DIAGNOSIS — I5022 Chronic systolic (congestive) heart failure: Secondary | ICD-10-CM

## 2022-09-22 DIAGNOSIS — I110 Hypertensive heart disease with heart failure: Secondary | ICD-10-CM | POA: Insufficient documentation

## 2022-09-22 DIAGNOSIS — R001 Bradycardia, unspecified: Secondary | ICD-10-CM | POA: Diagnosis not present

## 2022-09-22 DIAGNOSIS — Z79899 Other long term (current) drug therapy: Secondary | ICD-10-CM | POA: Diagnosis not present

## 2022-09-22 LAB — BASIC METABOLIC PANEL
Anion gap: 13 (ref 5–15)
BUN: 18 mg/dL (ref 8–23)
CO2: 29 mmol/L (ref 22–32)
Calcium: 9.2 mg/dL (ref 8.9–10.3)
Chloride: 103 mmol/L (ref 98–111)
Creatinine, Ser: 1.41 mg/dL — ABNORMAL HIGH (ref 0.44–1.00)
GFR, Estimated: 39 mL/min — ABNORMAL LOW (ref >60)
Glucose, Bld: 134 mg/dL — ABNORMAL HIGH (ref 70–99)
Potassium: 2.6 mmol/L — CL (ref 3.5–5.1)
Sodium: 145 mmol/L (ref 135–145)

## 2022-09-22 LAB — DIGOXIN LEVEL: Digoxin Level: 1.5 ng/mL (ref 0.8–2.0)

## 2022-09-22 LAB — BRAIN NATRIURETIC PEPTIDE: B Natriuretic Peptide: 154 pg/mL — ABNORMAL HIGH (ref 0.0–100.0)

## 2022-09-22 MED ORDER — METOPROLOL SUCCINATE ER 100 MG PO TB24: 100.0000 mg | ORAL_TABLET | Freq: Every day | ORAL | 3 refills | Status: DC

## 2022-09-22 MED ORDER — METOPROLOL SUCCINATE ER 100 MG PO TB24: 100.0000 mg | ORAL_TABLET | Freq: Every day | ORAL | 0 refills | Status: DC

## 2022-09-22 MED ORDER — POTASSIUM CHLORIDE CRYS ER 20 MEQ PO TBCR: 20.0000 meq | EXTENDED_RELEASE_TABLET | Freq: Every day | ORAL | 3 refills | Status: DC

## 2022-09-22 NOTE — Telephone Encounter (Signed)
Spoke with Dr. Daniel Nones and he recommnends Renee Bradley takes 40 mEq Twice daily today 40 mEq Twice daily tomorrow, then 20 mEq daily, and repeat labs next week. Ex sent and labs ordered and scheduled.

## 2022-09-22 NOTE — Patient Instructions (Signed)
STOP Digoxin  STOP Metoprolol Tartrate   START Toprol XL 100 mg daily.  Labs done today, your results will be available in MyChart, we will contact you for abnormal readings.  Your physician has requested that you have an echocardiogram. Echocardiography is a painless test that uses sound waves to create images of your heart. It provides your doctor with information about the size and shape of your heart and how well your heart's chambers and valves are working. This procedure takes approximately one hour. There are no restrictions for this procedure. Please do NOT wear cologne, perfume, aftershave, or lotions (deodorant is allowed). Please arrive 15 minutes prior to your appointment time.  Your physician recommends that you schedule a follow-up appointment in: 1 month  If you have any questions or concerns before your next appointment please send [place] a message through [place] or call our office at [phone].    TO LEAVE A MESSAGE FOR THE NURSE SELECT OPTION 2, PLEASE LEAVE A MESSAGE INCLUDING: YOUR NAME DATE OF BIRTH CALL BACK NUMBER REASON FOR CALL**this is important as we prioritize the call backs  YOU WILL RECEIVE A CALL BACK THE SAME DAY AS LONG AS YOU CALL BEFORE 4:00 PM  At the [facility], you and your health needs are our priority. As part of our continuing mission to provide you with exceptional heart care, we have created designated Provider Care Teams. These Care Teams include your primary Cardiologist (physician) and Advanced Practice Providers (APPs- Physician Assistants and Nurse Practitioners) who all work together to provide you with the care you need, when you need it.   You may see any of the following providers on your designated Care Team at your next follow up: Dr Glori Bickers Dr Loralie Champagne Dr. Roxana Hires, NP Lyda Jester, [place] [facility] [place], [place] Forestine Na, NP Audry Riles, PharmD   Please be  sure to bring in all your medications bottles to every appointment.    Thank you for choosing [facility]

## 2022-09-22 NOTE — Telephone Encounter (Signed)
Lab called with critical potassium of 2.6

## 2022-09-22 NOTE — Addendum Note (Signed)
Addended by: Jerl Mina on: 09/22/2022 03:51 PM   Modules accepted: Orders

## 2022-09-22 NOTE — Progress Notes (Signed)
ADVANCED HEART FAILURE CLINIC NOTE  Referring Physician: Glendale Chard, MD  Primary Care: Glendale Chard, MD Primary Cardiologist:  HPI: Renee Bradley is a 77 y.o. 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  Her December 2023 when she presented with acute stroke, atrial fibrillation and newly diagnosed heart failure with reduced ejection fraction.  During her admission she was started on low-dose GDMT and since that time has undergone TEE cardioversion.  She was seen in [facility] clinic where GDMT was further uptitrated.  Interval hx:  Since her TEE/DCCV, she has had a mild improvement in functional status, however, reports decreased appetite, loss of taste and continued fatigue.  Activity level/exercise tolerance:  NYHA III; secondary to some deconditioning. She has been fairly bradycardic.  Orthopnea:  Sleeps on 2 pillows Paroxysmal noctural dyspnea:  No Chest pain/pressure:  No Orthostatic lightheadedness:  Some episodes of lightheadedness but not significant.  Palpitations:  no Lower extremity edema:  no Presyncope/syncope:  no Cough:  no  Past Medical History:  Diagnosis Date   Asthma    Chest pressure 07/13/2012   Diabetes mellitus without complication (HCC)    DM (diabetes mellitus) ([place]) 07/13/2012   HTN (hypertension) 07/13/2012   Hypercholesterolemia    Hypertension    SOB (shortness of breath) 07/13/2012   Tachycardia, unspecified, "fluttering" 07/13/2012    Current Outpatient Medications  Medication Sig Dispense Refill   apixaban (ELIQUIS) 5 MG TABS tablet Take 1 tablet (5 mg total) by mouth 2 (two) times daily. 60 tablet 3   furosemide (LASIX) 20 MG tablet Take 1 tablet (20 mg total) by mouth daily as needed. 90 tablet 3   Lancets (ONETOUCH DELICA  PLUS [id number]) MISC      ONETOUCH VERIO test strip      rosuvastatin (CRESTOR) 40 MG tablet Take 1 tablet (40 mg total) by mouth daily. 90 tablet 2   sacubitril-valsartan (ENTRESTO) 24-26 MG Take 1 tablet by mouth 2 (two) times daily. 180 tablet 3   spironolactone (ALDACTONE) 25 MG tablet Take 0.5 tablets (12.5 mg total) by mouth daily. 45 tablet 3   metoprolol succinate (TOPROL-XL) 100 MG 24 hr tablet Take 1 tablet (100 mg total) by mouth daily. 90 tablet 3   No current facility-administered medications for this encounter.    Allergies  Allergen Reactions   Janumet [Sitagliptin-Metformin Hcl] Shortness Of Breath   Januvia [Sitagliptin] Shortness Of Breath   Kombiglyze [Saxagliptin-Metformin Er] Shortness Of Breath   Sulfa Antibiotics Shortness Of Breath   Elemental Sulfur Itching      Social History   Socioeconomic History   Marital status: Married    Spouse name: Not on file   Number of children: 2   Years of education: Not on file   Highest education level: Not on file  Occupational History   Occupation: retired  Tobacco Use   Smoking status: Former    Years: 2.00    Types: Cigarettes    Quit date: 05/13/1983    Years since quitting: 39.3   Smokeless tobacco: Never  Vaping Use   Vaping Use: Never used  Substance and Sexual Activity   Alcohol use: No   Drug use: No   Sexual activity: Yes  Other Topics Concern   Not on file  Social History Narrative   Not on file  Social Determinants of Health   Financial Resource Strain: Low Risk  (07/29/2022)   Overall Financial Resource Strain (CARDIA)    Difficulty of Paying Living Expenses: Not hard at all  Food Insecurity: No Food Insecurity (07/27/2022)   Hunger Vital Sign    Worried About Running Out of Food in the Last Year: Never true    Ran Out of Food in the Last Year: Never true  Transportation Needs: No Transportation Needs (07/27/2022)   PRAPARE - Hydrologist (Medical): No     Lack of Transportation (Non-Medical): No  Physical Activity: Inactive (04/23/2022)   Exercise Vital Sign    Days of Exercise per Week: 0 days    Minutes of Exercise per Session: 0 min  Stress: No Stress Concern Present (04/23/2022)   [place]    Feeling of Stress : Not at all  Social Connections: Not on file  Intimate Partner Violence: Not At Risk (07/27/2022)   Humiliation, Afraid, Rape, and Kick questionnaire    Fear of Current or Ex-Partner: No    Emotionally Abused: No    Physically Abused: No    Sexually Abused: No      Family History  Problem Relation Age of Onset   Heart attack Mother    Diabetes type II Mother    Stroke Mother    Alzheimer's disease Father    Diabetes type II Brother     PHYSICAL EXAM: Vitals:   09/22/22 0939  BP: (!) 90/50  Pulse: (!) 44  SpO2: 96%   GENERAL: Well nourished, well developed, and in no apparent distress at rest.  HEENT: Negative for arcus senilis or xanthelasma. There is no scleral icterus.  The mucous membranes are pink and moist.   NECK: Supple, No masses. Normal carotid upstrokes without bruits. No masses or thyromegaly.    CHEST: There are no chest wall deformities. There is no chest wall tenderness. Respirations are unlabored.  Lungs-CTA bilaterally CARDIAC:  JVP: 7 cm H2O         Normal S1, S2  Normal rate with regular rhythm. No murmurs, rubs or gallops.  Pulses are 2+ and symmetrical in upper and lower extremities.  No edema.  ABDOMEN: Soft, non-tender, non-distended. There are no masses or hepatomegaly. There are normal bowel sounds.  EXTREMITIES: Warm and well perfused with no cyanosis, clubbing.  LYMPHATIC: No axillary or supraclavicular lymphadenopathy.  NEUROLOGIC: Patient is oriented x3 with no focal or lateralizing neurologic deficits.  PSYCH: Patients affect is appropriate, there is no evidence of anxiety or depression.  SKIN: Warm and dry; no  lesions or wounds.   DATA REVIEW  ECG: 09/02/22: sinus bradycardia with pauses  ECHO: 12/23: LVEF 30-35%. Normal RV function.   CATH:N/A   ASSESSMENT & PLAN:  Heart Failure with reduced EF Etiology of [id number] tachyarrhythmia/afib mediated; plan for coronary CTA.   NYHA class / AHA Stage:III Volume status & Diuretics: Euvolemic continue Lasix 20 mg daily. Vasodilators:Entresto 24/27m IBD Beta-Blocker:d/c metoprolol tartrate, start succinate 1038mdaily; discontinue digoxin (elevated digoxin level previously; concerned that some of her symptoms may be secondary to digoxin, will obtain level today) MRA: continue spironolactone to 2535maily Cardiometabolic: start at follow up  Devices therapies & Valvulopathies:not indicated Advanced therapies:not indicated  2. Atrial fibrillation -S/P TEE/DCCV -On Eliquis 5 BID. Reports compliance -Consider sleep study in the future   3. HTN BP stable. Meds as above   4. HLD Crestor increased  to 40 mg daily during recent admit   5. DM II -Recent A1c 6.5% -Not requiring any agents   6. CVA -Recent R frontoparietal junction infarct 12/23. Likely cardioembolic from AF. Seen by Neurology.  -Referred to Neuro follow-up -No residual deficits  Aleea Hendry Advanced Heart Failure Mechanical Circulatory Support

## 2022-09-23 ENCOUNTER — Ambulatory Visit: Payer: Self-pay

## 2022-09-23 ENCOUNTER — Ambulatory Visit: Payer: Medicare Other | Admitting: Occupational Therapy

## 2022-09-23 DIAGNOSIS — I69354 Hemiplegia and hemiparesis following cerebral infarction affecting left non-dominant side: Secondary | ICD-10-CM

## 2022-09-23 DIAGNOSIS — M6281 Muscle weakness (generalized): Secondary | ICD-10-CM | POA: Diagnosis not present

## 2022-09-23 DIAGNOSIS — R278 Other lack of coordination: Secondary | ICD-10-CM

## 2022-09-23 NOTE — Patient Outreach (Signed)
  Care Coordination   09/23/2022 Name: Renee Bradley MRN: [id number] DOB: Apr 15, 1946   Care Coordination Outreach Attempts:  An unsuccessful telephone outreach was attempted for a scheduled appointment today.  Follow Up Plan:  Additional outreach attempts will be made to offer the patient care coordination information and services.   Encounter Outcome:  No Answer   Care Coordination Interventions:  No, not indicated    Barb Merino, RN, BSN, CCM Care Management Coordinator [facility] Care Management  Direct Phone: [phone]

## 2022-09-23 NOTE — Patient Instructions (Addendum)
RETURN TO DRIVING PLAN:   Communicate with physician first and get clearance before beginning any driving.   Once cleared:  WITH THE SUPERVISION OF A LICENSED DRIVER, PLEASE DRIVE IN AN EMPTY PARKING LOT FOR AT LEAST 2-3 TRIALS TO TEST REACTION TIME, VISION, USE OF EQUIPMENT IN CAR, ETC.   IF SUCCESSFUL WITH THE PARKING LOT DRIVING, PROCEED TO SUPERVISED DRIVING TRIALS IN YOUR NEIGHBORHOOD STREETS AT LOW TRAFFIC TIMES TO TEST OBSERVATION TO TRAFFIC SIGNALS, REACTION TIME, ETC. PLEASE ATTEMPT AT LEAST 2-3 TRIALS IN YOUR NEIGHBORHOOD.   IF NEIGHBORHOOD DRIVING IS SUCCESSFUL, YOU MAY PROCEED TO DRIVING IN BUSIER AREAS IN YOUR COMMUNITY WITH SUPERVISION OF A LICENSED DRIVER. PLEASE ATTEMPT AT LEAST 4-5 TRIALS.   IF COMMUNITY DRIVING IS SUCCESSFUL, YOU MAY PROCEED TO DRIVING ALONE, DURING THE DAY TIME, IN NON-PEAK TRAFFIC TIMES. YOU SHOULD DRIVE NO FURTHER THAN 15 MINUTES IN ONE DIRECTION.    Driving After a Stroke Driving after a stroke can be dangerous because a stroke can cause physical, emotional, cognitive, and behavioral changes. Damage to your brain and other parts of your nervous system may affect your ability to drive. You may have weakness, stiffness, and pain. You also may have problems moving, talking, seeing, touching, or problem-solving. A stroke can also cause an inability to move, or paralysis, on one side of your body. How does a stroke affect my driving? A family member may be the first to notice that it is not safe for you to drive. You may have problems with: Your vision. Talking and communicating. Weakness, pain, and stiffness in your arms or legs. Reacting quickly to changes on the road. Using the steering wheel, pedals, and other parts of the car. Thinking while driving. Judgment on the road. How do I recognize changes in my ability to drive? If you do drive, signs that driving may be unsafe for you include: Driving too fast or too slow. Needing help from others while  driving. Not paying attention to street signs or signals. Making bad decisions while driving. Not keeping enough distance between cars. Drifting into other lanes. Becoming confused, angry, or frustrated. Getting lost in familiar places. Having accidents while driving. What actions can I take to manage driving after a stroke?  Talk to your health care provider Ask your health care provider when it is safe for you to drive. Laws on driving after a stroke vary by state. Your health care provider may recommend that you: Get a driving evaluation to have your vision, thinking, reaction time, and driving skills tested. Take a driving rehabilitation program for people who have had a stroke. Take a driving class or a retraining program. Ask about safety devices for drivers Adaptive equipment refers to devices that can help people who have had a stroke to drive and do other activities. You may need: A wheelchair-accessible car. Special hand controls in the car. Pedal extensions for the car. A seat base to help you stay positioned in your seat. Lifts and ramps to help you get in and out of the car. Where to find more information American Stroke Association: www.stroke.org Summary Damage to your brain and other parts of your nervous system may affect your ability to drive. Ask your health care provider when it is safe for you to drive. They may recommend that you get a driving evaluation or take a driving class. A family member may be the first to notice that it is not safe for you to drive. You may need adaptive equipment  to drive safely. This information is not intended to replace advice given to you by your health care provider. Make sure you discuss any questions you have with your health care provider. Document Revised: 10/20/2021 Document Reviewed: 10/20/2021 Elsevier Patient Education  [place].
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
## 2022-09-27 NOTE — Progress Notes (Signed)
PATIENT: Renee Bradley DOB: Oct 27, 1945  REASON FOR VISIT: follow up HISTORY FROM: patient PRIMARY NEUROLOGIST:   HISTORY OF PRESENT ILLNESS: Today 09/27/22  Renee Bradley is a 77 y.o. female who has been followed in this office for ***. Returns today for follow-up.   HISTORY Renee Bradley is a 77 y.o. female with history of hypertension, diabetes and obesity who presented on 12/25 with numbness and clumsiness of her left arm.  She noted that she was unable to hold certain objects and endorsed numbness and tingling throughout the arm.  When patient arrived to the hospital, she was found to be in A-fib with RVR.  She was admitted to cardiology.  She is currently on metoprolol for rate control and heparin for anticoagulation.  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 Does not report any visual symptoms or headaches.   R frontoparietal junction infarct CTA head & neck shows negative CTA for large vessel occlusion or other emergent finding. Mild atheromatous change about the carotid siphons without hemodynamically significant stenosis. No other hemodynamically significant or correctable stenosis about the major arterial vasculature of the head and neck. MRI  shows acute infarction in the right frontoparietal junction region with edema and some petechial bleeding. The infarction measures approximately 3 cm in size. The possibility of an underlying mass lesion is considered but is felt unlikely. Chronic small-vessel ischemic changes elsewhere throughout the brain as outlined above. 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 2D Echo EF 30 - 35% LDL 78 HgbA1c 6.5 VTE prophylaxis - IV heparin  aspirin 81 mg daily prior to admission, now on heparin IV. May switch to apixaban once cleared from cardiology standpoint Therapy recommendations:  pending  evaluation Disposition:  pending therapy recs  REVIEW OF SYSTEMS: Out of a complete 14 system review of symptoms, the patient complains only of the following symptoms, and all other reviewed systems are negative.  ALLERGIES: Allergies  Allergen Reactions   Janumet [Sitagliptin-Metformin Hcl] Shortness Of Breath   Januvia [Sitagliptin] Shortness Of Breath   Kombiglyze [Saxagliptin-Metformin Er] Shortness Of Breath   Sulfa Antibiotics Shortness Of Breath   Elemental Sulfur Itching    HOME MEDICATIONS: Outpatient Medications Prior to Visit  Medication Sig Dispense Refill   apixaban (ELIQUIS) 5 MG TABS tablet Take 1 tablet (5 mg total) by mouth 2 (two) times daily. 60 tablet 3   furosemide (LASIX) 20 MG tablet Take 1 tablet (20 mg total) by mouth daily as needed. 90 tablet 3   Lancets (ONETOUCH DELICA PLUS 123XX123) MISC      metoprolol succinate (TOPROL-XL) 100 MG 24 hr tablet Take 1 tablet (100 mg total) by mouth daily. 90 tablet 3   ONETOUCH VERIO test strip      potassium chloride SA (KLOR-CON M) 20 MEQ tablet Take 1 tablet (20 mEq total) by mouth daily. 90 tablet 3   rosuvastatin (CRESTOR) 40 MG tablet Take 1 tablet (40 mg total) by mouth daily. 90 tablet 2   sacubitril-valsartan (ENTRESTO) 24-26 MG Take 1 tablet by mouth 2 (two) times daily. 180 tablet 3   spironolactone (ALDACTONE) 25 MG tablet Take 0.5 tablets (12.5 mg total) by mouth daily. 45 tablet 3   No facility-administered medications prior to visit.    PAST MEDICAL HISTORY: Past Medical History:  Diagnosis Date   Asthma    Chest pressure 07/13/2012   Diabetes mellitus without complication ([place])  DM (diabetes mellitus) ([place]) 07/13/2012   HTN (hypertension) 07/13/2012   Hypercholesterolemia    Hypertension    SOB (shortness of breath) 07/13/2012   Tachycardia, unspecified, "fluttering" 07/13/2012    PAST SURGICAL HISTORY: Past Surgical History:  Procedure Laterality Date   ABDOMINAL HYSTERECTOMY      CARDIOVERSION N/A 09/02/2022   Procedure: CARDIOVERSION;  Surgeon: Hebert Soho, DO;  Location: [facility];  Service: Cardiovascular;  Laterality: N/A;   TEE WITHOUT CARDIOVERSION N/A 09/02/2022   Procedure: TRANSESOPHAGEAL ECHOCARDIOGRAM (TEE);  Surgeon: Hebert Soho, DO;  Location: [facility]COPY;  Service: Cardiovascular;  Laterality: N/A;    FAMILY HISTORY: Family History  Problem Relation Age of Onset   Heart attack Mother    Diabetes type II Mother    Stroke Mother    Alzheimer's disease Father    Diabetes type II Brother     SOCIAL HISTORY: Social History   Socioeconomic History   Marital status: Married    Spouse name: Not on file   Number of children: 2   Years of education: Not on file   Highest education level: Not on file  Occupational History   Occupation: retired  Tobacco Use   Smoking status: Former    Years: 2.00    Types: Cigarettes    Quit date: 05/13/1983    Years since quitting: 39.4   Smokeless tobacco: Never  Vaping Use   Vaping Use: Never used  Substance and Sexual Activity   Alcohol use: No   Drug use: No   Sexual activity: Yes  Other Topics Concern   Not on file  Social History Narrative   Not on file   Social Determinants of Health   Financial Resource Strain: Low Risk  (07/29/2022)   Overall Financial Resource Strain (CARDIA)    Difficulty of Paying Living Expenses: Not hard at all  Food Insecurity: No Food Insecurity (07/27/2022)   Hunger Vital Sign    Worried About Running Out of Food in the Last Year: Never true    [place] in the Last Year: Never true  Transportation Needs: No Transportation Needs (07/27/2022)   PRAPARE - Hydrologist (Medical): No    Lack of Transportation (Non-Medical): No  Physical Activity: Inactive (04/23/2022)   Exercise Vital Sign    Days of Exercise per Week: 0 days    Minutes of Exercise per Session: 0 min  Stress: No Stress Concern Present (04/23/2022)    [place]    Feeling of Stress : Not at all  Social Connections: Not on file  Intimate Partner Violence: Not At Risk (07/27/2022)   Humiliation, Afraid, Rape, and Kick questionnaire    Fear of Current or Ex-Partner: No    Emotionally Abused: No    Physically Abused: No    Sexually Abused: No      PHYSICAL EXAM  There were no vitals filed for this visit. There is no height or weight on file to calculate BMI.  Generalized: Well developed, in no acute distress   Neurological examination  Mentation: Alert oriented to time, place, history taking. Follows all commands speech and language fluent Cranial nerve II-XII: Pupils were equal round reactive to light. Extraocular movements were full, visual field were full on confrontational test. Facial sensation and strength were normal. Uvula tongue midline. Head turning and shoulder shrug  were normal and symmetric. Motor: The motor testing reveals 5 over 5 strength of all 4  extremities. Good symmetric motor tone is noted throughout.  Sensory: Sensory testing is intact to soft touch on all 4 extremities. No evidence of extinction is noted.  Coordination: Cerebellar testing reveals good finger-nose-finger and heel-to-shin bilaterally.  Gait and station: Gait is normal. Tandem gait is normal. Romberg is negative. No drift is seen.  Reflexes: Deep tendon reflexes are symmetric and normal bilaterally.   DIAGNOSTIC DATA (LABS, IMAGING, TESTING) - I reviewed patient records, labs, notes, testing and imaging myself where available.  Lab Results  Component Value Date   WBC 3.9 (L) 08/18/2022   HGB 15.0 09/02/2022   HCT 44.0 09/02/2022   MCV 87.7 08/18/2022   PLT 174 08/18/2022      Component Value Date/Time   NA 145 09/22/2022 1035   NA 139 08/06/2022 1644   K 2.6 (LL) 09/22/2022 1035   CL 103 09/22/2022 1035   CO2 29 09/22/2022 1035   GLUCOSE 134 (H) 09/22/2022 1035   BUN  18 09/22/2022 1035   BUN 23 08/06/2022 1644   CREATININE 1.41 (H) 09/22/2022 1035   CREATININE 0.86 09/30/2015 1653   CALCIUM 9.2 09/22/2022 1035   PROT 6.5 08/18/2022 1238   PROT 6.3 04/23/2022 1122   ALBUMIN 4.0 08/18/2022 1238   ALBUMIN 4.5 04/23/2022 1122   AST 16 08/18/2022 1238   ALT 12 08/18/2022 1238   ALKPHOS 57 08/18/2022 1238   BILITOT 1.9 (H) 08/18/2022 1238   BILITOT 1.4 (H) 04/23/2022 1122   GFRNONAA 39 (L) 09/22/2022 1035   GFRAA 75 08/13/2020 1559   Lab Results  Component Value Date   CHOL 144 07/29/2022   HDL 55 07/29/2022   LDLCALC 78 07/29/2022   TRIG 55 07/29/2022   CHOLHDL 2.6 07/29/2022   Lab Results  Component Value Date   HGBA1C 6.5 (H) 07/29/2022   Lab Results  Component Value Date   VITAMINB12 382 04/23/2022   Lab Results  Component Value Date   TSH 1.115 07/30/2022      ASSESSMENT AND PLAN 77 y.o. year old female  has a past medical history of Asthma, Chest pressure (07/13/2012), Diabetes mellitus without complication ([place]), DM (diabetes mellitus) ([place]) (07/13/2012), HTN (hypertension) (07/13/2012), Hypercholesterolemia, Hypertension, SOB (shortness of breath) (07/13/2012), and Tachycardia, unspecified, "fluttering" (07/13/2012). here with:    *** : Residual deficit: ***. Continue Eliquis (apixaban) daily  and ***  for secondary stroke prevention.  Discussed secondary stroke prevention measures and importance of close PCP follow up for aggressive stroke risk factor management. I have gone over the pathophysiology of stroke, warning signs and symptoms, risk factors and their management in some detail with instructions to go to the closest emergency room for symptoms of concern. HTN: BP goal <130/90.  HLD: LDL goal <70. Recent LDL 78.  DMII: A1c goal<7.0. Recent A1c 6.5.  Encouraged patient to monitor diet and encouraged exercise FU with our office ***      Ward Givens, MSN, NP-C 09/27/2022, 4:44 PM [facility] [address] [place], [place] [address] [phone]

## 2022-09-28 ENCOUNTER — Ambulatory Visit (INDEPENDENT_AMBULATORY_CARE_PROVIDER_SITE_OTHER): Payer: Medicare Other | Admitting: Adult Health

## 2022-09-28 ENCOUNTER — Encounter: Payer: Self-pay | Admitting: Adult Health

## 2022-09-28 VITALS — BP 108/53 | HR 51 | Ht 62.0 in | Wt 167.8 lb

## 2022-09-28 DIAGNOSIS — E785 Hyperlipidemia, unspecified: Secondary | ICD-10-CM

## 2022-09-28 DIAGNOSIS — I639 Cerebral infarction, unspecified: Secondary | ICD-10-CM | POA: Diagnosis not present

## 2022-09-28 DIAGNOSIS — I4891 Unspecified atrial fibrillation: Secondary | ICD-10-CM

## 2022-09-28 NOTE — Progress Notes (Signed)
I agree with the above plan 

## 2022-09-29 ENCOUNTER — Ambulatory Visit (HOSPITAL_COMMUNITY)
Admission: RE | Admit: 2022-09-29 | Discharge: 2022-09-29 | Disposition: A | Discharge status: Home / Self Care | Payer: Medicare Other | Source: Ambulatory Visit | Attending: Cardiology | Admitting: Cardiology

## 2022-09-29 DIAGNOSIS — I5022 Chronic systolic (congestive) heart failure: Secondary | ICD-10-CM

## 2022-09-29 LAB — BASIC METABOLIC PANEL
Anion gap: 13 (ref 5–15)
BUN: 13 mg/dL (ref 8–23)
CO2: 26 mmol/L (ref 22–32)
Calcium: 9.8 mg/dL (ref 8.9–10.3)
Chloride: 108 mmol/L (ref 98–111)
Creatinine, Ser: 1.52 mg/dL — ABNORMAL HIGH (ref 0.44–1.00)
GFR, Estimated: 36 mL/min — ABNORMAL LOW (ref >60)
Glucose, Bld: 115 mg/dL — ABNORMAL HIGH (ref 70–99)
Potassium: 3.5 mmol/L (ref 3.5–5.1)
Sodium: 147 mmol/L — ABNORMAL HIGH (ref 135–145)

## 2022-10-06 ENCOUNTER — Telehealth: Payer: Self-pay | Admitting: *Deleted

## 2022-10-06 NOTE — Progress Notes (Signed)
  Care Coordination Note  10/06/2022 Name: FIORE KUTZKE MRN: [id number] DOB: [birth year]  [name] is a 77 y.o. year old female who is a primary care patient of Glendale Chard, MD and is actively engaged with the care management team. I reached out to [place] by phone today to assist with re-scheduling a follow up visit with the RN Case Manager  Follow up plan: Unsuccessful telephone outreach attempt made. A HIPAA compliant phone message was left for the patient providing contact information and requesting a return call.  [place]  Direct Dial: [phone]

## 2022-10-12 NOTE — Progress Notes (Signed)
  Care Coordination Note  10/12/2022 Name: KITIARA HINTZE MRN: [id number] DOB: [birth year]  [name] is a 77 y.o. year old female who is a primary care patient of Glendale Chard, MD and is actively engaged with the care management team. I reached out to [place] by phone today to assist with re-scheduling a follow up visit with the RN Case Manager  Follow up plan: Unsuccessful telephone outreach attempt made. A HIPAA compliant phone message was left for the patient providing contact information and requesting a return call. We have been unable to make contact with the patient for follow up.    [place]  Direct Dial: [phone]

## 2022-10-13 ENCOUNTER — Encounter (HOSPITAL_COMMUNITY): Payer: Self-pay | Admitting: Cardiology

## 2022-10-13 ENCOUNTER — Ambulatory Visit (HOSPITAL_BASED_OUTPATIENT_CLINIC_OR_DEPARTMENT_OTHER)
Admission: RE | Admit: 2022-10-13 | Discharge: 2022-10-13 | Disposition: A | Discharge status: Home / Self Care | Payer: Medicare Other | Source: Ambulatory Visit | Attending: Cardiology | Admitting: Cardiology

## 2022-10-13 ENCOUNTER — Ambulatory Visit (HOSPITAL_COMMUNITY)
Admission: RE | Admit: 2022-10-13 | Discharge: 2022-10-13 | Disposition: A | Discharge status: Home / Self Care | Payer: Medicare Other | Source: Ambulatory Visit | Attending: Internal Medicine | Admitting: Internal Medicine

## 2022-10-13 ENCOUNTER — Other Ambulatory Visit (HOSPITAL_COMMUNITY): Payer: Self-pay

## 2022-10-13 VITALS — BP 114/60 | HR 50 | Wt 169.6 lb

## 2022-10-13 DIAGNOSIS — E119 Type 2 diabetes mellitus without complications: Secondary | ICD-10-CM | POA: Diagnosis not present

## 2022-10-13 DIAGNOSIS — Z7901 Long term (current) use of anticoagulants: Secondary | ICD-10-CM | POA: Insufficient documentation

## 2022-10-13 DIAGNOSIS — I110 Hypertensive heart disease with heart failure: Secondary | ICD-10-CM | POA: Insufficient documentation

## 2022-10-13 DIAGNOSIS — I4891 Unspecified atrial fibrillation: Secondary | ICD-10-CM | POA: Insufficient documentation

## 2022-10-13 DIAGNOSIS — Z8673 Personal history of transient ischemic attack (TIA), and cerebral infarction without residual deficits: Secondary | ICD-10-CM | POA: Insufficient documentation

## 2022-10-13 DIAGNOSIS — I5022 Chronic systolic (congestive) heart failure: Secondary | ICD-10-CM

## 2022-10-13 DIAGNOSIS — J45909 Unspecified asthma, uncomplicated: Secondary | ICD-10-CM | POA: Diagnosis not present

## 2022-10-13 DIAGNOSIS — Z7984 Long term (current) use of oral hypoglycemic drugs: Secondary | ICD-10-CM | POA: Diagnosis not present

## 2022-10-13 DIAGNOSIS — Z79899 Other long term (current) drug therapy: Secondary | ICD-10-CM | POA: Diagnosis not present

## 2022-10-13 DIAGNOSIS — I4811 Longstanding persistent atrial fibrillation: Secondary | ICD-10-CM | POA: Diagnosis not present

## 2022-10-13 DIAGNOSIS — E669 Obesity, unspecified: Secondary | ICD-10-CM | POA: Insufficient documentation

## 2022-10-13 DIAGNOSIS — R5383 Other fatigue: Secondary | ICD-10-CM | POA: Insufficient documentation

## 2022-10-13 DIAGNOSIS — E785 Hyperlipidemia, unspecified: Secondary | ICD-10-CM | POA: Diagnosis not present

## 2022-10-13 LAB — ECHOCARDIOGRAM COMPLETE
AR max vel: 2.76 cm2
AV Area VTI: 2.63 cm2
AV Area mean vel: 2.49 cm2
AV Mean grad: 3 mmHg
AV Peak grad: 5.7 mmHg
Ao pk vel: 1.19 m/s
Area-P 1/2: 2.62 cm2
Calc EF: 50.1 %
MV VTI: 2.23 cm2
S' Lateral: 2.9 cm
Single Plane A2C EF: 47.4 %
Single Plane A4C EF: 50.7 %

## 2022-10-13 LAB — BASIC METABOLIC PANEL
Anion gap: 8 (ref 5–15)
BUN: 6 mg/dL — ABNORMAL LOW (ref 8–23)
CO2: 25 mmol/L (ref 22–32)
Calcium: 9.5 mg/dL (ref 8.9–10.3)
Chloride: 107 mmol/L (ref 98–111)
Creatinine, Ser: 1.04 mg/dL — ABNORMAL HIGH (ref 0.44–1.00)
GFR, Estimated: 56 mL/min — ABNORMAL LOW (ref >60)
Glucose, Bld: 102 mg/dL — ABNORMAL HIGH (ref 70–99)
Potassium: 4.1 mmol/L (ref 3.5–5.1)
Sodium: 140 mmol/L (ref 135–145)

## 2022-10-13 LAB — BRAIN NATRIURETIC PEPTIDE: B Natriuretic Peptide: 136 pg/mL — ABNORMAL HIGH (ref 0.0–100.0)

## 2022-10-13 MED ORDER — EMPAGLIFLOZIN 10 MG PO TABS: 10.0000 mg | ORAL_TABLET | Freq: Every day | ORAL | 11 refills | Status: DC

## 2022-10-13 NOTE — Patient Instructions (Signed)
TAKE Lasix 20 mg today and tomorrow  START  Labs done today, your results will be available in MyChart, we will contact you for abnormal readings.  Your physician recommends that you schedule a follow-up appointment in: 4 months (July ) ** please call the office in May to arrange your follow up appointment. **  If you have any questions or concerns before your next appointment please send [place] a message through [place] or call our office at [phone].    TO LEAVE A MESSAGE FOR THE NURSE SELECT OPTION 2, PLEASE LEAVE A MESSAGE INCLUDING: YOUR NAME DATE OF BIRTH CALL BACK NUMBER REASON FOR CALL**this is important as we prioritize the call backs  YOU WILL RECEIVE A CALL BACK THE SAME DAY AS LONG AS YOU CALL BEFORE 4:00 PM At the [facility], you and your health needs are our priority. As part of our continuing mission to provide you with exceptional heart care, we have created designated Provider Care Teams. These Care Teams include your primary Cardiologist (physician) and Advanced Practice Providers (APPs- Physician Assistants and Nurse Practitioners) who all work together to provide you with the care you need, when you need it.   You may see any of the following providers on your designated Care Team at your next follow up: Dr Glori Bickers Dr Loralie Champagne Dr. Roxana Hires, NP Lyda Jester, [place] [facility] [place], [place] Forestine Na, NP Audry Riles, PharmD   Please be sure to bring in all your medications bottles to every appointment.    Thank you for choosing [facility]

## 2022-10-13 NOTE — Progress Notes (Signed)
ADVANCED HEART FAILURE CLINIC NOTE  Referring Physician: Glendale Chard, MD  Primary Care: Glendale Chard, MD   HPI: Renee Bradley is a 77 y.o. 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  Her December 2023 when she presented with acute stroke, atrial fibrillation and newly diagnosed heart failure with reduced ejection fraction.  During her admission she was started on low-dose GDMT and since that time has undergone TEE cardioversion.  She was seen in [facility] clinic where GDMT was further uptitrated.  Interval hx:  From a functional standpoint, has continued to improve since TEE/DCCV. She is no longer having episodes of shortness of breath or palpitations. She still feels fatigued some days of the week but otherwise is doing very well. No difficulty with ADLs, very minimal lightheadedness.   Activity level/exercise tolerance:  NYHA II Paroxysmal noctural dyspnea:  No Chest pain/pressure:  No Orthostatic lightheadedness:  minimal Palpitations:  no Lower extremity edema:  mild edema around ankles.  Presyncope/syncope:  no Cough:  no  Past Medical History:  Diagnosis Date   Asthma    Chest pressure 07/13/2012   Diabetes mellitus without complication (H[facility])    DM (diabetes mellitus) ([place]) 07/13/2012   HTN (hypertension) 07/13/2012   Hypercholesterolemia    Hypertension    SOB (shortness of breath) 07/13/2012   Tachycardia, unspecified, "fluttering" 07/13/2012    Current Outpatient Medications  Medication Sig Dispense Refill   apixaban (ELIQUIS) 5 MG TABS tablet Take 1 tablet (5 mg total) by mouth 2 (two) times daily. 60 tablet 3   furosemide (LASIX) 20 MG tablet Take 1 tablet (20 mg total) by mouth daily as needed. 90 tablet 3   Lancets (ONETOUCH DELICA PLUS 123XX123)  MISC      metoprolol succinate (TOPROL-XL) 100 MG 24 hr tablet Take 1 tablet (100 mg total) by mouth daily. 90 tablet 3   ONETOUCH VERIO test strip      potassium chloride SA (KLOR-CON M) 20 MEQ tablet Take 1 tablet (20 mEq total) by mouth daily. 90 tablet 3   rosuvastatin (CRESTOR) 40 MG tablet Take 1 tablet (40 mg total) by mouth daily. 90 tablet 2   sacubitril-valsartan (ENTRESTO) 24-26 MG Take 1 tablet by mouth 2 (two) times daily. 180 tablet 3   spironolactone (ALDACTONE) 25 MG tablet Take 0.5 tablets (12.5 mg total) by mouth daily. 45 tablet 3   No current facility-administered medications for this encounter.    Allergies  Allergen Reactions   Janumet [Sitagliptin-Metformin Hcl] Shortness Of Breath   Januvia [Sitagliptin] Shortness Of Breath   Kombiglyze [Saxagliptin-Metformin Er] Shortness Of Breath   Sulfa Antibiotics Shortness Of Breath   Elemental Sulfur Itching      Social History   Socioeconomic History   Marital status: Married    Spouse name: Not on file   Number of children: 2   Years of education: Not on file   Highest education level: Not on file  Occupational History   Occupation: retired  Tobacco Use   Smoking status: Former    Years: 2.00    Types: Cigarettes    Quit date: 05/13/1983    Years since quitting: 39.4   Smokeless tobacco: Never  Vaping Use   Vaping Use: Never used  Substance and Sexual Activity   Alcohol use: No   Drug use: No  Sexual activity: Yes  Other Topics Concern   Not on file  Social History Narrative   Not on file   Social Determinants of Health   Financial Resource Strain: Low Risk  (07/29/2022)   Overall Financial Resource Strain (CARDIA)    Difficulty of Paying Living Expenses: Not hard at all  Food Insecurity: No Food Insecurity (07/27/2022)   Hunger Vital Sign    Worried About Running Out of Food in the Last Year: Never true    Ran Out of Food in the Last Year: Never true  Transportation Needs: No Transportation  Needs (07/27/2022)   PRAPARE - Hydrologist (Medical): No    Lack of Transportation (Non-Medical): No  Physical Activity: Inactive (04/23/2022)   Exercise Vital Sign    Days of Exercise per Week: 0 days    Minutes of Exercise per Session: 0 min  Stress: No Stress Concern Present (04/23/2022)   [place]    Feeling of Stress : Not at all  Social Connections: Not on file  Intimate Partner Violence: Not At Risk (07/27/2022)   Humiliation, Afraid, Rape, and Kick questionnaire    Fear of Current or Ex-Partner: No    Emotionally Abused: No    Physically Abused: No    Sexually Abused: No      Family History  Problem Relation Age of Onset   Heart attack Mother    Diabetes type II Mother    Stroke Mother    Alzheimer's disease Father    Diabetes type II Brother     PHYSICAL EXAM: Vitals:   10/13/22 1505  BP: 114/60  Pulse: (!) 50  SpO2: 95%   GENERAL: Well nourished, well developed, and in no apparent distress at rest.  HEENT: Negative for arcus senilis or xanthelasma. There is no scleral icterus.  The mucous membranes are pink and moist.   NECK: Supple, No masses. Normal carotid upstrokes without bruits. No masses or thyromegaly.    CHEST: There are no chest wall deformities. There is no chest wall tenderness. Respirations are unlabored.  Lungs- cta b/l CARDIAC:  JVP: 8 cm          Normal rate with regular rhythm. No murmurs, rubs or gallops.  Pulses are 2+ and symmetrical in upper and lower extremities. +1 edema around ankles.   ABDOMEN: Soft, non-tender, non-distended. There are no masses or hepatomegaly. There are normal bowel sounds.  EXTREMITIES: Warm and well perfused with no cyanosis, clubbing.  LYMPHATIC: No axillary or supraclavicular lymphadenopathy.  NEUROLOGIC: Patient is oriented x3 with no focal or lateralizing neurologic deficits.  PSYCH: Patients affect is appropriate,  there is no evidence of anxiety or depression.  SKIN: Warm and dry; no lesions or wounds.    DATA REVIEW  ECG: 09/02/22: sinus bradycardia with pauses  ECHO: 12/23: LVEF 30-35%. Normal RV function.  10/13/22: LVEF 45%. Normal RV function. Trivial MR.   CATH:N/A   ASSESSMENT & PLAN:  Heart Failure with reduced EF Etiology of [id number] tachyarrhythmia/afib mediated; plan for coronary CTA.   NYHA class / AHA Stage:III Volume status & Diuretics: Euvolemic continue Lasix 20 mg daily. Vasodilators:Entresto 24/'26mg'$  IBD Beta-Blocker:d/c metoprolol tartrate, continue metoprolol succinate; pulse on my exam in 60s. No lightheadedness; will decrease if she remains bradycardic.  MRA: continue spironolactone to '25mg'$  daily Cardiometabolic: start farxiga '10mg'$  Devices therapies & Valvulopathies:not indicated Advanced therapies:not indicated  2. Atrial fibrillation -S/P TEE/DCCV -On Eliquis 5 BID. Reports  compliance -Consider sleep study in the future   3. HTN BP stable. Meds as above   4. HLD Crestor increased to 40 mg daily during recent admit   5. DM II -Recent A1c 6.5% - start farxiga '10mg'$     6. CVA -Recent R frontoparietal junction infarct 12/23. Likely cardioembolic from AF. Seen by Neurology.  -Referred to Neuro follow-up -No residual deficits  Denette Hass Advanced Heart Failure Mechanical Circulatory Support

## 2022-10-13 NOTE — Progress Notes (Signed)
*  PRELIMINARY RESULTS* Echocardiogram 2D Echocardiogram has been performed.  Renee Bradley 10/13/2022, 3:01 PM

## 2022-10-14 ENCOUNTER — Telehealth (HOSPITAL_COMMUNITY): Payer: Self-pay

## 2022-10-14 LAB — ECHO TEE

## 2022-10-14 NOTE — Telephone Encounter (Signed)
Patient called and stated she wants to talk more about the Jardiance and see if  you can prescribe something else. She does not really want to take along with her other diabetic medications. I could not get much information from her, other than she wants to speak to you about it if possible.

## 2022-10-15 ENCOUNTER — Ambulatory Visit: Payer: Medicare Other | Admitting: Internal Medicine

## 2022-10-18 ENCOUNTER — Other Ambulatory Visit (HOSPITAL_COMMUNITY): Payer: Self-pay | Admitting: Cardiology

## 2022-10-26 ENCOUNTER — Ambulatory Visit (INDEPENDENT_AMBULATORY_CARE_PROVIDER_SITE_OTHER): Payer: Medicare Other | Admitting: Internal Medicine

## 2022-10-26 ENCOUNTER — Encounter: Payer: Self-pay | Admitting: Internal Medicine

## 2022-10-26 VITALS — BP 120/70 | HR 52 | Temp 98.3°F | Ht 62.0 in | Wt 170.0 lb

## 2022-10-26 DIAGNOSIS — E7800 Pure hypercholesterolemia, unspecified: Secondary | ICD-10-CM

## 2022-10-26 DIAGNOSIS — N1831 Chronic kidney disease, stage 3a: Secondary | ICD-10-CM

## 2022-10-26 DIAGNOSIS — I5020 Unspecified systolic (congestive) heart failure: Secondary | ICD-10-CM

## 2022-10-26 DIAGNOSIS — I130 Hypertensive heart and chronic kidney disease with heart failure and stage 1 through stage 4 chronic kidney disease, or unspecified chronic kidney disease: Secondary | ICD-10-CM

## 2022-10-26 DIAGNOSIS — E1122 Type 2 diabetes mellitus with diabetic chronic kidney disease: Secondary | ICD-10-CM

## 2022-10-26 DIAGNOSIS — I4891 Unspecified atrial fibrillation: Secondary | ICD-10-CM

## 2022-10-26 DIAGNOSIS — Z2821 Immunization not carried out because of patient refusal: Secondary | ICD-10-CM | POA: Diagnosis not present

## 2022-10-26 NOTE — Progress Notes (Signed)
Barnet Glasgow Martin,acting as a Education administrator for [name], MD.,have documented all relevant documentation on the behalf of [name], MD,as directed by  [name], MD while in the presence of [name], MD.    Subjective:     Patient ID: KEN OTOOLE , female    DOB: Feb 08, 1946 , 77 y.o.   MRN: [id number]   Chief Complaint  Patient presents with   Diabetes   Hypertension    HPI  She presents today for diabetes and htn check. She reports compliance with meds. She denies chest pain and shortness of breath.  Patient states for the past 3 days she has had a headache after taking her potassium chloride. BP Readings from Last 3 Encounters: 10/26/22 : 120/70 10/13/22 : 114/60 09/28/22 : (!) 108/53     Diabetes She presents for her follow-up diabetic visit. She has type 2 diabetes mellitus. Her disease course has been stable. Hypoglycemia symptoms include headaches. There are no diabetic associated symptoms. Pertinent negatives for diabetes include no blurred vision, no chest pain, no polydipsia, no polyphagia and no polyuria. There are no hypoglycemic complications. Diabetic complications include nephropathy. Risk factors for coronary artery disease include diabetes mellitus, dyslipidemia, hypertension, post-menopausal, sedentary lifestyle and obesity. She is compliant with treatment some of the time. Her weight is fluctuating minimally. She is following a diabetic diet. She participates in exercise intermittently. An ACE inhibitor/angiotensin II receptor blocker is not being taken.  Hypertension This is a chronic problem. The current episode started more than 1 year ago. The problem has been gradually improving since onset. The problem is uncontrolled. Associated symptoms include headaches. Pertinent negatives include no blurred vision or chest pain. The current treatment provides moderate improvement. Hypertensive end-organ damage includes kidney disease.     Past  Medical History:  Diagnosis Date   Asthma    Chest pressure 07/13/2012   Diabetes mellitus without complication (HCC)    DM (diabetes mellitus) ([place]) 07/13/2012   HTN (hypertension) 07/13/2012   Hypercholesterolemia    Hypertension    SOB (shortness of breath) 07/13/2012   Tachycardia, unspecified, "fluttering" 07/13/2012     Family History  Problem Relation Age of Onset   Heart attack Mother    Diabetes type II Mother    Stroke Mother    Alzheimer's disease Father    Diabetes type II Brother      Current Outpatient Medications:    apixaban (ELIQUIS) 5 MG TABS tablet, Take 1 tablet (5 mg total) by mouth 2 (two) times daily., Disp: 60 tablet, Rfl: 3   empagliflozin (JARDIANCE) 10 MG TABS tablet, Take 1 tablet (10 mg total) by mouth daily before breakfast., Disp: 30 tablet, Rfl: 11   furosemide (LASIX) 20 MG tablet, Take 1 tablet (20 mg total) by mouth daily as needed., Disp: 90 tablet, Rfl: 3   Lancets (ONETOUCH DELICA PLUS [id number]) MISC, , Disp: , Rfl:    metoprolol succinate (TOPROL-XL) 100 MG 24 hr tablet, TAKE 1 TABLET BY MOUTH EVERY DAY, Disp: 30 tablet, Rfl: 6   ONETOUCH VERIO test strip, , Disp: , Rfl:    potassium chloride SA (KLOR-CON M) 20 MEQ tablet, Take 1 tablet (20 mEq total) by mouth daily., Disp: 90 tablet, Rfl: 3   rosuvastatin (CRESTOR) 40 MG tablet, Take 1 tablet (40 mg total) by mouth daily., Disp: 90 tablet, Rfl: 2   sacubitril-valsartan (ENTRESTO) 24-26 MG, Take 1 tablet by mouth 2 (two) times daily., Disp: 180 tablet, Rfl: 3  spironolactone (ALDACTONE) 25 MG tablet, Take 0.5 tablets (12.5 mg total) by mouth daily., Disp: 45 tablet, Rfl: 3   Allergies  Allergen Reactions   Janumet [Sitagliptin-Metformin Hcl] Shortness Of Breath   Januvia [Sitagliptin] Shortness Of Breath   Kombiglyze [Saxagliptin-Metformin Er] Shortness Of Breath   Sulfa Antibiotics Shortness Of Breath   Elemental Sulfur Itching     Review of Systems  Constitutional: Negative.    HENT: Negative.    Eyes: Negative.  Negative for blurred vision.  Respiratory: Negative.    Cardiovascular: Negative.  Negative for chest pain.  Gastrointestinal: Negative.   Endocrine: Negative for polydipsia, polyphagia and polyuria.  Skin: Negative.   Neurological:  Positive for headaches.       She c/o intermittent headaches. Thinks it is due to potassium pills. Described as dull, throbbing. States she did not have them prior to starting this medication. Denies visual disturbance, ue/le weakness/paresthesias.      Today's Vitals   10/26/22 1622  BP: 120/70  Pulse: (!) 52  Temp: 98.3 F (36.8 C)  TempSrc: Oral  Weight: 170 lb (77.1 kg)  Height: 5\' 2"  (1.575 m)  PainSc: 0-No pain   Body mass index is 31.09 kg/m.  Wt Readings from Last 3 Encounters:  10/26/22 170 lb (77.1 kg)  10/13/22 169 lb 9.6 oz (76.9 kg)  09/28/22 167 lb 12.8 oz (76.1 kg)    Objective:  Physical Exam Vitals and nursing note reviewed.  Constitutional:      Appearance: Normal appearance.  HENT:     Head: Normocephalic and atraumatic.     Nose:     Comments: Masked     Mouth/Throat:     Comments: masked Eyes:     Extraocular Movements: Extraocular movements intact.  Cardiovascular:     Rate and Rhythm: Normal rate and regular rhythm.     Heart sounds: Normal heart sounds.  Pulmonary:     Effort: Pulmonary effort is normal.     Breath sounds: Normal breath sounds.  Musculoskeletal:     Cervical back: Normal range of motion.  Skin:    General: Skin is warm.  Neurological:     General: No focal deficit present.     Mental Status: She is alert.  Psychiatric:        Mood and Affect: Mood normal.        Behavior: Behavior normal.      Assessment And Plan:     1. Type 2 diabetes mellitus with stage 3a chronic kidney disease, without long-term current use of insulin (HCC) Comments: Chronic, not yet on meds. She has been quite resistant to start meds. Jardiance started by Cardiology, she  does not wish to start at this time. States she has seen the commercials and she is concerned about the side effects. Pt reminded that not everyone experiences the side effects. We also discussed the possible side effects from persistently untreated diabetes.  - Hemoglobin A1c  2. Hypertensive heart and renal disease with renal failure, stage 1 through stage 4 or unspecified chronic kidney disease, with heart failure ([place]) Comments: Chronic, well controlled. She will c/w spironolactone, Entresto, furosemide and metoprolol daily. Importance of salt restriction was d/w pt.  3. HFrEF (heart failure with reduced ejection fraction) ([place]) Comments: Chronic, recent Cardiology note reviewed. Jardiance recently added; however, she wishes to discuss with Cardiology further. I spent more than 15 minutes discussing why this medication is important and explained the cardiovascular and renal benefits. Despite this information, she still  declines to start this medication and wishes to discuss w/ Cardiology further.   4. Atrial fibrillation with RVR (HCC) Comments: She is currently in sinus rhythm. Properly anticoagulated w/ Eliquis.  5. Pure hypercholesterolemia Comments: Chronic, LDL goal < 70.  She will c/w rosuvastatin 40mg  daily. She is encouraged to gradually incorporate more exercise into her daily routine.  6. Herpes zoster vaccination declined     Patient was given opportunity to ask questions. Patient verbalized understanding of the plan and was able to repeat key elements of the plan. All questions were answered to their satisfaction.   I, [name], MD, have reviewed all documentation for this visit. The documentation on 10/26/22 for the exam, diagnosis, procedures, and orders are all accurate and complete.   IF YOU HAVE BEEN REFERRED TO A SPECIALIST, IT MAY TAKE 1-2 WEEKS TO SCHEDULE/PROCESS THE REFERRAL. IF YOU HAVE NOT HEARD FROM US/SPECIALIST IN TWO WEEKS, PLEASE GIVE [place] A CALL AT  [phone].   THE PATIENT IS ENCOURAGED TO PRACTICE SOCIAL DISTANCING DUE TO THE COVID-19 PANDEMIC.

## 2022-10-26 NOTE — Patient Instructions (Signed)

## 2022-10-27 ENCOUNTER — Other Ambulatory Visit: Payer: Self-pay | Admitting: Cardiology

## 2022-10-27 LAB — HEMOGLOBIN A1C
Est. average glucose Bld gHb Est-mCnc: 134 mg/dL
Hgb A1c MFr Bld: 6.3 % — ABNORMAL HIGH (ref 4.8–5.6)

## 2022-10-28 ENCOUNTER — Other Ambulatory Visit: Payer: Medicare Other

## 2022-11-16 ENCOUNTER — Other Ambulatory Visit: Payer: Self-pay | Admitting: Internal Medicine

## 2022-11-17 ENCOUNTER — Telehealth (HOSPITAL_COMMUNITY): Payer: Self-pay

## 2022-11-17 NOTE — Telephone Encounter (Signed)
Patient called and stated she has  been having headaches daily for several weeks and is not sure if any of her medication is causing it. She has a history of multiple strokes. She is not checking her BP at home due to not having a cuff. She denies any other symptoms.  Could it be any medications, or should she reach out to her PCP?

## 2022-11-17 NOTE — Telephone Encounter (Signed)
I called patient and let her know update.  She is going to call PCP to follow up.

## 2022-11-22 ENCOUNTER — Other Ambulatory Visit: Payer: Self-pay

## 2022-11-22 ENCOUNTER — Emergency Department (HOSPITAL_COMMUNITY): Payer: Medicare Other

## 2022-11-22 ENCOUNTER — Encounter (HOSPITAL_COMMUNITY): Payer: Self-pay | Admitting: Emergency Medicine

## 2022-11-22 ENCOUNTER — Emergency Department (HOSPITAL_COMMUNITY)
Admission: EM | Admit: 2022-11-22 | Discharge: 2022-11-22 | Disposition: A | Discharge status: Home / Self Care | Payer: TRICARE For Life (TFL) | Attending: Emergency Medicine | Admitting: Emergency Medicine

## 2022-11-22 DIAGNOSIS — R1033 Periumbilical pain: Secondary | ICD-10-CM | POA: Diagnosis not present

## 2022-11-22 DIAGNOSIS — I10 Essential (primary) hypertension: Secondary | ICD-10-CM | POA: Diagnosis not present

## 2022-11-22 DIAGNOSIS — Z7901 Long term (current) use of anticoagulants: Secondary | ICD-10-CM | POA: Insufficient documentation

## 2022-11-22 DIAGNOSIS — E119 Type 2 diabetes mellitus without complications: Secondary | ICD-10-CM | POA: Diagnosis not present

## 2022-11-22 DIAGNOSIS — Z7984 Long term (current) use of oral hypoglycemic drugs: Secondary | ICD-10-CM | POA: Insufficient documentation

## 2022-11-22 DIAGNOSIS — I509 Heart failure, unspecified: Secondary | ICD-10-CM | POA: Diagnosis not present

## 2022-11-22 DIAGNOSIS — K8020 Calculus of gallbladder without cholecystitis without obstruction: Secondary | ICD-10-CM | POA: Diagnosis not present

## 2022-11-22 DIAGNOSIS — T383X5A Adverse effect of insulin and oral hypoglycemic [antidiabetic] drugs, initial encounter: Secondary | ICD-10-CM | POA: Diagnosis not present

## 2022-11-22 DIAGNOSIS — Z79899 Other long term (current) drug therapy: Secondary | ICD-10-CM | POA: Insufficient documentation

## 2022-11-22 DIAGNOSIS — R109 Unspecified abdominal pain: Secondary | ICD-10-CM | POA: Diagnosis not present

## 2022-11-22 DIAGNOSIS — I700 Atherosclerosis of aorta: Secondary | ICD-10-CM | POA: Diagnosis not present

## 2022-11-22 DIAGNOSIS — T887XXA Unspecified adverse effect of drug or medicament, initial encounter: Secondary | ICD-10-CM

## 2022-11-22 LAB — CBC
HCT: 41 % (ref 36.0–46.0)
Hemoglobin: 13.5 g/dL (ref 12.0–15.0)
MCH: 30.4 pg (ref 26.0–34.0)
MCHC: 32.9 g/dL (ref 30.0–36.0)
MCV: 92.3 fL (ref 80.0–100.0)
Platelets: 200 10*3/uL (ref 150–400)
RBC: 4.44 MIL/uL (ref 3.87–5.11)
RDW: 15.5 % (ref 11.5–15.5)
WBC: 4.4 10*3/uL (ref 4.0–10.5)
nRBC: 0 % (ref 0.0–0.2)

## 2022-11-22 LAB — I-STAT CHEM 8, ED
BUN: 14 mg/dL (ref 8–23)
Calcium, Ion: 1.22 mmol/L (ref 1.15–1.40)
Chloride: 110 mmol/L (ref 98–111)
Creatinine, Ser: 1 mg/dL (ref 0.44–1.00)
Glucose, Bld: 114 mg/dL — ABNORMAL HIGH (ref 70–99)
HCT: 36 % (ref 36.0–46.0)
Hemoglobin: 12.2 g/dL (ref 12.0–15.0)
Potassium: 3.9 mmol/L (ref 3.5–5.1)
Sodium: 143 mmol/L (ref 135–145)
TCO2: 25 mmol/L (ref 22–32)

## 2022-11-22 LAB — COMPREHENSIVE METABOLIC PANEL
ALT: 17 U/L (ref 0–44)
AST: 22 U/L (ref 15–41)
Albumin: 3.8 g/dL (ref 3.5–5.0)
Alkaline Phosphatase: 47 U/L (ref 38–126)
Anion gap: 9 (ref 5–15)
BUN: 13 mg/dL (ref 8–23)
CO2: 22 mmol/L (ref 22–32)
Calcium: 9.6 mg/dL (ref 8.9–10.3)
Chloride: 112 mmol/L — ABNORMAL HIGH (ref 98–111)
Creatinine, Ser: 1.04 mg/dL — ABNORMAL HIGH (ref 0.44–1.00)
GFR, Estimated: 56 mL/min — ABNORMAL LOW (ref >60)
Glucose, Bld: 130 mg/dL — ABNORMAL HIGH (ref 70–99)
Potassium: 3.8 mmol/L (ref 3.5–5.1)
Sodium: 143 mmol/L (ref 135–145)
Total Bilirubin: 1.8 mg/dL — ABNORMAL HIGH (ref 0.3–1.2)
Total Protein: 6.6 g/dL (ref 6.5–8.1)

## 2022-11-22 LAB — TROPONIN I (HIGH SENSITIVITY)
Troponin I (High Sensitivity): 10 ng/L (ref <18)
Troponin I (High Sensitivity): 16 ng/L (ref <18)

## 2022-11-22 LAB — URINALYSIS, ROUTINE W REFLEX MICROSCOPIC
Bilirubin Urine: NEGATIVE
Glucose, UA: >=500 mg/dL — AB
Hgb urine dipstick: NEGATIVE
Ketones, ur: NEGATIVE mg/dL
Leukocytes,Ua: NEGATIVE
Nitrite: NEGATIVE
Protein, ur: 30 mg/dL — AB
Specific Gravity, Urine: 1.014 (ref 1.005–1.030)
pH: 5 (ref 5.0–8.0)

## 2022-11-22 LAB — LIPASE, BLOOD: Lipase: 27 U/L (ref 11–51)

## 2022-11-22 MED ORDER — ONDANSETRON HCL 4 MG/2ML IJ SOLN
4.0000 mg | Freq: Once | INTRAMUSCULAR | Status: DC
  Filled 2022-11-22: qty 2

## 2022-11-22 MED ORDER — FAMOTIDINE 20 MG PO TABS
20.0000 mg | ORAL_TABLET | Freq: Once | ORAL | Status: AC
  Administered 2022-11-22: 20 mg via ORAL
  Filled 2022-11-22: qty 1

## 2022-11-22 MED ORDER — LIDOCAINE VISCOUS HCL 2 % MT SOLN
15.0000 mL | Freq: Once | OROMUCOSAL | Status: AC
  Administered 2022-11-22: 15 mL via ORAL
  Filled 2022-11-22: qty 15

## 2022-11-22 MED ORDER — IOHEXOL 350 MG/ML SOLN
75.0000 mL | Freq: Once | INTRAVENOUS | Status: AC | PRN
  Administered 2022-11-22: 75 mL via INTRAVENOUS

## 2022-11-22 MED ORDER — ALUM & MAG HYDROXIDE-SIMETH 200-200-20 MG/5ML PO SUSP
30.0000 mL | Freq: Once | ORAL | Status: AC
  Administered 2022-11-22: 30 mL via ORAL
  Filled 2022-11-22: qty 30

## 2022-11-22 MED ORDER — FAMOTIDINE IN NACL 20-0.9 MG/50ML-% IV SOLN
20.0000 mg | Freq: Once | INTRAVENOUS | Status: DC
  Filled 2022-11-22: qty 50

## 2022-11-22 NOTE — Discharge Instructions (Addendum)
You were seen for your abdominal pain in the emergency department.   At home, please stop taking the Jardiance.    Check your MyChart online for the results of any tests that had not resulted by the time you left the emergency department.   Follow-up with your primary doctor in 2-3 days regarding your visit.    Return immediately to the emergency department if you experience any of the following: Worsening pain, vomiting, fevers, or any other concerning symptoms.    Thank you for visiting our Emergency Department. It was a pleasure taking care of you today.

## 2022-11-22 NOTE — ED Notes (Signed)
Attempted 12-lead EKG, clicked sent & store and print report, report will not print or send to the system. RN notified for assistance.

## 2022-11-22 NOTE — ED Provider Notes (Signed)
[place] EMERGENCY DEPARTMENT AT [facility] Provider Note   CSN: [id number] Arrival date & time: 11/22/22  1147     History  Chief Complaint  Patient presents with   Abdominal Pain    Renee Bradley is a 77 y.o. female.  77 year old female with a history of CHF, stroke, atrial fibrillation on Eliquis, diabetes on Jardiance, hypertension, and hyperlipidemia who presents to the emergency department with abdominal pain that started last night.  Says that it was a sensation similar to reflux but near her umbilicus.  Also complaining of nausea but no vomiting.  Has had several loose stools.  Denies any fevers.  Thinks that this could be related to her [place] that she took this morning.  Denies history of abdominal surgeries.  No dysuria or frequency.       Home Medications Prior to Admission medications   Medication Sig Start Date End Date Taking? Authorizing Provider  apixaban (ELIQUIS) 5 MG TABS tablet Take 1 tablet (5 mg total) by mouth 2 (two) times daily. 08/11/22   [name], PA-C  furosemide (LASIX) 20 MG tablet Take 1 tablet (20 mg total) by mouth daily as needed. 08/04/22   Runell Gess, MD  Lancets [facility] DELICA PLUS [place]) MISC  05/12/18   [provider]  metoprolol succinate (TOPROL-XL) 100 MG 24 hr tablet TAKE 1 TABLET BY MOUTH EVERY DAY 10/19/22   Sabharwal, Aditya, DO  ONETOUCH VERIO test strip  05/12/18   [provider]  potassium chloride SA (KLOR-CON M) 20 MEQ tablet Take 1 tablet (20 mEq total) by mouth daily. 09/22/22   Sabharwal, Aditya, DO  rosuvastatin (CRESTOR) 40 MG tablet TAKE 1 TABLET BY MOUTH EVERY DAY 10/27/22   Sabharwal, Aditya, DO  sacubitril-valsartan (ENTRESTO) 24-26 MG Take 1 tablet by mouth 2 (two) times daily. 08/11/22   [name], PA-C  spironolactone (ALDACTONE) 25 MG tablet Take 0.5 tablets (12.5 mg total) by mouth daily. 08/11/22   [name], PA-C      Allergies     Janumet [sitagliptin-metformin hcl], Januvia [sitagliptin], Kombiglyze [saxagliptin-metformin er], Sulfa antibiotics, and Elemental sulfur    Review of Systems   Review of Systems  Physical Exam Updated Vital Signs BP 118/87   Pulse (!) 53   Temp 97.9 F (36.6 C) (Oral)   Resp (!) 21   Ht  (1.575 m)   Wt 77.1 kg   SpO2 98%   BMI 31.09 kg/m  Physical Exam Vitals and nursing note reviewed.  Constitutional:      General: She is not in acute distress.    Appearance: She is well-developed.  HENT:     Head: Normocephalic and atraumatic.     Right Ear: External ear normal.     Left Ear: External ear normal.     Nose: Nose normal.  Eyes:     Extraocular Movements: Extraocular movements intact.     Conjunctiva/sclera: Conjunctivae normal.     Pupils: Pupils are equal, round, and reactive to light.  Pulmonary:     Effort: Pulmonary effort is normal. No respiratory distress.  Abdominal:     General: Abdomen is flat. There is no distension.     Palpations: Abdomen is soft. There is no mass.     Tenderness: There is no abdominal tenderness. There is no guarding.  Musculoskeletal:     Cervical back: Normal range of motion and neck supple.  Skin:    General: Skin is warm and  dry.  Neurological:     Mental Status: She is alert and oriented to person, place, and time. Mental status is at baseline.  Psychiatric:        Mood and Affect: Mood normal.     ED Results / Procedures / Treatments   Labs (all labs ordered are listed, but only abnormal results are displayed) Labs Reviewed  COMPREHENSIVE METABOLIC PANEL - Abnormal; Notable for the following components:      Result Value   Chloride 112 (*)    Glucose, Bld 130 (*)    Creatinine, Ser 1.04 (*)    Total Bilirubin 1.8 (*)    GFR, Estimated 56 (*)    All other components within normal limits  URINALYSIS, ROUTINE W REFLEX MICROSCOPIC - Abnormal; Notable for the following components:   APPearance CLOUDY (*)    Glucose,  UA >=500 (*)    Protein, ur 30 (*)    Bacteria, UA RARE (*)    All other components within normal limits  I-STAT CHEM 8, ED - Abnormal; Notable for the following components:   Glucose, Bld 114 (*)    All other components within normal limits  LIPASE, BLOOD  CBC  TROPONIN I (HIGH SENSITIVITY)  TROPONIN I (HIGH SENSITIVITY)    EKG EKG Interpretation  Date/Time:  Sunday November 22 2022 15:58:29 EDT Ventricular Rate:  53 PR Interval:  137 QRS Duration: 114 QT Interval:  443 QTC Calculation: 416 R Axis:   -67 Text Interpretation: Sinus rhythm Borderline IVCD with LAD Inferior infarct, old Probable anterior infarct, age indeterminate Confirmed by Vonita Moss [phone]) on 11/22/2022 5:22:17 PM  Radiology No results found.  Procedures Procedures   Medications Ordered in ED Medications  iohexol (OMNIPAQUE) 350 MG/ML injection 75 mL (75 mLs Intravenous Contrast Given 11/22/22 1657)  alum & mag hydroxide-simeth (MAALOX/MYLANTA) 200-200-20 MG/5ML suspension 30 mL (30 mLs Oral Given 11/22/22 1843)    And  lidocaine (XYLOCAINE) 2 % viscous mouth solution 15 mL (15 mLs Oral Given 11/22/22 1843)  famotidine (PEPCID) tablet 20 mg (20 mg Oral Given 11/22/22 2027)    ED Course/ Medical Decision Making/ A&P                             Medical Decision Making Amount and/or Complexity of Data Reviewed Labs: ordered. Radiology: ordered.  Risk OTC drugs. Prescription drug management.   Renee Bradley is a 77 y.o. female with comorbidities that complicate the patient evaluation including CHF, stroke, atrial fibrillation on Eliquis, diabetes on Jardiance, hypertension, and hyperlipidemia who presents to the emergency department with abdominal pain that started last night.   Initial Ddx:  Medication side effect, diverticulitis, mesenteric ischemia, appendicitis, MI  MDM:  Possible the patient could be having medication side effect from her Jardiance.  However, given her age will obtain  CT scan to evaluate for diverticulitis or appendicitis which could be causing her lower abdominal pain and diarrhea.  Did consider mesenteric ischemia with her history of atrial fibrillation and abdominal pain but symptoms appear very mild at this time.  With her risk factors for MI and reported sensation of reflux will also obtain EKG and troponin.  Plan:  Labs Lipase Urinalysis Troponin EKG CT abdomen pelvis with contrast  ED Summary/Re-evaluation:  Patient underwent the above workup that did not show any abnormalities.  Feel that her symptoms are likely due to her Jardiance.  Will have her discontinue this medication at this time  and follow-up with her primary doctor and cardiologist going forward.  Patient reports that her blood sugars have always been in acceptable range and that this was started for her cardiac conditions so do not feel that she needs to be started on another agent such as metformin or insulin at this time.  This patient presents to the ED for concern of complaints listed in HPI, this involves an extensive number of treatment options, and is a complaint that carries with it a high risk of complications and morbidity. Disposition including potential need for admission considered.   Dispo: DC Home. Return precautions discussed including, but not limited to, those listed in the AVS. Allowed pt time to ask questions which were answered fully prior to dc.  Additional history obtained from spouse Records reviewed Outpatient Clinic Notes The following labs were independently interpreted: Serial Troponins and show no acute abnormality I independently reviewed the following imaging with scope of interpretation limited to determining acute life threatening conditions related to emergency care: CT Abdomen/Pelvis and agree with the radiologist interpretation with the following exceptions: none I personally reviewed and interpreted cardiac monitoring: normal sinus rhythm  I  personally reviewed and interpreted the pt's EKG: see above for interpretation  I have reviewed the patients home medications and made adjustments as needed Social Determinants of health:  Elderly  Final Clinical Impression(s) / ED Diagnoses Final diagnoses:  Periumbilical abdominal pain  Side effect of medication    Rx / DC Orders ED Discharge Orders     None         Rondel Baton, MD 11/25/22 [phone]

## 2022-11-22 NOTE — ED Triage Notes (Signed)
Patient arrives ambulatory by POV c/o abdominal pain onset of last night. Reports feeling like she needed to throw up or have BM but neither occurred. Patient believes pain is related to Jardiance.

## 2022-11-23 ENCOUNTER — Telehealth: Payer: Self-pay

## 2022-11-23 NOTE — Transitions of Care (Post Inpatient/ED Visit) (Signed)
   11/23/2022  Name: Renee Bradley MRN: [id number] DOB: 1945/09/11  Today's TOC FU Call Status:    Transition Care Management Follow-up Telephone Call Date of Discharge: 11/22/22 Discharge Facility: [facility] Type of Discharge: Emergency Department Primary Inpatient Discharge Diagnosis:: Abdominal Pain Reason for ED Visit: Other: How have you been since you were released from the hospital?: Better Any questions or concerns?: Yes Patient Questions/Concerns:: patient reports she would like to discontinue the jardiance. Patient Questions/Concerns Addressed: Notified Provider of Patient Questions/Concerns  Items Reviewed: Did you receive and understand the discharge instructions provided?: Yes Medications obtained and verified?: Yes (Medications Reviewed) Any new allergies since your discharge?: No Dietary orders reviewed?: Yes Type of Diet Ordered:: n/a Do you have support at home?: Yes People in Home: spouse Name of Support/Comfort Primary Source: Garnette Scheuermann and Equipment/Supplies: Were Home Health Services Ordered?: No Any new equipment or medical supplies ordered?: No  Functional Questionnaire: Do you need assistance with bathing/showering or dressing?: No Do you need assistance with meal preparation?: No Do you need assistance with eating?: No Do you have difficulty maintaining continence: No Do you need assistance with getting out of bed/getting out of a chair/moving?: No Do you have difficulty managing or taking your medications?: No  Follow up appointments reviewed: PCP Follow-up appointment confirmed?: Yes Date of PCP follow-up appointment?: 11/25/22 Follow-up Provider: Dorothyann Peng Specialist [facility] Follow-up appointment confirmed?: No Reason Specialist Follow-Up Not Confirmed: Patient has Specialist Provider Number and will Call for Appointment Do you need transportation to your follow-up appointment?: No Do you understand care options if  your condition(s) worsen?: Yes-patient verbalized understanding    SIGNATURE Randa Lynn, CMA

## 2022-11-25 ENCOUNTER — Ambulatory Visit: Payer: Medicare Other | Admitting: Internal Medicine

## 2022-11-25 ENCOUNTER — Telehealth: Payer: Self-pay

## 2022-11-25 NOTE — Telephone Encounter (Signed)
     Patient  visit on 4/21  at [facility]   Have you been able to follow up with your primary care physician? Yes    The patient was or was not able to obtain any needed medicine or equipment. Yes   Are there diet recommendations that you are having difficulty following? Na   Patient expresses understanding of discharge instructions and education provided has no other needs at this time.  Yes     Lenard Forth [facility] Guide, [facility] [phone] [address] [place], [place], [place] [address] Phone: [phone] Email: [email or web address]

## 2022-11-26 ENCOUNTER — Ambulatory Visit: Payer: Medicare Other | Admitting: Internal Medicine

## 2022-11-30 ENCOUNTER — Encounter: Payer: Self-pay | Admitting: Internal Medicine

## 2022-11-30 ENCOUNTER — Ambulatory Visit (INDEPENDENT_AMBULATORY_CARE_PROVIDER_SITE_OTHER): Payer: Medicare Other | Admitting: Internal Medicine

## 2022-11-30 VITALS — BP 130/84 | HR 48 | Temp 98.2°F | Ht 62.0 in | Wt 172.6 lb

## 2022-11-30 DIAGNOSIS — E1122 Type 2 diabetes mellitus with diabetic chronic kidney disease: Secondary | ICD-10-CM

## 2022-11-30 DIAGNOSIS — N182 Chronic kidney disease, stage 2 (mild): Secondary | ICD-10-CM | POA: Diagnosis not present

## 2022-11-30 DIAGNOSIS — R1033 Periumbilical pain: Secondary | ICD-10-CM | POA: Diagnosis not present

## 2022-11-30 DIAGNOSIS — E6609 Other obesity due to excess calories: Secondary | ICD-10-CM | POA: Diagnosis not present

## 2022-11-30 DIAGNOSIS — Z6831 Body mass index (BMI) 31.0-31.9, adult: Secondary | ICD-10-CM | POA: Diagnosis not present

## 2022-11-30 DIAGNOSIS — Z2821 Immunization not carried out because of patient refusal: Secondary | ICD-10-CM | POA: Diagnosis not present

## 2022-11-30 DIAGNOSIS — R109 Unspecified abdominal pain: Secondary | ICD-10-CM

## 2022-11-30 NOTE — Progress Notes (Signed)
I,Renee Bradley,acting as a scribe for Renee Aliment, MD.,have documented all relevant documentation on the behalf of Renee Aliment, MD,as directed by  Renee Aliment, MD while in the presence of Renee Aliment, MD.    Subjective:     Patient ID: Renee Bradley , female    DOB: 06-13-1946 , 77 y.o.   MRN: [id number]   Chief Complaint  Patient presents with   Follow-up    HPI  Patient presents today for ED follow up. She visited [facility] on 4/21 for abdominal pain. She thinks it is due to the [place]. ER workup did not reveal any acute abdominal abnormalities. She has not had any further symptoms since stopping the medication on 4/21.   She adds that she no longer wants to continue Jardiance.      Past Medical History:  Diagnosis Date   Asthma    Chest pressure 07/13/2012   Diabetes mellitus without complication (HCC)    DM (diabetes mellitus) (HCC) 07/13/2012   HTN (hypertension) 07/13/2012   Hypercholesterolemia    Hypertension    SOB (shortness of breath) 07/13/2012   Tachycardia, unspecified, "fluttering" 07/13/2012     Family History  Problem Relation Age of Onset   Heart attack Mother    Diabetes type II Mother    Stroke Mother    Alzheimer's disease Father    Diabetes type II Brother      Current Outpatient Medications:    apixaban (ELIQUIS) 5 MG TABS tablet, Take 1 tablet (5 mg total) by mouth 2 (two) times daily., Disp: 60 tablet, Rfl: 3   furosemide (LASIX) 20 MG tablet, Take 1 tablet (20 mg total) by mouth daily as needed., Disp: 90 tablet, Rfl: 3   Lancets (ONETOUCH DELICA PLUS LANCET33G) MISC, , Disp: , Rfl:    metoprolol succinate (TOPROL-XL) 100 MG 24 hr tablet, TAKE 1 TABLET BY MOUTH EVERY DAY, Disp: 30 tablet, Rfl: 6   ONETOUCH VERIO test strip, , Disp: , Rfl:    potassium chloride SA (KLOR-CON M) 20 MEQ tablet, Take 1 tablet (20 mEq total) by mouth daily., Disp: 90 tablet, Rfl: 3   rosuvastatin (CRESTOR) 40 MG tablet, TAKE  1 TABLET BY MOUTH EVERY DAY, Disp: 90 tablet, Rfl: 2   sacubitril-valsartan (ENTRESTO) 24-26 MG, Take 1 tablet by mouth 2 (two) times daily., Disp: 180 tablet, Rfl: 3   spironolactone (ALDACTONE) 25 MG tablet, Take 0.5 tablets (12.5 mg total) by mouth daily., Disp: 45 tablet, Rfl: 3   Allergies  Allergen Reactions   Janumet [Sitagliptin-Metformin Hcl] Shortness Of Breath   Januvia [Sitagliptin] Shortness Of Breath   Kombiglyze [Saxagliptin-Metformin Er] Shortness Of Breath   Sulfa Antibiotics Shortness Of Breath   Elemental Sulfur Itching     Review of Systems  Constitutional: Negative.   Respiratory: Negative.    Cardiovascular: Negative.   Gastrointestinal: Negative.   Musculoskeletal: Negative.   Skin: Negative.   Neurological: Negative.   Psychiatric/Behavioral: Negative.       Today's Vitals   11/30/22 1422  BP: 130/84  Pulse: (!) 48  Temp: 98.2 F (36.8 C)  SpO2: 98%  Weight: 172 lb 9.6 oz (78.3 kg)  Height: 5\' 2"  (1.575 m)   Body mass index is 31.57 kg/m.  Wt Readings from Last 3 Encounters:  11/30/22 172 lb 9.6 oz (78.3 kg)  11/22/22 170 lb (77.1 kg)  10/26/22 170 lb (77.1 kg)    Objective:  Physical Exam Vitals and nursing note  reviewed.  Constitutional:      Appearance: Normal appearance. She is obese.  HENT:     Head: Normocephalic and atraumatic.     Nose:     Comments: Masked     Mouth/Throat:     Comments: Masked  Eyes:     Extraocular Movements: Extraocular movements intact.  Cardiovascular:     Rate and Rhythm: Normal rate and regular rhythm.     Heart sounds: Normal heart sounds.  Pulmonary:     Effort: Pulmonary effort is normal.     Breath sounds: Normal breath sounds.  Musculoskeletal:     Cervical back: Normal range of motion.  Skin:    General: Skin is warm.  Neurological:     General: No focal deficit present.     Mental Status: She is alert.  Psychiatric:        Mood and Affect: Mood normal.        Behavior: Behavior normal.          Assessment And Plan:     1. Periumbilical abdominal pain Comments: ER records reviewed, CT abd/pelvis w/o acute process and POS for cholelithiasis. Her sx have resolved since stopping Jardiance.  2. Type 2 diabetes mellitus with stage 2 chronic kidney disease, without long-term current use of insulin (HCC) Comments: She does not wish to try Farxiga at this time. Cardiac/renal benefits were d/w pt. She will rto in June 2024 for her next diabetes eval.  3. Class 1 obesity due to excess calories with serious comorbidity and body mass index (BMI) of 31.0 to 31.9 in adult Comments: She is encouraged to strive for BMI less than 30 to decrease cardiac risk. Advised to aim for at least 150 minutes of exercise per week.  4. Herpes zoster vaccination declined  Patient was given opportunity to ask questions. Patient verbalized understanding of the plan and was able to repeat key elements of the plan. All questions were answered to their satisfaction.   I, Renee Aliment, MD, have reviewed all documentation for this visit. The documentation on 11/30/22 for the exam, diagnosis, procedures, and orders are all accurate and complete.   IF YOU HAVE BEEN REFERRED TO A SPECIALIST, IT MAY TAKE 1-2 WEEKS TO SCHEDULE/PROCESS THE REFERRAL. IF YOU HAVE NOT HEARD FROM US/SPECIALIST IN TWO WEEKS, PLEASE GIVE [place] A CALL AT [phone].   THE PATIENT IS ENCOURAGED TO PRACTICE SOCIAL DISTANCING DUE TO THE COVID-19 PANDEMIC.

## 2022-11-30 NOTE — Patient Instructions (Signed)

## 2022-12-17 ENCOUNTER — Ambulatory Visit: Payer: Medicare Other | Admitting: Internal Medicine

## 2022-12-21 ENCOUNTER — Other Ambulatory Visit: Payer: Self-pay | Admitting: Internal Medicine

## 2022-12-21 DIAGNOSIS — I130 Hypertensive heart and chronic kidney disease with heart failure and stage 1 through stage 4 chronic kidney disease, or unspecified chronic kidney disease: Secondary | ICD-10-CM

## 2022-12-21 DIAGNOSIS — E1122 Type 2 diabetes mellitus with diabetic chronic kidney disease: Secondary | ICD-10-CM

## 2022-12-21 DIAGNOSIS — I5020 Unspecified systolic (congestive) heart failure: Secondary | ICD-10-CM

## 2022-12-21 DIAGNOSIS — E6609 Other obesity due to excess calories: Secondary | ICD-10-CM

## 2022-12-25 ENCOUNTER — Telehealth: Payer: Self-pay

## 2022-12-25 NOTE — Progress Notes (Signed)
Care Management & Coordination Services Pharmacy Team  Reason for Encounter: Chart review   Contacted patient to confirm in office appointment with Cherylin Mylar, PharmD on 12-30-2022 at 2:30. {US [facility] Outreach:[id number]} 12-25-2022: 1st attempt LVM Do you have any problems getting your medications? {yes/no:20286} If yes what types of problems are you experiencing? {Problems:27223}  What is your top health concern you would like to discuss at your upcoming visit?   Have you seen any other providers since your last visit with PCP? {yes/no:20286}   Chart review: Recent office visits:  11-30-2022 Dorothyann Peng, MD (PCP). Follow up visit for Periumbilical abdominal pain. No changes.  10-26-2022 Dorothyann Peng, MD (PCP). Follow up visit for DM and HTN. No changes follow up in 3 months.  09-04-2022 Little, [name], RN [facility]).  08-24-2022 Dorothyann Peng, MD (PCP). Follow up visit for DM and HTN. Patient reported not taking Vitamin D2.  08-06-2022 Dorothyann Peng, MD (PCP). Hospital follow up. No changes.   Recent consult visits:  10-13-2022 [name], DO (Cardiology). Follow up visit for CHF. Start Jardiance 10 mg daily. Echocardiogram completed  09-28-2022 [name], NP (Neurology). Visit for stroke follow up. No changes.   09-23-2022 [name], OT (Occupational therapy). [place] for Hemiplegia and hemiparesis following cerebral infarction affecting left non-dominant side.  09-22-2022 [name], DO (Cardiology). Follow up visit for CHF. Start metoprolol 100 mg daily.  09-21-2022  [name], OT (Occupational therapy). [place] for Hemiplegia and hemiparesis following cerebral infarction affecting left non-dominant side.  09-17-2022  [name], OT (Occupational therapy). [place] for Hemiplegia and hemiparesis following cerebral infarction affecting left non-dominant side.  09-15-2022  [name], OT (Occupational therapy). [place] for Hemiplegia and  hemiparesis following cerebral infarction affecting left non-dominant side.  09-10-2022  [name], OT (Occupational therapy). [place] for Hemiplegia and hemiparesis following cerebral infarction affecting left non-dominant side.  09-08-2022  [name], OT (Occupational therapy). [place] for Hemiplegia and hemiparesis following cerebral infarction affecting left non-dominant side.  09-02-2022  [name], DO (Cardiology). Cardioversion procedure completed.  09-01-2022 [name], OT (Occupational therapy). [place] for Hemiplegia and hemiparesis following cerebral infarction affecting left non-dominant side.  08-27-2022 [name], OT (Occupational therapy). [place] for Hemiplegia and hemiparesis following cerebral infarction affecting left non-dominant side.  08-20-2022 [name], OT (Occupational therapy). [place] for Hemiplegia and hemiparesis following cerebral infarction affecting left non-dominant side.  08-12-2022 [name], OT (Occupational therapy). [place] for Hemiplegia and hemiparesis following cerebral infarction affecting left non-dominant side.  08-11-2022 [name], PA-C (Cardiology). Visit for HFrEF. Referral placed to neurology.   Hospital visits:  Medication Reconciliation was completed by comparing discharge summary, patient's EMR and Pharmacy list, and upon discussion with patient.  Admitted to the hospital on 11-22-2022 due to Periumbilical abdominal pain . Discharge date was 11-22-2022. Discharged from [facility].    New?Medications Started at [facility] Discharge:?? None  Medication Changes at Hospital Discharge: None  Medications Discontinued at Hospital Discharge: Jardiance  Medications that remain the same after Hospital Discharge:??  -All other medications will remain the same.    Hospital visits:  Medication Reconciliation was completed by comparing discharge summary, patient's EMR and Pharmacy list,  and upon discussion with patient.  Admitted to the hospital on 07-27-2022 due to Atrial fibrillation with RVR (HCC). Discharge date was 08-02-2022. Discharged from [facility].    New?Medications Started at [facility] Discharge:?? Eliquis 5 mg twice daily Digoxin 0.125 mg daily Metoprolol  100 mg twice daily Macrobid 100 mg twice daily Entresto 24-26 mg 1 tablet twice daily Spironolactone 12.5 mg daily  Medication Changes at Hospital Discharge: None  Medications Discontinued at Hospital Discharge: Amlodipine Aspirin atenolol  Medications that remain the same after Hospital Discharge:??  -All other medications will remain the same.    Star Rating Drugs:  Rosuvastatin 40 mg- Last filled 10-09-2022 90 DS [facility]. Previous 08-02-2022 90 DS. Entresto 24-26 mg- Last filled 11-04-2022 90 DS [facility]. Previous 08-25-2022 90 DS.  Care Gaps: Annual wellness visit in last year? Yes Foot exam overdue Covid booster overdue  If Diabetic: Last eye exam / retinopathy screening: 05-01-2022 Last diabetic foot exam:   Huey Romans [facility] Clinical Pharmacist Assistant [phone]

## 2022-12-30 ENCOUNTER — Ambulatory Visit: Payer: Medicare Other

## 2022-12-30 MED ORDER — FARXIGA 10 MG PO TABS: 10.0000 mg | ORAL_TABLET | Freq: Every day | ORAL | 0 refills | Status: DC

## 2022-12-30 NOTE — Progress Notes (Signed)
Care Management & Coordination Services Pharmacy Note  12/30/2022 Name:  Renee Bradley MRN:  [id number] DOB:  Nov 13, 1945  Summary: Ms. Reith is going to start taking Farxiga 10 mg tablet three days per week on Mondays, Wednesdays and Fridays for the next thirty days.   Recommendations/Changes made from today's visit: Recommend restarting Jardiance or Farxiga once per day.  Recommend applying for Entresto patient assistance program   Follow up plan: Patient to have [facility] call once per day to review how she is feeling taking Farxiga 10 mg tablet daily.   Patient to take [place] three days per week on Monday, Wednesday and Friday She is going to start checking her BP daily after she does her daily weight in the morning  Complete patient assistance application for Entresto 24-26 mg tablet twice per day   Subjective: Renee Bradley is an 77 y.o. year old female who is a primary patient of Dorothyann Peng, MD.  The care coordination team was consulted for assistance with disease management and care coordination needs.    Engaged with patient face to face for initial visit. 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 Both are retired they have two kids,. Daughter 57 and son 80 and two grands, [name] is 30 year olds, and [name] 23 in July and great grands, [name] 10 and John 2. She has breakfast, she is on the iPAD all day looking at things, talking to friends, cleaning here and there. She retired from [employer] in 2007. She was apart of the colostomy bags. At this time they dont know  why they are here.   Recent office visits:  11-30-2022 Dorothyann Peng, MD (PCP). Follow up visit for Periumbilical abdominal pain. No changes.   10-26-2022 Dorothyann Peng, MD (PCP). Follow up visit for DM and HTN. No changes follow up in 3 months.   09-04-2022 Little, [name], RN [facility]).   08-24-2022 Dorothyann Peng, MD (PCP). Follow up visit for DM and  HTN. Patient reported not taking Vitamin D2.   08-06-2022 Dorothyann Peng, MD (PCP). Hospital follow up. No changes.     Recent consult visits:  10-13-2022 [name], DO (Cardiology). Follow up visit for CHF. Start Jardiance 10 mg daily. Echocardiogram completed   09-28-2022 [name], NP (Neurology). Visit for stroke follow up. No changes.    09-23-2022 [name], OT (Occupational therapy). Thearpy for Hemiplegia and hemiparesis following cerebral infarction affecting left non-dominant side.   09-22-2022 [name], DO (Cardiology). Follow up visit for CHF. Start metoprolol 100 mg daily.   09-21-2022  [name], OT (Occupational therapy). Thearpy for Hemiplegia and hemiparesis following cerebral infarction affecting left non-dominant side.   09-17-2022  [name], OT (Occupational therapy). Thearpy for Hemiplegia and hemiparesis following cerebral infarction affecting left non-dominant side.   09-15-2022  [name], OT (Occupational therapy). Thearpy for Hemiplegia and hemiparesis following cerebral infarction affecting left non-dominant side.   09-10-2022  [name], OT (Occupational therapy). Thearpy for Hemiplegia and hemiparesis following cerebral infarction affecting left non-dominant side.   09-08-2022  [name], OT (Occupational therapy). Thearpy for Hemiplegia and hemiparesis following cerebral infarction affecting left non-dominant side.   09-02-2022  [name], DO (Cardiology). Cardioversion procedure completed.   09-01-2022 [name], OT (Occupational therapy). Thearpy for Hemiplegia and hemiparesis following cerebral infarction affecting left non-dominant side.   08-27-2022 [name], OT (Occupational therapy).  Thearpy for Hemiplegia and hemiparesis following cerebral infarction affecting left non-dominant side.   08-20-2022 [name], OT (Occupational therapy). Thearpy for Hemiplegia and hemiparesis  following cerebral infarction affecting left non-dominant side.   08-12-2022 [name], OT (Occupational therapy). Thearpy for Hemiplegia and hemiparesis following cerebral infarction affecting left non-dominant side.   08-11-2022 [name], PA-C (Cardiology). Visit for HFrEF. Referral placed to neurology.    Hospital visits:  Medication Reconciliation was completed by comparing discharge summary, patient's EMR and Pharmacy list, and upon discussion with patient.   Admitted to the hospital on 11-22-2022 due to Periumbilical abdominal pain . Discharge date was 11-22-2022. Discharged from [facility].     New?Medications Started at [facility] Discharge:?? None   Medication Changes at Hospital Discharge: None   Medications Discontinued at Hospital Discharge: Jardiance   Medications that remain the same after Hospital Discharge:??  -All other medications will remain the same.     Hospital visits:  Medication Reconciliation was completed by comparing discharge summary, patient's EMR and Pharmacy list, and upon discussion with patient.   Admitted to the hospital on 07-27-2022 due to Atrial fibrillation with RVR (HCC). Discharge date was 08-02-2022. Discharged from [facility].     New?Medications Started at [facility] Discharge:?? Eliquis 5 mg twice daily Digoxin 0.125 mg daily Metoprolol 100 mg twice daily Macrobid 100 mg twice daily Entresto 24-26 mg 1 tablet twice daily Spironolactone 12.5 mg daily   Medication Changes at Hospital Discharge: None   Medications Discontinued at Hospital Discharge: Amlodipine Aspirin atenolol   Medications that remain the same after Hospital Discharge:??  -All other medications will remain the same.     Objective:  Lab Results  Component Value Date   CREATININE 1.00 11/22/2022   BUN 14 11/22/2022   EGFR 34 (L) 08/06/2022   GFRNONAA 56 (L) 11/22/2022   GFRAA 75 08/13/2020   NA 143 11/22/2022   K 3.9  11/22/2022   CALCIUM 9.6 11/22/2022   CO2 22 11/22/2022   GLUCOSE 114 (H) 11/22/2022    Lab Results  Component Value Date/Time   HGBA1C 6.3 (H) 10/26/2022 05:21 PM   HGBA1C 6.5 (H) 07/29/2022 02:34 AM   MICROALBUR 30 03/13/2021 04:35 PM   MICROALBUR 30 02/07/2020 04:17 PM    Last diabetic Eye exam:  Lab Results  Component Value Date/Time   HMDIABEYEEXA No Retinopathy 03/24/2022 12:00 AM    Last diabetic Foot exam: No results found for: "HMDIABFOOTEX"   Lab Results  Component Value Date   CHOL 144 07/29/2022   HDL 55 07/29/2022   LDLCALC 78 07/29/2022   TRIG 55 07/29/2022   CHOLHDL 2.6 07/29/2022       Latest Ref Rng & Units 11/22/2022   11:56 AM 08/18/2022   12:38 PM 04/23/2022   11:22 AM  Hepatic Function  Total Protein 6.5 - 8.1 g/dL 6.6  6.5  6.3   Albumin 3.5 - 5.0 g/dL 3.8  4.0  4.5   AST 15 - 41 U/L 22  16  13    ALT 0 - 44 U/L 17  12  10    Alk Phosphatase 38 - 126 U/L 47  57  75   Total Bilirubin 0.3 - 1.2 mg/dL 1.8  1.9  1.4     Lab Results  Component Value Date/Time   TSH 1.115 07/30/2022 04:21 AM   TSH 1.380 04/23/2022 11:22 AM   TSH 0.867 02/02/2019 04:05 PM       Latest Ref Rng &  Units 11/22/2022    1:23 PM 11/22/2022   11:56 AM 09/02/2022    9:57 AM  CBC  WBC 4.0 - 10.5 K/uL  4.4    Hemoglobin 12.0 - 15.0 g/dL 16.1  09.6  04.5   Hematocrit 36.0 - 46.0 % 36.0  41.0  44.0   Platelets 150 - 400 K/uL  200      Lab Results  Component Value Date/Time   VITAMINB12 382 04/23/2022 11:22 AM    Clinical ASCVD: Yes  The ASCVD Risk score (Arnett DK, et al., 2019) failed to calculate for the following reasons:   The patient has a prior MI or stroke diagnosis        11/30/2022    2:23 PM 04/23/2022   10:18 AM 03/13/2021    3:57 PM  Depression screen PHQ 2/9  Decreased Interest 0 0 0  Down, Depressed, Hopeless 0 0 0  PHQ - 2 Score 0 0 0  Altered sleeping 0    Tired, decreased energy 0    Change in appetite 0    Feeling bad or failure about  yourself  0    Trouble concentrating 0    Moving slowly or fidgety/restless 0    Suicidal thoughts 0    PHQ-9 Score 0    Difficult doing work/chores Not difficult at all       Social History   Tobacco Use  Smoking Status Former   Years: 2   Types: Cigarettes   Quit date: 05/13/1983   Years since quitting: 39.6  Smokeless Tobacco Never   BP Readings from Last 3 Encounters:  11/30/22 130/84  11/22/22 118/87  10/26/22 120/70   Pulse Readings from Last 3 Encounters:  11/30/22 (!) 48  11/22/22 (!) 53  10/26/22 (!) 52   Wt Readings from Last 3 Encounters:  11/30/22 172 lb 9.6 oz (78.3 kg)  11/22/22 170 lb (77.1 kg)  10/26/22 170 lb (77.1 kg)   BMI Readings from Last 3 Encounters:  11/30/22 31.57 kg/m  11/22/22 31.09 kg/m  10/26/22 31.09 kg/m    Allergies  Allergen Reactions   Janumet [Sitagliptin-Metformin Hcl] Shortness Of Breath   Januvia [Sitagliptin] Shortness Of Breath   Kombiglyze [Saxagliptin-Metformin Er] Shortness Of Breath   Sulfa Antibiotics Shortness Of Breath   Elemental Sulfur Itching    Medications Reviewed Today     Reviewed by Dorothyann Peng, MD (Physician) on 11/30/22 at 1444  Med List Status: <None>   Medication Order Taking? Sig Documenting Provider Last Dose Status Informant  apixaban (ELIQUIS) 5 MG TABS tablet [id number] Yes Take 1 tablet (5 mg total) by mouth 2 (two) times daily. [name], PA-C Taking Active Self  furosemide (LASIX) 20 MG tablet [id number] Yes Take 1 tablet (20 mg total) by mouth daily as needed. Runell Gess, MD Taking Active Self  Lancets [facility] [place] PLUS [place]) MISC [id number] Yes  [provider] Taking Active Self  metoprolol succinate (TOPROL-XL) 100 MG 24 hr tablet [id number] Yes TAKE 1 TABLET BY MOUTH EVERY DAY Sabharwal, Aditya, DO Taking Active   [facility] VERIO test strip [id number] Yes  [provider] Taking Active Self  potassium chloride SA (KLOR-CON M) 20 MEQ tablet  [id number] Yes Take 1 tablet (20 mEq total) by mouth daily. [name], DO Taking Active   rosuvastatin (CRESTOR) 40 MG tablet [id number] Yes TAKE 1 TABLET BY MOUTH EVERY DAY Sabharwal, Aditya, DO Taking Active   sacubitril-valsartan (ENTRESTO) 24-26 MG [id number] Yes Take 1 tablet by  mouth 2 (two) times daily. [name], PA-C Taking Active Self  spironolactone (ALDACTONE) 25 MG tablet [id number] Yes Take 0.5 tablets (12.5 mg total) by mouth daily. [name], PA-C Taking Active Self            SDOH:  (Social Determinants of Health) assessments and interventions performed: Yes SDOH Interventions    Flowsheet Row ED to Hosp-Admission (Discharged) from 07/27/2022 in [facility] 3E HF PCU Clinical Support from 04/23/2022 in [facility] Clinical Support from 02/02/2019 in [facility]  SDOH Interventions     Food Insecurity Interventions -- Intervention Not Indicated --  Housing Interventions Intervention Not Indicated -- --  Transportation Interventions -- Intervention Not Indicated --  Alcohol Usage Interventions Intervention Not Indicated (Score <7) -- --  Depression Interventions/Treatment  -- -- NWG9-5 Score <4 Follow-up Not Indicated  Financial Strain Interventions Intervention Not Indicated Intervention Not Indicated --  Physical Activity Interventions -- Patient Refused, Other (Comments) --  Stress Interventions -- Intervention Not Indicated --       Medication Assistance: Application for Entresto  medication assistance program. in process.  Anticipated assistance start date 01/2023.  See plan of care for additional detail.  Medication Access: Name and location of current pharmacy:  [facility] - [place], [place] - [address] [facility] AT Portal to Registered [facility] Sites [address] [address] Phone: [phone] Fax:  [phone]  [id number] [place], [place] - [address] AT [facility] [address] [place] [address] Phone: [phone] Fax: [phone]  [facility] [address] [place] [address] Phone: [phone] Fax: [phone]  [id number] [place], [place] - [address] AT CORNER OF [facility] [address] [place] [address] Phone: [phone] Fax: [phone]  Within the past 30 days, how often has patient missed a dose of medication? No Is a pillbox or other method used to improve adherence? Yes  Factors that may affect medication adherence? nonadherence to medications and lack of understanding of disease management Are meds synced by current pharmacy? No  Are meds delivered by current pharmacy? Yes  Does patient experience delays in picking up medications due to transportation concerns? Yes   Compliance/Adherence/Medication fill history: Care Gaps: Foot exam overdue Covid booster overdue  Star-Rating Drugs: Rosuvastatin 40 mg- [facility]09-[facility]22 [facility] Previous 08-02-2022 90 DS. Entresto 24-26 mg- Last filled 11-04-2022 [facility] Previous 08-25-2022 90 DS.   Assessment/Plan   Heart Failure (Goal: manage symptoms and prevent exacerbations) -Controlled -HF type: HFrEF (EF < 40%) -NYHA Class: III (marked limitation of activity) -AHA HF Stage: B (Heart disease present - no symptoms present) -Current treatment: Furosemide 20 mg tablet once per day  Entresto 24-26 mg one tablet twice per day  Spironolacton 25 mg tablet once per day  -Medications previously tried: [place]  -Current home BP/HR readings: she is not checking her BP or heart rate for her  -Current home daily weights: she is weighing herself every morning, 169 lbs is usually her weight -Current dietary habits: she ate [employer] frozen vegetables, vegetable medley and  fish sticks, pinto beans -Current exercise habits: will discuss during next visit  -Educated on Importance of weighing daily; if you gain more than 3 pounds in one day or 5 pounds in one week, to take lasix an call cardiology team and  or PCP team -Recommended to continue current medication Collaborated with patient to restart SGLT-2. Patient to start Farxiga 10 mg tablet on Monday, Wednesday, and Friday per suggestions of PCP.    Cherylin Mylar, CPP, PharmD Clinical Pharmacist Practitioner [facility] [phone]

## 2023-01-04 ENCOUNTER — Telehealth: Payer: Self-pay

## 2023-01-04 NOTE — Progress Notes (Signed)
01-04-2023: Contacted patient to see how she is reacting to [place]. Patient stated she hasn't started farxiga yet. Her first dose will be tonight. Told patient I would reach back out next Monday to follow up. Task assigned to call patient next Monday.  Huey Romans [facility] Clinical Pharmacist Assistant [phone]

## 2023-01-20 ENCOUNTER — Other Ambulatory Visit: Payer: Self-pay | Admitting: Pharmacist

## 2023-01-20 NOTE — Progress Notes (Signed)
Care Coordination  Contacted patient regarding upcoming appointment with [facility] pharmacist. Patient denies any medication-related concerns at this time. Patient instructed to reach back out to primary care provider with future medication needs.     Notes she is doing well taking Farxiga 10 mg three days weekly.   Catie Eppie Gibson, PharmD, BCACP, CPP Clinical Pharmacist [facility] [phone]

## 2023-01-28 ENCOUNTER — Encounter: Payer: Self-pay | Admitting: Internal Medicine

## 2023-01-28 ENCOUNTER — Ambulatory Visit (INDEPENDENT_AMBULATORY_CARE_PROVIDER_SITE_OTHER): Payer: Medicare Other | Admitting: Internal Medicine

## 2023-01-28 VITALS — BP 118/82 | HR 45 | Temp 98.0°F | Ht 62.0 in | Wt 167.2 lb

## 2023-01-28 DIAGNOSIS — I130 Hypertensive heart and chronic kidney disease with heart failure and stage 1 through stage 4 chronic kidney disease, or unspecified chronic kidney disease: Secondary | ICD-10-CM | POA: Diagnosis not present

## 2023-01-28 DIAGNOSIS — E1122 Type 2 diabetes mellitus with diabetic chronic kidney disease: Secondary | ICD-10-CM | POA: Diagnosis not present

## 2023-01-28 DIAGNOSIS — L659 Nonscarring hair loss, unspecified: Secondary | ICD-10-CM | POA: Diagnosis not present

## 2023-01-28 DIAGNOSIS — N182 Chronic kidney disease, stage 2 (mild): Secondary | ICD-10-CM

## 2023-01-28 DIAGNOSIS — I4891 Unspecified atrial fibrillation: Secondary | ICD-10-CM

## 2023-01-28 DIAGNOSIS — E6609 Other obesity due to excess calories: Secondary | ICD-10-CM

## 2023-01-28 DIAGNOSIS — I5020 Unspecified systolic (congestive) heart failure: Secondary | ICD-10-CM | POA: Diagnosis not present

## 2023-01-28 DIAGNOSIS — Z6830 Body mass index (BMI) 30.0-30.9, adult: Secondary | ICD-10-CM | POA: Diagnosis not present

## 2023-01-28 NOTE — Progress Notes (Signed)
I,Renee Bradley, CMA,acting as a Neurosurgeon for Renee Aliment, MD.,have documented all relevant documentation on the behalf of Renee Aliment, MD,as directed by  Renee Aliment, MD while in the presence of Renee Aliment, MD.  Subjective:  Patient ID: Renee Bradley , female    DOB: [birth year] , 77 y.o.   MRN: [id number]  Chief Complaint  Patient presents with   Diabetes   Hypertension    HPI  She presents today for diabetes and htn check. She reports compliance with meds.  She denies chest pain and shortness of breath. She is now taking Farxiga Mondays, Wednesdays and Fridays. She did not tolerate daily dosing. She feels well with this current regimen.      Diabetes She presents for her follow-up diabetic visit. She has type 2 diabetes mellitus. Her disease course has been stable. There are no hypoglycemic associated symptoms. There are no diabetic associated symptoms. Pertinent negatives for diabetes include no blurred vision, no chest pain, no polydipsia, no polyphagia and no polyuria. There are no hypoglycemic complications. Diabetic complications include nephropathy. Risk factors for coronary artery disease include diabetes mellitus, dyslipidemia, hypertension, post-menopausal, sedentary lifestyle and obesity. She is compliant with treatment some of the time. Her weight is fluctuating minimally. She is following a diabetic diet. She participates in exercise intermittently. An ACE inhibitor/angiotensin II receptor blocker is not being taken.  Hypertension This is a chronic problem. The current episode started more than 1 year ago. The problem has been gradually improving since onset. The problem is uncontrolled. Pertinent negatives include no blurred vision or chest pain. The current treatment provides moderate improvement. Hypertensive end-organ damage includes kidney disease.     Past Medical History:  Diagnosis Date   Asthma    Chest pressure 07/13/2012   Diabetes mellitus  without complication (HCC)    DM (diabetes mellitus) (HCC) 07/13/2012   HTN (hypertension) 07/13/2012   Hypercholesterolemia    Hypertension    SOB (shortness of breath) 07/13/2012   Tachycardia, unspecified, "fluttering" 07/13/2012     Family History  Problem Relation Age of Onset   Heart attack Mother    Diabetes type II Mother    Stroke Mother    Alzheimer's disease Father    Diabetes type II Brother      Current Outpatient Medications:    apixaban (ELIQUIS) 5 MG TABS tablet, Take 1 tablet (5 mg total) by mouth 2 (two) times daily., Disp: 60 tablet, Rfl: 3   furosemide (LASIX) 20 MG tablet, Take 1 tablet (20 mg total) by mouth daily as needed., Disp: 90 tablet, Rfl: 3   Lancets (ONETOUCH DELICA PLUS LANCET33G) MISC, , Disp: , Rfl:    metoprolol succinate (TOPROL-XL) 100 MG 24 hr tablet, TAKE 1 TABLET BY MOUTH EVERY DAY, Disp: 30 tablet, Rfl: 6   ONETOUCH VERIO test strip, , Disp: , Rfl:    potassium chloride SA (KLOR-CON M) 20 MEQ tablet, Take 1 tablet (20 mEq total) by mouth daily., Disp: 90 tablet, Rfl: 3   rosuvastatin (CRESTOR) 40 MG tablet, TAKE 1 TABLET BY MOUTH EVERY DAY, Disp: 90 tablet, Rfl: 2   sacubitril-valsartan (ENTRESTO) 24-26 MG, Take 1 tablet by mouth 2 (two) times daily., Disp: 180 tablet, Rfl: 3   spironolactone (ALDACTONE) 25 MG tablet, Take 0.5 tablets (12.5 mg total) by mouth daily., Disp: 45 tablet, Rfl: 3   FARXIGA 10 MG TABS tablet, Take one tab po MWF, Disp: 45 tablet, Rfl: 0   Allergies  Allergen  Reactions   Janumet [Sitagliptin-Metformin Hcl] Shortness Of Breath   Januvia [Sitagliptin] Shortness Of Breath   Kombiglyze [Saxagliptin-Metformin Er] Shortness Of Breath   Sulfa Antibiotics Shortness Of Breath   Elemental Sulfur Itching     Review of Systems  Constitutional: Negative.   Eyes:  Negative for blurred vision.  Respiratory: Negative.    Cardiovascular: Negative.  Negative for chest pain.  Gastrointestinal: Negative.   Endocrine:  Negative for polydipsia, polyphagia and polyuria.  Musculoskeletal: Negative.   Skin:        She c/o hair loss. States her beautician is concerned. She thinks it is due to one of her new medications she has taken in the past few months. Denies excess scaling in her scalp and no excess itchiness.   Psychiatric/Behavioral: Negative.       Today's Vitals   01/28/23 1607  BP: 118/82  Pulse: (!) 45  Temp: 98 F (36.7 C)  SpO2: 98%  Weight: 167 lb 3.2 oz (75.8 kg)  Height: 5\' 2"  (1.575 m)   Body mass index is 30.58 kg/m.  Wt Readings from Last 3 Encounters:  01/28/23 167 lb 3.2 oz (75.8 kg)  11/30/22 172 lb 9.6 oz (78.3 kg)  11/22/22 170 lb (77.1 kg)     Objective:  Physical Exam Vitals and nursing note reviewed.  Constitutional:      Appearance: Normal appearance. She is obese.  HENT:     Head: Normocephalic and atraumatic.  Eyes:     Extraocular Movements: Extraocular movements intact.  Cardiovascular:     Rate and Rhythm: Bradycardia present. Rhythm irregular.     Heart sounds: Normal heart sounds.  Pulmonary:     Effort: Pulmonary effort is normal.     Breath sounds: Normal breath sounds.  Musculoskeletal:     Cervical back: Normal range of motion.  Skin:    General: Skin is warm.  Neurological:     General: No focal deficit present.     Mental Status: She is alert.  Psychiatric:        Mood and Affect: Mood normal.        Behavior: Behavior normal.         Assessment And Plan:  Type 2 diabetes mellitus with stage 2 chronic kidney disease, without long-term current use of insulin (HCC) Assessment & Plan: Chronic, she will continue with [place] with MWF dosing.  She is encouraged to follow current dietary recommendations. She will follow up in 3-4 months for re-evaluation.   Orders: -     BMP8+eGFR -     Hemoglobin A1c  Hypertensive heart and renal disease with renal failure, stage 1 through stage 4 or unspecified chronic kidney disease, with heart failure  (HCC) Assessment & Plan: Chronic, well controlled. She is encouraged to follow low sodium diet.    HFrEF (heart failure with reduced ejection fraction) (HCC) Assessment & Plan: Chronic, appears to be euvolemic at this time. She will continue with metoprolol XL 100mg , spironolactone 25mg , furosemide 20mg  and Entresto 24/26mg  as per Cardiology.    Atrial fibrillation, unspecified type [facility]) Assessment & Plan: Chronic, rate controlled on Bblocker and adequately anticoagulated with Eliquis. Heart rate in 40s, may need decrease in metoprolol XL. Defer med management to Cardiology.    Hair loss Assessment & Plan: I will check iron levels. Importance of adequate nutrition to help with hair growth was discussed with the patient. She declines Derm referral at this time.   Orders: -     Iron, TIBC  and Ferritin Panel  Class 1 obesity due to excess calories with serious comorbidity and body mass index (BMI) of 30.0 to 30.9 in adult Assessment & Plan: She is advised to aim for at least 150 minutes of exercise per week.    Other orders -     Marcelline Deist; Take one tab po MWF  Dispense: 45 tablet; Refill: 0  She is encouraged to strive for BMI less than 30 to decrease cardiac risk. Advised to aim for at least 150 minutes of exercise per week.    Return if symptoms worsen or fail to improve.  Patient was given opportunity to ask questions. Patient verbalized understanding of the plan and was able to repeat key elements of the plan. All questions were answered to their satisfaction.    I, Renee Aliment, MD, have reviewed all documentation for this visit. The documentation on 01/28/23 for the exam, diagnosis, procedures, and orders are all accurate and complete.   IF YOU HAVE BEEN REFERRED TO A SPECIALIST, IT MAY TAKE 1-2 WEEKS TO SCHEDULE/PROCESS THE REFERRAL. IF YOU HAVE NOT HEARD FROM US/SPECIALIST IN TWO WEEKS, PLEASE GIVE [place] A CALL AT [phone].   THE PATIENT IS ENCOURAGED TO  PRACTICE SOCIAL DISTANCING DUE TO THE COVID-19 PANDEMIC.

## 2023-01-28 NOTE — Patient Instructions (Addendum)
Nutrafol  Type 2 Diabetes Mellitus, Diagnosis, Adult Type 2 diabetes (type 2 diabetes mellitus) is a long-term (chronic) disease. It may happen when there is one or both of these problems: The pancreas does not make enough insulin. The body does not react in a normal way to insulin that it makes. Insulin lets sugars go into cells in your body. If you have type 2 diabetes, sugars cannot get into your cells. Sugars build up in the blood. This causes high blood sugar. What are the causes? The exact cause of this condition is not known. What increases the risk? Having type 2 diabetes in your family. Being overweight or very overweight. Not being active. Your body not reacting in a normal way to the insulin it makes. Having higher than normal blood sugar over time. Having a type of diabetes when you were pregnant. Having a condition that causes small fluid-filled sacs on your ovaries. What are the signs or symptoms? At first, you may have no symptoms. You will get symptoms slowly. They may include: More thirst than normal. More hunger than normal. Needing to pee more than normal. Losing weight without trying. Feeling tired. Feeling weak. Seeing things blurry. Dark patches on your skin. How is this treated? This condition may be treated by a diabetes expert. You may need to: Follow an eating plan made by a food expert (dietitian). Get regular exercise. Find ways to deal with stress. Check blood sugar as often as told. Take medicines. Your doctor will set treatment goals for you. Your blood sugar should be at these levels: Before meals: 80-130 mg/dL (0.9-8.1 mmol/L). After meals: below 180 mg/dL (10 mmol/L). Over the last 2-3 months: less than 7%. Follow these instructions at home: Medicines Take your diabetes medicines or insulin every day. Take medicines as told to help you prevent other problems caused by this condition. You may need: Aspirin. Medicine to lower  cholesterol. Medicine to control blood pressure. Questions to ask your doctor Should I meet with a diabetes educator? What medicines do I need, and when should I take them? What will I need to treat my condition at home? When should I check my blood sugar? Where can I find a support group? Who can I call if I have questions? When is my next doctor visit? General instructions Take over-the-counter and prescription medicines only as told by your doctor. Keep all follow-up visits. Where to find more information For help and guidance and more information about diabetes, please go to: American Diabetes Association (ADA): www.diabetes.org American Association of Diabetes Care and Education Specialists (ADCES): www.diabeteseducator.org International Diabetes Federation (IDF): DCOnly.dk Contact a doctor if: Your blood sugar is at or above 240 mg/dL (19.1 mmol/L) for 2 days in a row. You have been sick for 2 days or more, and you are not getting better. You have had a fever for 2 days or more, and you are not getting better. You have any of these problems for more than 6 hours: You cannot eat or drink. You feel like you may vomit. You vomit. You have watery poop (diarrhea). Get help right away if: Your blood sugar is lower than 54 mg/dL (3 mmol/L). You feel mixed up (confused). You have trouble thinking clearly. You have trouble breathing. You have medium or large ketone levels in your pee. These symptoms may be an emergency. Get help right away. Call your local emergency services (911 in the [place] Do not wait to see if the symptoms will go away. Do not  drive yourself to the hospital. Summary Type 2 diabetes is a long-term disease. Your pancreas may not make enough insulin, or your body may not react in a normal way to insulin that it makes. This condition is treated with an eating plan, lifestyle changes, and medicines. Your doctor will set treatment goals for you. These will help  you keep your blood sugar in a healthy range. Keep all follow-up visits. This information is not intended to replace advice given to you by your health care provider. Make sure you discuss any questions you have with your health care provider. Document Revised: 10/14/2020 Document Reviewed: 10/14/2020 Elsevier Patient Education  2024 [employer].

## 2023-01-29 LAB — IRON,TIBC AND FERRITIN PANEL
Ferritin: 154 ng/mL — ABNORMAL HIGH (ref 15–150)
Iron Saturation: 25 % (ref 15–55)
Iron: 72 ug/dL (ref 27–139)
Total Iron Binding Capacity: 286 ug/dL (ref 250–450)
UIBC: 214 ug/dL (ref 118–369)

## 2023-01-29 LAB — HEMOGLOBIN A1C
Est. average glucose Bld gHb Est-mCnc: 123 mg/dL
Hgb A1c MFr Bld: 5.9 % — ABNORMAL HIGH (ref 4.8–5.6)

## 2023-01-29 LAB — BMP8+EGFR
BUN/Creatinine Ratio: 18 (ref 12–28)
BUN: 20 mg/dL (ref 8–27)
CO2: 22 mmol/L (ref 20–29)
Calcium: 9.4 mg/dL (ref 8.7–10.3)
Chloride: 108 mmol/L — ABNORMAL HIGH (ref 96–106)
Creatinine, Ser: 1.13 mg/dL — ABNORMAL HIGH (ref 0.57–1.00)
Glucose: 83 mg/dL (ref 70–99)
Potassium: 4.4 mmol/L (ref 3.5–5.2)
Sodium: 145 mmol/L — ABNORMAL HIGH (ref 134–144)
eGFR: 51 mL/min/{1.73_m2} — ABNORMAL LOW (ref >59)

## 2023-02-07 DIAGNOSIS — E66811 Obesity, class 1: Secondary | ICD-10-CM | POA: Insufficient documentation

## 2023-02-07 DIAGNOSIS — E6609 Other obesity due to excess calories: Secondary | ICD-10-CM | POA: Insufficient documentation

## 2023-02-07 DIAGNOSIS — L659 Nonscarring hair loss, unspecified: Secondary | ICD-10-CM | POA: Insufficient documentation

## 2023-02-07 MED ORDER — FARXIGA 10 MG PO TABS: ORAL_TABLET | ORAL | 0 refills | Status: DC

## 2023-02-07 NOTE — Assessment & Plan Note (Signed)
I will check iron levels. Importance of adequate nutrition to help with hair growth was discussed with the patient. She declines Derm referral at this time.

## 2023-02-07 NOTE — Assessment & Plan Note (Addendum)
Chronic, appears to be euvolemic at this time. She will continue with metoprolol XL 100mg , spironolactone 25mg , furosemide 20mg  and Entresto 24/26mg  as per Cardiology.

## 2023-02-07 NOTE — Assessment & Plan Note (Signed)
Chronic, well controlled. She is encouraged to follow low sodium diet.

## 2023-02-07 NOTE — Assessment & Plan Note (Signed)
She is advised to aim for at least 150 minutes of exercise per week.

## 2023-02-07 NOTE — Assessment & Plan Note (Addendum)
Chronic, rate controlled on Bblocker and adequately anticoagulated with Eliquis. Heart rate in 40s, may need decrease in metoprolol XL. Defer med management to Cardiology.

## 2023-02-07 NOTE — Assessment & Plan Note (Addendum)
Chronic, she will continue with [place] with MWF dosing.  She is encouraged to follow current dietary recommendations. She will follow up in 3-4 months for re-evaluation.

## 2023-02-24 ENCOUNTER — Other Ambulatory Visit: Payer: Self-pay

## 2023-02-24 DIAGNOSIS — E1122 Type 2 diabetes mellitus with diabetic chronic kidney disease: Secondary | ICD-10-CM

## 2023-02-24 MED ORDER — FARXIGA 10 MG PO TABS
ORAL_TABLET | ORAL | Status: DC
Start: 2023-02-24 — End: 2023-02-26

## 2023-02-26 ENCOUNTER — Other Ambulatory Visit: Payer: Self-pay

## 2023-02-26 DIAGNOSIS — N182 Chronic kidney disease, stage 2 (mild): Secondary | ICD-10-CM

## 2023-02-26 DIAGNOSIS — E1122 Type 2 diabetes mellitus with diabetic chronic kidney disease: Secondary | ICD-10-CM

## 2023-02-26 MED ORDER — FARXIGA 10 MG PO TABS
ORAL_TABLET | ORAL | Status: DC
Start: 2023-02-26 — End: 2023-03-01

## 2023-03-01 ENCOUNTER — Other Ambulatory Visit: Payer: Self-pay

## 2023-03-01 DIAGNOSIS — E1122 Type 2 diabetes mellitus with diabetic chronic kidney disease: Secondary | ICD-10-CM

## 2023-03-01 MED ORDER — FARXIGA 10 MG PO TABS
ORAL_TABLET | ORAL | 2 refills | Status: DC
Start: 2023-03-01 — End: 2023-03-04

## 2023-03-04 ENCOUNTER — Other Ambulatory Visit: Payer: Self-pay

## 2023-03-04 DIAGNOSIS — N182 Chronic kidney disease, stage 2 (mild): Secondary | ICD-10-CM

## 2023-03-04 MED ORDER — FARXIGA 10 MG PO TABS
ORAL_TABLET | ORAL | 2 refills | Status: DC
Start: 2023-03-04 — End: 2023-03-08

## 2023-03-07 ENCOUNTER — Other Ambulatory Visit: Payer: Self-pay | Admitting: Internal Medicine

## 2023-03-07 DIAGNOSIS — E1122 Type 2 diabetes mellitus with diabetic chronic kidney disease: Secondary | ICD-10-CM

## 2023-03-08 ENCOUNTER — Other Ambulatory Visit (HOSPITAL_COMMUNITY): Payer: Self-pay | Admitting: Pharmacist

## 2023-03-08 MED ORDER — APIXABAN 5 MG PO TABS: 5.0000 mg | ORAL_TABLET | Freq: Two times a day (BID) | ORAL | 11 refills | Status: DC

## 2023-05-13 ENCOUNTER — Encounter: Payer: Self-pay | Admitting: Internal Medicine

## 2023-05-13 ENCOUNTER — Ambulatory Visit: Payer: Medicare Other | Admitting: Internal Medicine

## 2023-05-13 ENCOUNTER — Ambulatory Visit: Payer: Medicare Other

## 2023-05-13 VITALS — BP 122/70 | HR 50 | Temp 98.0°F | Ht 61.0 in | Wt 165.0 lb

## 2023-05-13 VITALS — BP 122/70 | HR 50 | Temp 98.0°F | Ht 61.4 in | Wt 165.0 lb

## 2023-05-13 DIAGNOSIS — E1122 Type 2 diabetes mellitus with diabetic chronic kidney disease: Secondary | ICD-10-CM

## 2023-05-13 DIAGNOSIS — N182 Chronic kidney disease, stage 2 (mild): Secondary | ICD-10-CM

## 2023-05-13 DIAGNOSIS — Z23 Encounter for immunization: Secondary | ICD-10-CM

## 2023-05-13 DIAGNOSIS — E6609 Other obesity due to excess calories: Secondary | ICD-10-CM | POA: Diagnosis not present

## 2023-05-13 DIAGNOSIS — R0982 Postnasal drip: Secondary | ICD-10-CM

## 2023-05-13 DIAGNOSIS — Z6831 Body mass index (BMI) 31.0-31.9, adult: Secondary | ICD-10-CM | POA: Diagnosis not present

## 2023-05-13 DIAGNOSIS — E66811 Obesity, class 1: Secondary | ICD-10-CM

## 2023-05-13 DIAGNOSIS — I130 Hypertensive heart and chronic kidney disease with heart failure and stage 1 through stage 4 chronic kidney disease, or unspecified chronic kidney disease: Secondary | ICD-10-CM | POA: Diagnosis not present

## 2023-05-13 DIAGNOSIS — Z0000 Encounter for general adult medical examination without abnormal findings: Secondary | ICD-10-CM

## 2023-05-13 DIAGNOSIS — I5020 Unspecified systolic (congestive) heart failure: Secondary | ICD-10-CM

## 2023-05-13 DIAGNOSIS — E7800 Pure hypercholesterolemia, unspecified: Secondary | ICD-10-CM

## 2023-05-13 MED ORDER — LORATADINE 10 MG PO TABS: 10.0000 mg | ORAL_TABLET | Freq: Every day | ORAL | 1 refills | Status: DC | PRN

## 2023-05-13 NOTE — Progress Notes (Signed)
Subjective:   Renee Bradley is a 77 y.o. female who presents for Medicare Annual (Subsequent) preventive examination.  Visit Complete: In person    Cardiac Risk Factors include: advanced age (>84men, >8 women);diabetes mellitus;dyslipidemia;hypertension     Objective:    Today's Vitals   05/13/23 1005  BP: 122/70  Pulse: (!) 50  Temp: 98 F (36.7 C)  TempSrc: Oral  SpO2: 95%  Weight: 165 lb (74.8 kg)  Height: 5' 1.4" (1.56 m)   Body mass index is 30.77 kg/m.     05/13/2023   10:18 AM 11/22/2022   11:54 AM 09/02/2022    9:47 AM 08/12/2022    1:41 PM 07/27/2022   11:09 AM 07/27/2022   12:31 AM 04/23/2022   10:17 AM  Advanced Directives  Does Patient Have a Medical Advance Directive? No No Yes No  No No  Type of Publishing rights manager of Healthcare Power of Attorney in Chart?   No - copy requested      Would patient like information on creating a medical advance directive? Yes (MAU/Ambulatory/Procedural Areas - Information given)   No - Patient declined No - Patient declined  No - Patient declined    Current Medications (verified) Outpatient Encounter Medications as of 05/13/2023  Medication Sig   apixaban (ELIQUIS) 5 MG TABS tablet Take 1 tablet (5 mg total) by mouth 2 (two) times daily.   dapagliflozin propanediol (FARXIGA) 10 MG TABS tablet TAKE 1 TABLET BY MOUTH EVERY DAY   furosemide (LASIX) 20 MG tablet Take 1 tablet (20 mg total) by mouth daily as needed.   Lancets (ONETOUCH DELICA PLUS LANCET33G) MISC    metoprolol succinate (TOPROL-XL) 100 MG 24 hr tablet TAKE 1 TABLET BY MOUTH EVERY DAY   ONETOUCH VERIO test strip    potassium chloride SA (KLOR-CON M) 20 MEQ tablet Take 1 tablet (20 mEq total) by mouth daily. (Patient taking differently: Take 20 mEq by mouth daily. When takes fursomide)   rosuvastatin (CRESTOR) 40 MG tablet TAKE 1 TABLET BY MOUTH EVERY DAY   sacubitril-valsartan (ENTRESTO) 24-26 MG Take 1 tablet by  mouth 2 (two) times daily.   spironolactone (ALDACTONE) 25 MG tablet Take 0.5 tablets (12.5 mg total) by mouth daily.   No facility-administered encounter medications on file as of 05/13/2023.    Allergies (verified) Janumet [sitagliptin-metformin hcl], Januvia [sitagliptin], Kombiglyze [saxagliptin-metformin er], Sulfa antibiotics, and Elemental sulfur   History: Past Medical History:  Diagnosis Date   Asthma    Chest pressure 07/13/2012   Diabetes mellitus without complication (HCC)    DM (diabetes mellitus) (HCC) 07/13/2012   HTN (hypertension) 07/13/2012   Hypercholesterolemia    Hypertension    SOB (shortness of breath) 07/13/2012   Tachycardia, unspecified, "fluttering" 07/13/2012   Past Surgical History:  Procedure Laterality Date   ABDOMINAL HYSTERECTOMY     CARDIOVERSION N/A 09/02/2022   Procedure: CARDIOVERSION;  Surgeon: [name], DO;  Location: [facility];  Service: Cardiovascular;  Laterality: N/A;   TEE WITHOUT CARDIOVERSION N/A 09/02/2022   Procedure: TRANSESOPHAGEAL ECHOCARDIOGRAM (TEE);  Surgeon: [name], DO;  Location: [facility];  Service: Cardiovascular;  Laterality: N/A;   Family History  Problem Relation Age of Onset   Heart attack Mother    Diabetes type II Mother    Stroke Mother    Alzheimer's disease Father    Diabetes type II Brother    Social History   Socioeconomic History  Marital status: Married    Spouse name: Not on file   Number of children: 2   Years of education: Not on file   Highest education level: Not on file  Occupational History   Occupation: retired  Tobacco Use   Smoking status: Former    Current packs/day: 0.00    Types: Cigarettes    Start date: 05/12/1981    Quit date: 05/13/1983    Years since quitting: 40.0   Smokeless tobacco: Never  Vaping Use   Vaping status: Never Used  Substance and Sexual Activity   Alcohol use: No   Drug use: No   Sexual activity: Yes  Other Topics Concern    Not on file  Social History Narrative   Not on file   Social Determinants of Health   Financial Resource Strain: Low Risk  (05/13/2023)   Overall Financial Resource Strain (CARDIA)    Difficulty of Paying Living Expenses: Not hard at all  Food Insecurity: No Food Insecurity (05/13/2023)   Hunger Vital Sign    Worried About Running Out of Food in the Last Year: Never true    Ran Out of Food in the Last Year: Never true  Transportation Needs: No Transportation Needs (05/13/2023)   PRAPARE - Administrator, Civil Service (Medical): No    Lack of Transportation (Non-Medical): No  Physical Activity: Inactive (05/13/2023)   Exercise Vital Sign    Days of Exercise per Week: 0 days    Minutes of Exercise per Session: 0 min  Stress: No Stress Concern Present (05/13/2023)   [employer] of Occupational Health - Occupational Stress Questionnaire    Feeling of Stress : Not at all  Social Connections: Moderately Integrated (05/13/2023)   Social Connection and Isolation Panel [NHANES]    Frequency of Communication with Friends and Family: More than three times a week    Frequency of Social Gatherings with Friends and Family: Three times a week    Attends Religious Services: More than 4 times per year    Active Member of Clubs or Organizations: No    Attends Banker Meetings: Never    Marital Status: Married    Tobacco Counseling Counseling given: Not Answered   Clinical Intake:  Pre-visit preparation completed: Yes  Pain : No/denies pain     Nutritional Status: BMI > 30  Obese Nutritional Risks: None Diabetes: Yes CBG done?: No Did pt. bring in CBG monitor from home?: No  How often do you need to have someone help you when you read instructions, pamphlets, or other written materials from your doctor or pharmacy?: 1 - Never  Interpreter Needed?: No  Information entered by :: NAllen LPN   Activities of Daily Living    05/13/2023   10:07 AM  07/27/2022   11:09 AM  In your present state of health, do you have any difficulty performing the following activities:  Hearing? 0 0  Vision? 0 0  Difficulty concentrating or making decisions? 0 0  Walking or climbing stairs? 1 0  Comment only if they are steep   Dressing or bathing? 0 0  Doing errands, shopping? 0 0  Preparing Food and eating ? N   Using the Toilet? N   In the past six months, have you accidently leaked urine? N   Do you have problems with loss of bowel control? N   Managing your Medications? N   Managing your Finances? N   Housekeeping or managing your Housekeeping? N  Patient Care Team: Dorothyann Peng, MD as PCP - General (Internal Medicine) Runell Gess, MD as PCP - Cardiology (Cardiology) Center, [facility]  Indicate any recent Medical Services you may have received from other than [facility] providers in the past year (date may be approximate).     Assessment:   This is a routine wellness examination for Elektra.  Hearing/Vision screen Hearing Screening - Comments:: Denies hearing issues Vision Screening - Comments:: Regular eye exams, [facility]   Goals Addressed             This Visit's Progress    Patient Stated       05/13/2023, try to continue improve diet and watch intake, eat no later than 5p       Depression Screen    05/13/2023   10:22 AM 11/30/2022    2:23 PM 04/23/2022   10:18 AM 03/13/2021    3:57 PM 02/07/2020    2:32 PM 06/19/2019    3:08 PM 02/02/2019    3:12 PM  PHQ 2/9 Scores  PHQ - 2 Score 0 0 0 0 0 0 0  PHQ- 9 Score 0 0     0    Fall Risk    05/13/2023   10:21 AM 11/30/2022    2:23 PM 08/24/2022   12:08 PM 04/23/2022   10:18 AM 03/13/2021    3:56 PM  Fall Risk   Falls in the past year? 0 0 0 0 0  Number falls in past yr: 0 0 0 0   Injury with Fall? 0 0 0 0   Risk for fall due to : Medication side effect No Fall Risks No Fall Risks Medication side effect Medication side effect  Follow up Falls  prevention discussed;Falls evaluation completed Falls evaluation completed Falls evaluation completed Falls prevention discussed;Education provided;Falls evaluation completed Falls evaluation completed;Education provided;Falls prevention discussed    MEDICARE RISK AT HOME: Medicare Risk at Home Any stairs in or around the home?: Yes If so, are there any without handrails?: No Home free of loose throw rugs in walkways, pet beds, electrical cords, etc?: Yes Adequate lighting in your home to reduce risk of falls?: Yes Life alert?: No Use of a cane, walker or w/c?: No Grab bars in the bathroom?: Yes Shower chair or bench in shower?: No Elevated toilet seat or a handicapped toilet?: Yes  TIMED UP AND GO:  Was the test performed?  Yes  Length of time to ambulate 10 feet: 6 sec Gait steady and fast without use of assistive device    Cognitive Function:        05/13/2023   10:24 AM 04/23/2022   10:19 AM 03/13/2021    3:58 PM 02/07/2020    2:34 PM 02/02/2019    3:17 PM  6CIT Screen  What Year? 0 points 0 points 0 points 0 points 0 points  What month? 0 points 0 points 0 points 0 points 0 points  What time? 0 points 0 points 0 points 0 points 0 points  Count back from 20 0 points 0 points 0 points 0 points 0 points  Months in reverse 0 points 0 points 0 points 0 points 0 points  Repeat phrase 0 points 4 points 4 points 4 points 0 points  Total Score 0 points 4 points 4 points 4 points 0 points    Immunizations Immunization History  Administered Date(s) Administered   DTaP 07/11/2013   Fluad Quad(high Dose 65+) 05/31/2020, 07/17/2021   Fluad  Trivalent(High Dose 65+) 05/13/2023   Influenza, High Dose Seasonal PF 06/14/2018, 05/16/2019   PFIZER(Purple Top)SARS-COV-2 Vaccination 10/13/2019, 11/03/2019   Pfizer Covid-19 Vaccine Bivalent Booster 44yrs & up 08/28/2021    TDAP status: Up to date  Flu Vaccine status: Declined, Education has been provided regarding the importance of this  vaccine but patient still declined. Advised may receive this vaccine at local pharmacy or Health Dept. Aware to provide a copy of the vaccination record if obtained from local pharmacy or Health Dept. Verbalized acceptance and understanding.  Pneumococcal vaccine status: Declined,  Education has been provided regarding the importance of this vaccine but patient still declined. Advised may receive this vaccine at local pharmacy or Health Dept. Aware to provide a copy of the vaccination record if obtained from local pharmacy or Health Dept. Verbalized acceptance and understanding.   Covid-19 vaccine status: Information provided on how to obtain vaccines.   Qualifies for Shingles Vaccine? Yes   Zostavax completed No   Shingrix Completed?: No.    Education has been provided regarding the importance of this vaccine. Patient has been advised to call insurance company to determine out of pocket expense if they have not yet received this vaccine. Advised may also receive vaccine at local pharmacy or Health Dept. Verbalized acceptance and understanding.  Screening Tests Health Maintenance  Topic Date Due   FOOT EXAM  02/02/2020   OPHTHALMOLOGY EXAM  03/25/2023   COVID-19 Vaccine (4 - 2023-24 season) 04/04/2023   Diabetic kidney evaluation - Urine ACR  04/24/2023   Zoster Vaccines- Shingrix (1 of 2) 08/13/2023 (Originally 02/09/1965)   Pneumonia Vaccine 70+ Years old (1 of 2 - PCV) 11/30/2023 (Originally 02/09/1952)   DTaP/Tdap/Td (2 - Tdap) 07/12/2023   HEMOGLOBIN A1C  07/30/2023   Diabetic kidney evaluation - eGFR measurement  01/28/2024   Medicare Annual Wellness (AWV)  05/12/2024   INFLUENZA VACCINE  Completed   DEXA SCAN  Completed   Hepatitis C Screening  Completed   HPV VACCINES  Aged Out   Fecal DNA (Cologuard)  Discontinued    Health Maintenance  Health Maintenance Due  Topic Date Due   FOOT EXAM  02/02/2020   OPHTHALMOLOGY EXAM  03/25/2023   COVID-19 Vaccine (4 - 2023-24 season)  04/04/2023   Diabetic kidney evaluation - Urine ACR  04/24/2023    Colorectal cancer screening: No longer required.   Mammogram status: Completed 01/05/2022. Repeat every year  Bone Density status: Completed 09/27/2017.   Lung Cancer Screening: (Low Dose CT Chest recommended if Age 46-80 years, 20 pack-year currently smoking OR have quit w/in 15years.) does not qualify.   Lung Cancer Screening Referral: no  Additional Screening:  Hepatitis C Screening: does qualify; Completed 02/07/2020  Vision Screening: Recommended annual ophthalmology exams for early detection of glaucoma and other disorders of the eye. Is the patient up to date with their annual eye exam?  No  Who is the provider or what is the name of the office in which the patient attends annual eye exams? [facility] If pt is not established with a provider, would they like to be referred to a provider to establish care? No .   Dental Screening: Recommended annual dental exams for proper oral hygiene  Diabetic Foot Exam: Diabetic Foot Exam: Overdue, Pt has been advised about the importance in completing this exam. Pt is scheduled for diabetic foot exam on next appointment.  Community Resource Referral / Chronic Care Management: CRR required this visit?  No   CCM  required this visit?  No     Plan:     I have personally reviewed and noted the following in the patient's chart:   Medical and social history Use of alcohol, tobacco or illicit drugs  Current medications and supplements including opioid prescriptions. Patient is not currently taking opioid prescriptions. Functional ability and status Nutritional status Physical activity Advanced directives List of other physicians Hospitalizations, surgeries, and ER visits in previous 12 months Vitals Screenings to include cognitive, depression, and falls Referrals and appointments  In addition, I have reviewed and discussed with patient certain preventive  protocols, quality metrics, and best practice recommendations. A written personalized care plan for preventive services as well as general preventive health recommendations were provided to patient.     Barb Merino, LPN   [id number]   After Visit Summary: (In Person-Printed) AVS printed and given to the patient  Nurse Notes: none

## 2023-05-13 NOTE — Patient Instructions (Signed)

## 2023-05-13 NOTE — Patient Instructions (Signed)
Renee Bradley , Thank you for taking time to come for your Medicare Wellness Visit. I appreciate your ongoing commitment to your health goals. Please review the following plan we discussed and let me know if I can assist you in the future.   Referrals/Orders/Follow-Ups/Clinician Recommendations: none  This is a list of the screening recommended for you and due dates:  Health Maintenance  Topic Date Due   Complete foot exam   02/02/2020   Eye exam for diabetics  03/25/2023   COVID-19 Vaccine (4 - 2023-24 season) 04/04/2023   Yearly kidney health urinalysis for diabetes  04/24/2023   Zoster (Shingles) Vaccine (1 of 2) 08/13/2023*   Pneumonia Vaccine (1 of 2 - PCV) 11/30/2023*   DTaP/Tdap/Td vaccine (2 - Tdap) 07/12/2023   Hemoglobin A1C  07/30/2023   Yearly kidney function blood test for diabetes  01/28/2024   Medicare Annual Wellness Visit  05/12/2024   Flu Shot  Completed   DEXA scan (bone density measurement)  Completed   Hepatitis C Screening  Completed   HPV Vaccine  Aged Out   Cologuard (Stool DNA test)  Discontinued  *Topic was postponed. The date shown is not the original due date.    Advanced directives: (Provided) Advance directive discussed with you today. I have provided a copy for you to complete at home and have notarized. Once this is complete, please bring a copy in to our office so we can scan it into your chart.   Next Medicare Annual Wellness Visit scheduled for next year: No, office will schedule  Insert Preventive Care attachment Insert FALL PREVENTION attachment if needed

## 2023-05-13 NOTE — Progress Notes (Signed)
I,[name], CMA,acting as a Neurosurgeon for [name], MD.,have documented all relevant documentation on the behalf of [name], MD,as directed by  [name], MD while in the presence of [name], MD.  Subjective:  Patient ID: Renee Bradley , female    DOB: 1945-11-04 , 76 y.o.   MRN: [id number]  Chief Complaint  Patient presents with   Diabetes   Hypertension   Hyperlipidemia    HPI  She presents today for diabetes and htn check. She reports compliance with meds.  She denies chest pain and shortness of breath.   AWV completed with [facility] Advisor: Nicke[place].  Diabetes She presents for her follow-up diabetic visit. She has type 2 diabetes mellitus. Her disease course has been stable. There are no hypoglycemic associated symptoms. There are no diabetic associated symptoms. Pertinent negatives for diabetes include no blurred vision, no chest pain, no polydipsia, no polyphagia and no polyuria. There are no hypoglycemic complications. Diabetic complications include nephropathy. Risk factors for coronary artery disease include diabetes mellitus, dyslipidemia, hypertension, post-menopausal, sedentary lifestyle and obesity. She is compliant with treatment some of the time. Her weight is fluctuating minimally. She is following a diabetic diet. She participates in exercise intermittently. An ACE inhibitor/angiotensin II receptor blocker is not being taken.  Hypertension This is a chronic problem. The current episode started more than 1 year ago. The problem has been gradually improving since onset. The problem is uncontrolled. Pertinent negatives include no blurred vision or chest pain. The current treatment provides moderate improvement. Hypertensive end-organ damage includes kidney disease.     Past Medical History:  Diagnosis Date   Asthma    Chest pressure 07/13/2012   Diabetes mellitus without complication (HCC)    DM (diabetes mellitus) (HCC) 07/13/2012   HTN  (hypertension) 07/13/2012   Hypercholesterolemia    Hypertension    SOB (shortness of breath) 07/13/2012   Tachycardia, unspecified, "fluttering" 07/13/2012     Family History  Problem Relation Age of Onset   Heart attack Mother    Diabetes type II Mother    Stroke Mother    Alzheimer's disease Father    Diabetes type II Brother      Current Outpatient Medications:    loratadine (CLARITIN) 10 MG tablet, Take 1 tablet (10 mg total) by mouth daily as needed for allergies., Disp: 30 tablet, Rfl: 1   apixaban (ELIQUIS) 5 MG TABS tablet, Take 1 tablet (5 mg total) by mouth 2 (two) times daily., Disp: 60 tablet, Rfl: 11   dapagliflozin propanediol (FARXIGA) 10 MG TABS tablet, TAKE 1 TABLET BY MOUTH EVERY DAY (Patient taking differently: Take 10 mg by mouth 3 (three) times a week. Take one tablet by mouth every Monday,Wednesday and Friday), Disp: 30 tablet, Rfl: 2   furosemide (LASIX) 20 MG tablet, Take 1 tablet (20 mg total) by mouth daily as needed., Disp: 90 tablet, Rfl: 3   Lancets (ONETOUCH DELICA PLUS LANCET33G) MISC, , Disp: , Rfl:    metoprolol succinate (TOPROL-XL) 100 MG 24 hr tablet, TAKE 1 TABLET BY MOUTH EVERY DAY, Disp: 30 tablet, Rfl: 6   ONETOUCH VERIO test strip, , Disp: , Rfl:    potassium chloride SA (KLOR-CON M) 20 MEQ tablet, Take 1 tablet (20 mEq total) by mouth daily as needed. ONLY TAKE POTASSIUM (1) TABLET WHEN YOU TAKE LASIX (FUROSEMIDE), Disp: 90 tablet, Rfl: 3   rosuvastatin (CRESTOR) 40 MG tablet, TAKE 1 TABLET BY MOUTH EVERY DAY, Disp: 90  tablet, Rfl: 2   sacubitril-valsartan (ENTRESTO) 49-51 MG, Take 1 tablet by mouth 2 (two) times daily., Disp: 60 tablet, Rfl: 3   spironolactone (ALDACTONE) 25 MG tablet, Take 0.5 tablets (12.5 mg total) by mouth daily., Disp: 45 tablet, Rfl: 3   Allergies  Allergen Reactions   Janumet [Sitagliptin-Metformin Hcl] Shortness Of Breath   Januvia [Sitagliptin] Shortness Of Breath   Kombiglyze [Saxagliptin-Metformin Er]  Shortness Of Breath   Sulfa Antibiotics Shortness Of Breath   Elemental Sulfur Itching     Review of Systems  Constitutional: Negative.   HENT:  Positive for postnasal drip and rhinorrhea.   Eyes:  Negative for blurred vision.  Respiratory: Negative.    Cardiovascular: Negative.  Negative for chest pain.  Gastrointestinal: Negative.   Endocrine: Negative for polydipsia, polyphagia and polyuria.  Neurological: Negative.   Psychiatric/Behavioral: Negative.       Today's Vitals   05/13/23 1009  BP: 122/70  Pulse: (!) 50  Temp: 98 F (36.7 C)  SpO2: 98%  Weight: 165 lb (74.8 kg)  Height: 5\' 1"  (1.549 m)   Body mass index is 31.18 kg/m.  Wt Readings from Last 3 Encounters:  05/14/23 167 lb (75.8 kg)  05/13/23 165 lb (74.8 kg)  05/13/23 165 lb (74.8 kg)     Objective:  Physical Exam Vitals and nursing note reviewed.  Constitutional:      Appearance: Normal appearance. She is obese.  HENT:     Head: Normocephalic and atraumatic.  Eyes:     Extraocular Movements: Extraocular movements intact.  Cardiovascular:     Rate and Rhythm: Normal rate and regular rhythm.     Heart sounds: Normal heart sounds.  Pulmonary:     Effort: Pulmonary effort is normal.     Breath sounds: Normal breath sounds.  Musculoskeletal:     Cervical back: Normal range of motion.  Skin:    General: Skin is warm.  Neurological:     General: No focal deficit present.     Mental Status: She is alert.  Psychiatric:        Mood and Affect: Mood normal.        Behavior: Behavior normal.         Assessment And Plan:  Type 2 diabetes mellitus with stage 2 chronic kidney disease, without long-term current use of insulin (HCC) Assessment & Plan: Chronic, she will continue with [place] with MWF dosing.  She is encouraged to follow current dietary recommendations. She will follow up in 3-4 months for re-evaluation.   Orders: -     CMP14+EGFR -     Lipid panel -     Hemoglobin A1c -      Microalbumin / creatinine urine ratio  Hypertensive heart and renal disease with renal failure, stage 1 through stage 4 or unspecified chronic kidney disease, with heart failure (HCC) Assessment & Plan: Chronic, well controlled. She is encouraged to follow low sodium diet. She will continue with furosemide 20mg  daily, metoprolol XL 100mg  daily, spironolactone 25mg  1/2 tab daily and Entresto 24/26mg  twice daily.    Pure hypercholesterolemia Assessment & Plan: Chronic, LDL goal is less than 70.  She will continue with rosuvastatin 40mg  daily. Encouraged to follow heart healthy diet.    HFrEF (heart failure with reduced ejection fraction) (HCC) Assessment & Plan: Chronic, appears to be euvolemic at this time. She will continue with metoprolol XL 100mg , spironolactone 25mg , furosemide 20mg  and Entresto 24/26mg  as per Cardiology.    Postnasal drip Assessment &  Plan: She agrees to try loratadine 10mg  daily. Once symptoms improve, she can change to every other day dosing.    Class 1 obesity due to excess calories with serious comorbidity and body mass index (BMI) of 31.0 to 31.9 in adult Assessment & Plan: She is advised to aim for at least 150 minutes of exercise per week.    Other orders -     Loratadine; Take 1 tablet (10 mg total) by mouth daily as needed for allergies.  Dispense: 30 tablet; Refill: 1  She is encouraged to strive for BMI less than 30 to decrease cardiac risk. Advised to aim for at least 150 minutes of exercise per week.    Return for  1 year ov w rs & thn. 4 month bp & dm f/u.Marland Kitchen  Patient was given opportunity to ask questions. Patient verbalized understanding of the plan and was able to repeat key elements of the plan. All questions were answered to their satisfaction.    I, [name], MD, have reviewed all documentation for this visit. The documentation on 05/13/23 for the exam, diagnosis, procedures, and orders are all accurate and complete.   IF YOU HAVE  BEEN REFERRED TO A SPECIALIST, IT MAY TAKE 1-2 WEEKS TO SCHEDULE/PROCESS THE REFERRAL. IF YOU HAVE NOT HEARD FROM US/SPECIALIST IN TWO WEEKS, PLEASE GIVE [place] A CALL AT [phone].   THE PATIENT IS ENCOURAGED TO PRACTICE SOCIAL DISTANCING DUE TO THE COVID-19 PANDEMIC.

## 2023-05-14 ENCOUNTER — Ambulatory Visit (HOSPITAL_COMMUNITY)
Admission: RE | Admit: 2023-05-14 | Discharge: 2023-05-14 | Disposition: A | Discharge status: Home / Self Care | Payer: Medicare Other | Source: Ambulatory Visit | Attending: Cardiology | Admitting: Cardiology

## 2023-05-14 ENCOUNTER — Encounter (HOSPITAL_COMMUNITY): Payer: Self-pay | Admitting: Cardiology

## 2023-05-14 VITALS — BP 152/80 | HR 54 | Wt 167.0 lb

## 2023-05-14 DIAGNOSIS — I10 Essential (primary) hypertension: Secondary | ICD-10-CM

## 2023-05-14 DIAGNOSIS — I639 Cerebral infarction, unspecified: Secondary | ICD-10-CM | POA: Diagnosis not present

## 2023-05-14 DIAGNOSIS — I4811 Longstanding persistent atrial fibrillation: Secondary | ICD-10-CM

## 2023-05-14 DIAGNOSIS — E119 Type 2 diabetes mellitus without complications: Secondary | ICD-10-CM

## 2023-05-14 DIAGNOSIS — I5021 Acute systolic (congestive) heart failure: Secondary | ICD-10-CM

## 2023-05-14 DIAGNOSIS — I5020 Unspecified systolic (congestive) heart failure: Secondary | ICD-10-CM | POA: Insufficient documentation

## 2023-05-14 LAB — LIPID PANEL
Chol/HDL Ratio: 2.3 {ratio} (ref 0.0–4.4)
Cholesterol, Total: 157 mg/dL (ref 100–199)
HDL: 67 mg/dL (ref >39)
LDL Chol Calc (NIH): 76 mg/dL (ref 0–99)
Triglycerides: 72 mg/dL (ref 0–149)
VLDL Cholesterol Cal: 14 mg/dL (ref 5–40)

## 2023-05-14 LAB — CMP14+EGFR
ALT: 17 [IU]/L (ref 0–32)
AST: 21 [IU]/L (ref 0–40)
Albumin: 4.3 g/dL (ref 3.8–4.8)
Alkaline Phosphatase: 59 [IU]/L (ref 44–121)
BUN/Creatinine Ratio: 20 (ref 12–28)
BUN: 26 mg/dL (ref 8–27)
Bilirubin Total: 1.4 mg/dL — ABNORMAL HIGH (ref 0.0–1.2)
CO2: 20 mmol/L (ref 20–29)
Calcium: 9.8 mg/dL (ref 8.7–10.3)
Chloride: 108 mmol/L — ABNORMAL HIGH (ref 96–106)
Creatinine, Ser: 1.28 mg/dL — ABNORMAL HIGH (ref 0.57–1.00)
Globulin, Total: 2.1 g/dL (ref 1.5–4.5)
Glucose: 89 mg/dL (ref 70–99)
Potassium: 4.4 mmol/L (ref 3.5–5.2)
Sodium: 145 mmol/L — ABNORMAL HIGH (ref 134–144)
Total Protein: 6.4 g/dL (ref 6.0–8.5)
eGFR: 43 mL/min/{1.73_m2} — ABNORMAL LOW (ref >59)

## 2023-05-14 LAB — HEMOGLOBIN A1C
Est. average glucose Bld gHb Est-mCnc: 126 mg/dL
Hgb A1c MFr Bld: 6 % — ABNORMAL HIGH (ref 4.8–5.6)

## 2023-05-14 LAB — MICROALBUMIN / CREATININE URINE RATIO
Creatinine, Urine: 143.7 mg/dL
Microalb/Creat Ratio: 9 mg/g{creat} (ref 0–29)
Microalbumin, Urine: 12.5 ug/mL

## 2023-05-14 MED ORDER — POTASSIUM CHLORIDE CRYS ER 20 MEQ PO TBCR: 20.0000 meq | EXTENDED_RELEASE_TABLET | Freq: Every day | ORAL | 3 refills | Status: AC | PRN

## 2023-05-14 MED ORDER — POTASSIUM CHLORIDE CRYS ER 20 MEQ PO TBCR: 20.0000 meq | EXTENDED_RELEASE_TABLET | Freq: Every day | ORAL | 3 refills | Status: DC

## 2023-05-14 MED ORDER — ENTRESTO 49-51 MG PO TABS: 1.0000 | ORAL_TABLET | Freq: Two times a day (BID) | ORAL | 3 refills | Status: DC

## 2023-05-14 NOTE — Patient Instructions (Signed)
Medication Changes:  PLEASE TAKE LASIX (FUROSEMIDE) 20MG  FOR 2 DAYS ONLY   PLEASE TAKE POTASSIUM FOR 2 DAYS-- PLEASE TAKE POTASSIUM ONLY WITH LASIX (FUROSEMIDE)  PLEASE INCREASE ENTRESTO 49/51MG  TWICE DAILY   YOU CAN HOLD APIXABAN (ELIQUIS) FOR 2 DAYS PRIOR TO DENTAL PROCEDURES   Follow-Up in: 3 MONTHS WITH ECHO- PLEASE CALL OUR OFFICE AROUND NOVEMBER 2024 TO GET SCHEDULED FOR YOUR APPOINTMENT. PHONE NUMBER IS [phone] OPTION 2   At the [facility], you and your health needs are our priority. We have a designated team specialized in the treatment of Heart Failure. This Care Team includes your primary Heart Failure Specialized Cardiologist (physician), Advanced Practice Providers (APPs- Physician Assistants and Nurse Practitioners), and Pharmacist who all work together to provide you with the care you need, when you need it.   You may see any of the following providers on your designated Care Team at your next follow up:  Dr. Arvilla Meres Dr. Marca Ancona Dr. [name] Dr. Theresia Bough Tonye Becket, NP Robbie Lis, [place] [facility] [place], [place] Brynda Peon, NP [place] Lee, NP Karle Plumber, PharmD   Please be sure to bring in all your medications bottles to every appointment.   Need to Contact us:  If you have any questions or concerns before your next appointment please send [place] a message through [place] or call our office at [phone].    TO LEAVE A MESSAGE FOR THE NURSE SELECT OPTION 2, PLEASE LEAVE A MESSAGE INCLUDING: YOUR NAME DATE OF BIRTH CALL BACK NUMBER REASON FOR CALL**this is important as we prioritize the call backs  YOU WILL RECEIVE A CALL BACK THE SAME DAY AS LONG AS YOU CALL BEFORE 4:00 PM

## 2023-05-14 NOTE — Progress Notes (Signed)
[facility] CLINIC NOTE  Referring Physician: Dorothyann Peng, MD  Primary Care: Dorothyann Peng, MD   HPI: Renee Bradley is a 77 y.o. female with type 2 diabetes, obesity, hypertension, atrial fibrillation with RVR in December 2023, right frontoparietal stroke in December 2023 and systolic heart failure (EF 30 to 35%) presenting today for follow up.  Her December 2023 when she presented with acute stroke, atrial fibrillation and newly diagnosed heart failure with reduced ejection fraction.  During her admission she was started on low-dose GDMT and since that time has undergone TEE cardioversion.  She was seen in [facility] clinic where GDMT was further uptitrated.  At her last appointment we continued to uptitrate GDMT.  Interval hx:  From a functional standpoint she is doing fairly well.  She is compliant with all medications but constantly remains concerned about their dosing and necessity.  Her husband is here today.  She reports having some PND/lightheadedness but nothing that is limiting limiting.  She otherwise performs all ADLs and goes on short walks without any difficulties.  Activity level/exercise tolerance:  NYHA II Paroxysmal noctural dyspnea:  No Chest pain/pressure:  No Orthostatic lightheadedness:  minimal Palpitations:  no Lower extremity edema:  mild edema around ankles.  Presyncope/syncope:  no Cough:  no  Past Medical History:  Diagnosis Date   Asthma    Chest pressure 07/13/2012   Diabetes mellitus without complication (HCC)    DM (diabetes mellitus) (HCC) 07/13/2012   HTN (hypertension) 07/13/2012   Hypercholesterolemia    Hypertension    SOB (shortness of breath) 07/13/2012   Tachycardia, unspecified, "fluttering" 07/13/2012    Current Outpatient Medications  Medication Sig Dispense Refill   apixaban (ELIQUIS) 5 MG TABS tablet Take 1 tablet (5 mg total) by mouth 2 (two) times daily. 60 tablet 11   dapagliflozin propanediol (FARXIGA) 10 MG TABS  tablet TAKE 1 TABLET BY MOUTH EVERY DAY (Patient taking differently: Take 10 mg by mouth 3 (three) times a week. Take one tablet by mouth every Monday,Wednesday and Friday) 30 tablet 2   furosemide (LASIX) 20 MG tablet Take 1 tablet (20 mg total) by mouth daily as needed. 90 tablet 3   Lancets (ONETOUCH DELICA PLUS LANCET33G) MISC      loratadine (CLARITIN) 10 MG tablet Take 1 tablet (10 mg total) by mouth daily as needed for allergies. 30 tablet 1   metoprolol succinate (TOPROL-XL) 100 MG 24 hr tablet TAKE 1 TABLET BY MOUTH EVERY DAY 30 tablet 6   ONETOUCH VERIO test strip      rosuvastatin (CRESTOR) 40 MG tablet TAKE 1 TABLET BY MOUTH EVERY DAY 90 tablet 2   sacubitril-valsartan (ENTRESTO) 49-51 MG Take 1 tablet by mouth 2 (two) times daily. 60 tablet 3   spironolactone (ALDACTONE) 25 MG tablet Take 0.5 tablets (12.5 mg total) by mouth daily. 45 tablet 3   potassium chloride SA (KLOR-CON M) 20 MEQ tablet Take 1 tablet (20 mEq total) by mouth daily as needed. ONLY TAKE POTASSIUM (1) TABLET WHEN YOU TAKE LASIX (FUROSEMIDE) 90 tablet 3   No current facility-administered medications for this encounter.    Allergies  Allergen Reactions   Janumet [Sitagliptin-Metformin Hcl] Shortness Of Breath   Januvia [Sitagliptin] Shortness Of Breath   Kombiglyze [Saxagliptin-Metformin Er] Shortness Of Breath   Sulfa Antibiotics Shortness Of Breath   Elemental Sulfur Itching      Social History   Socioeconomic History   Marital status: Married    Spouse name: Not  on file   Number of children: 2   Years of education: Not on file   Highest education level: Not on file  Occupational History   Occupation: retired  Tobacco Use   Smoking status: Former    Current packs/day: 0.00    Types: Cigarettes    Start date: 05/12/1981    Quit date: 05/13/1983    Years since quitting: 40.0   Smokeless tobacco: Never  Vaping Use   Vaping status: Never Used  Substance and Sexual Activity   Alcohol use:  No   Drug use: No   Sexual activity: Yes  Other Topics Concern   Not on file  Social History Narrative   Not on file   Social Determinants of Health   Financial Resource Strain: Low Risk  (05/13/2023)   Overall Financial Resource Strain (CARDIA)    Difficulty of Paying Living Expenses: Not hard at all  Food Insecurity: No Food Insecurity (05/13/2023)   Hunger Vital Sign    Worried About Running Out of Food in the Last Year: Never true    Ran Out of Food in the Last Year: Never true  Transportation Needs: No Transportation Needs (05/13/2023)   PRAPARE - Administrator, Civil Service (Medical): No    Lack of Transportation (Non-Medical): No  Physical Activity: Inactive (05/13/2023)   Exercise Vital Sign    Days of Exercise per Week: 0 days    Minutes of Exercise per Session: 0 min  Stress: No Stress Concern Present (05/13/2023)   [employer] of Occupational Health - Occupational Stress Questionnaire    Feeling of Stress : Not at all  Social Connections: Moderately Integrated (05/13/2023)   Social Connection and Isolation Panel [NHANES]    Frequency of Communication with Friends and Family: More than three times a week    Frequency of Social Gatherings with Friends and Family: Three times a week    Attends Religious Services: More than 4 times per year    Active Member of Clubs or Organizations: No    Attends Banker Meetings: Never    Marital Status: Married  Catering manager Violence: Not At Risk (05/13/2023)   Humiliation, Afraid, Rape, and Kick questionnaire    Fear of Current or Ex-Partner: No    Emotionally Abused: No    Physically Abused: No    Sexually Abused: No      Family History  Problem Relation Age of Onset   Heart attack Mother    Diabetes type II Mother    Stroke Mother    Alzheimer's disease Father    Diabetes type II Brother     PHYSICAL EXAM: Vitals:   05/14/23 1545  BP: (!) 152/80  Pulse: (!) 54  SpO2: 96%    GENERAL: Well nourished, well developed, and in no apparent distress at rest.  HEENT: Negative for arcus senilis or xanthelasma. There is no scleral icterus.  The mucous membranes are pink and moist.   NECK: Supple, No masses. Normal carotid upstrokes without bruits. No masses or thyromegaly.    CHEST: There are no chest wall deformities. There is no chest wall tenderness. Respirations are unlabored.  Lungs-mild crackles at the bases CARDIAC:  JVP: 8 cm          Normal rate with regular rhythm. No murmurs, rubs or gallops.  Pulses are 2+ and symmetrical in upper and lower extremities. 1-2+ edema.  ABDOMEN: Soft, non-tender, non-distended. There are no masses or hepatomegaly. There are normal bowel  sounds.  EXTREMITIES: Warm and well perfused with no cyanosis, clubbing.  LYMPHATIC: No axillary or supraclavicular lymphadenopathy.  NEUROLOGIC: Patient is oriented x3 with no focal or lateralizing neurologic deficits.  PSYCH: Patients affect is appropriate, there is no evidence of anxiety or depression.  SKIN: Warm and dry; no lesions or wounds.    DATA REVIEW  ECG: 09/02/22: sinus bradycardia with pauses  ECHO: 12/23: LVEF 30-35%. Normal RV function.  10/13/22: LVEF 45%. Normal RV function. Trivial MR.   CATH:N/A   ASSESSMENT & PLAN:  Heart Failure with reduced EF Etiology of FA:OZHYQM tachyarrhythmia/afib mediated; plan for coronary CTA.   NYHA class / AHA Stage:III Volume status & Diuretics:  mildly hypervolemic on exam; take lasix 20mg  x 3 days with klorcon ; otherwise stop potassium.  Vasodilators:Entresto 24/26mg  BID; increase to 49/51mg  BID.  Beta-Blocker: continue toprol 100mg  daily; HR in the 50s-60s but asymptomatic. MRA: continue spironolactone to 25mg  daily Cardiometabolic: continue farxiga 10mg  Devices therapies & Valvulopathies:not indicated Advanced therapies:not indicated  2. Atrial fibrillation -S/P TEE/DCCV -On Eliquis 5 BID. Reports compliance -Will  need dental work done; she can hold on anticoagulation for 2 days prior to her procedure.    3. HTN - Hypertensive today; increase Entresto to 49/51mg  BID.    4. HLD - continue crestor 40mg  daily - no myalgias or side effects.    5. DM II -Recent A1c 6.5% -continue farxiga 10mg .    6. CVA -Recent R frontoparietal junction infarct 12/23. Likely cardioembolic from AF. Seen by Neurology.  -Referred to Neuro follow-up -No residual deficits.  I spent 45 minutes caring for this patient today including face to face time, ordering and reviewing labs, , seeing the patient, extensive time spent discussing medications/doses and need for continued compliance, documenting in the record, and arranging follow ups.   Pervis Macintyre [facility] Mechanical Circulatory Support

## 2023-05-23 DIAGNOSIS — R0982 Postnasal drip: Secondary | ICD-10-CM | POA: Insufficient documentation

## 2023-05-23 NOTE — Assessment & Plan Note (Signed)
She is advised to aim for at least 150 minutes of exercise per week.

## 2023-05-23 NOTE — Assessment & Plan Note (Signed)
Chronic, LDL goal is less than 70.  She will continue with rosuvastatin 40mg  daily. Encouraged to follow heart healthy diet.

## 2023-05-23 NOTE — Assessment & Plan Note (Signed)
Chronic, appears to be euvolemic at this time. She will continue with metoprolol XL 100mg , spironolactone 25mg , furosemide 20mg  and Entresto 24/26mg  as per Cardiology.

## 2023-05-23 NOTE — Assessment & Plan Note (Signed)
Chronic, well controlled. She is encouraged to follow low sodium diet. She will continue with furosemide 20mg  daily, metoprolol XL 100mg  daily, spironolactone 25mg  1/2 tab daily and Entresto 24/26mg  twice daily.

## 2023-05-23 NOTE — Assessment & Plan Note (Signed)
She agrees to try loratadine 10mg  daily. Once symptoms improve, she can change to every other day dosing.

## 2023-05-23 NOTE — Assessment & Plan Note (Signed)
Chronic, she will continue with [place] with MWF dosing.  She is encouraged to follow current dietary recommendations. She will follow up in 3-4 months for re-evaluation.

## 2023-06-06 ENCOUNTER — Other Ambulatory Visit: Payer: Self-pay | Admitting: Internal Medicine

## 2023-06-07 ENCOUNTER — Telehealth (HOSPITAL_COMMUNITY): Payer: Self-pay | Admitting: Cardiology

## 2023-06-07 NOTE — Telephone Encounter (Signed)
Patient called to report  increase in dizziness since increasing entresto 24/26-->49/51  No b/p to report Weight stable Reports dizziness increases with movement   Please advise

## 2023-06-08 DIAGNOSIS — H35033 Hypertensive retinopathy, bilateral: Secondary | ICD-10-CM | POA: Diagnosis not present

## 2023-06-09 MED ORDER — ENTRESTO 24-26 MG PO TABS: 1.0000 | ORAL_TABLET | Freq: Two times a day (BID) | ORAL | 11 refills | Status: DC

## 2023-06-09 NOTE — Telephone Encounter (Signed)
Pt aware.

## 2023-06-15 IMAGING — MG MM DIGITAL SCREENING BILAT W/ TOMO AND CAD
8 series · 8 of 24 positions shown · non-contrast
Comparison: Previous exam(s).

ACR Breast Density Category a: The breast tissue is almost entirely
fatty.

CLINICAL DATA: Screening.

EXAM:
DIGITAL SCREENING BILATERAL MAMMOGRAM WITH TOMOSYNTHESIS AND CAD
TECHNIQUE: Bilateral screening digital craniocaudal and mediolateral oblique
mammograms were obtained. Bilateral screening digital breast
tomosynthesis was performed. The images were evaluated with
computer-aided detection.

[R CC synth-2D]
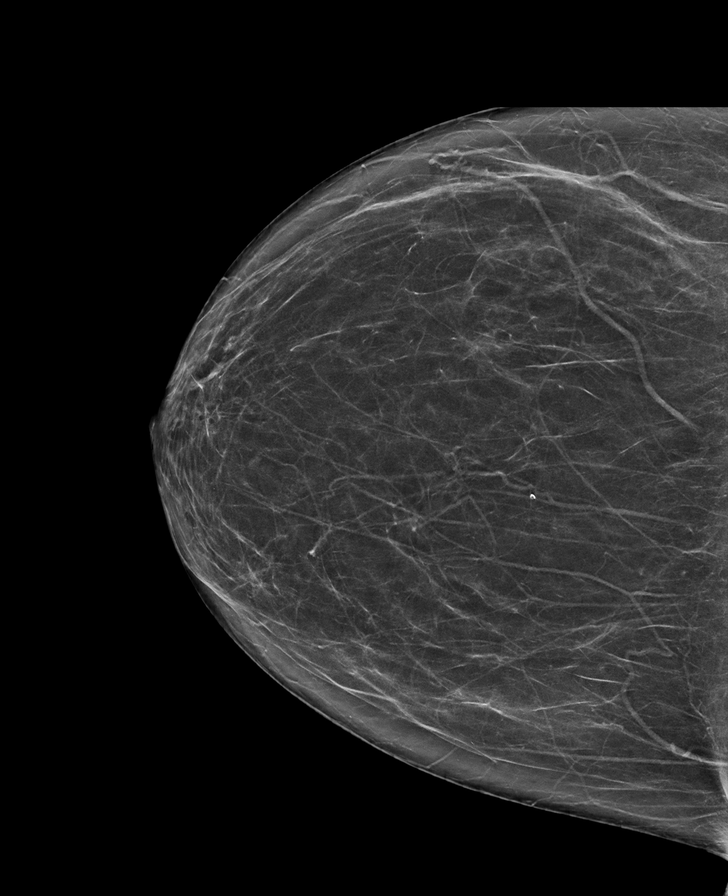

[L CC synth-2D]
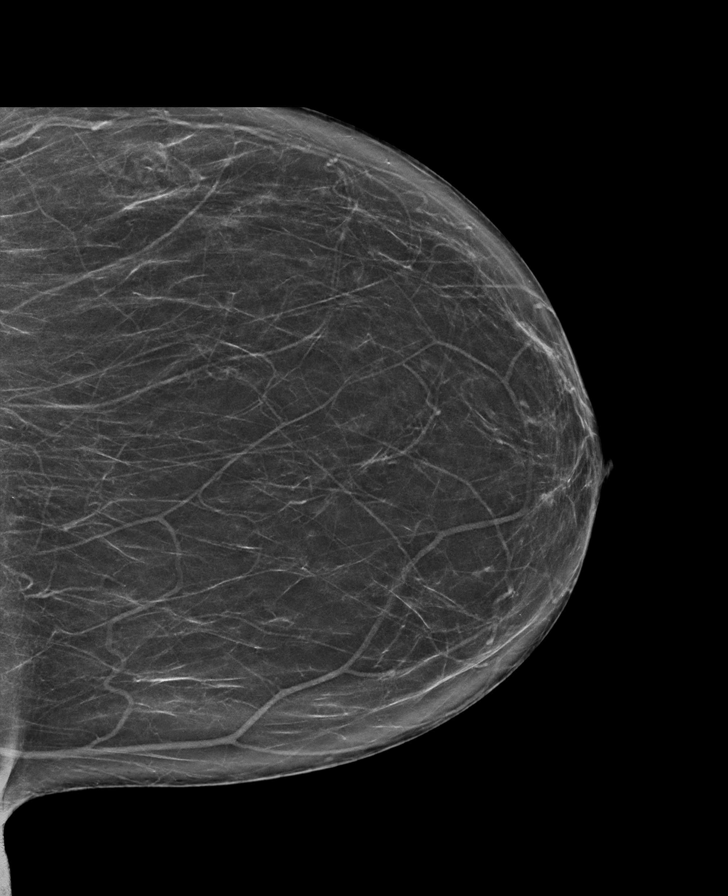

[L MLO synth-2D]
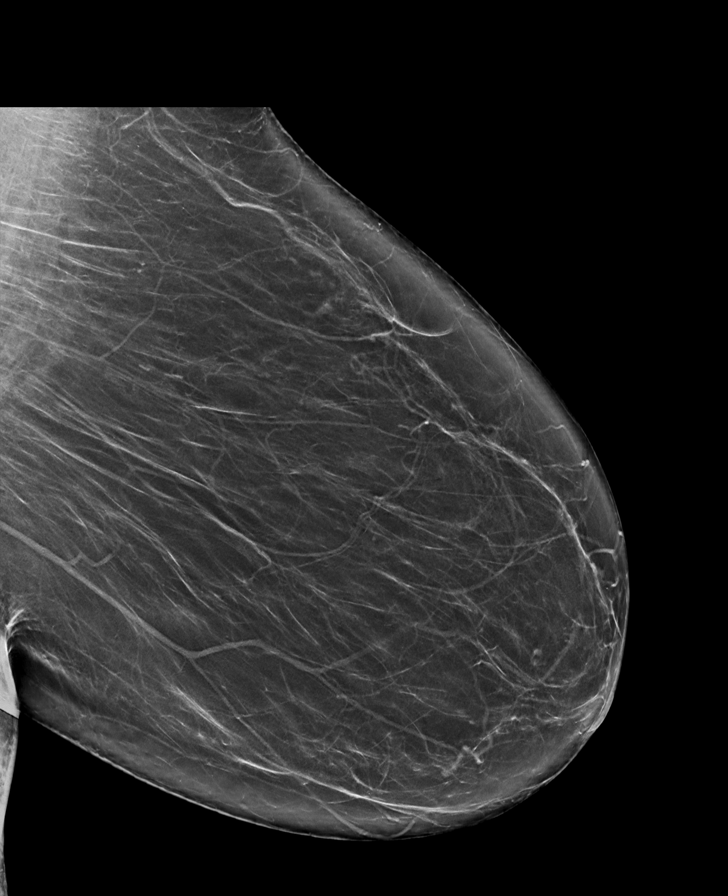

[R MLO synth-2D]
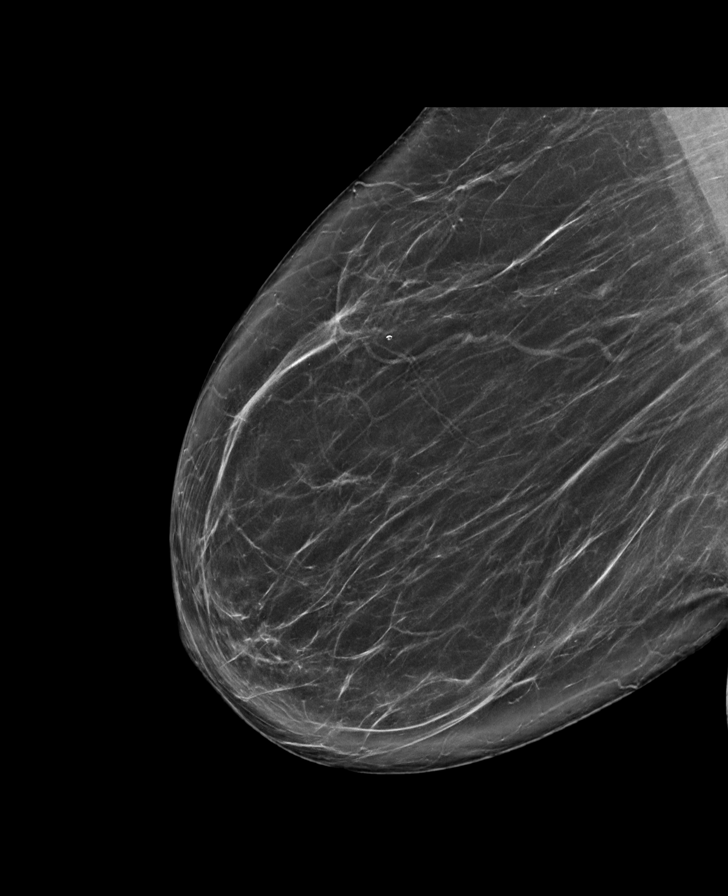

[R CC tomo · tomo slice 37/74.0]
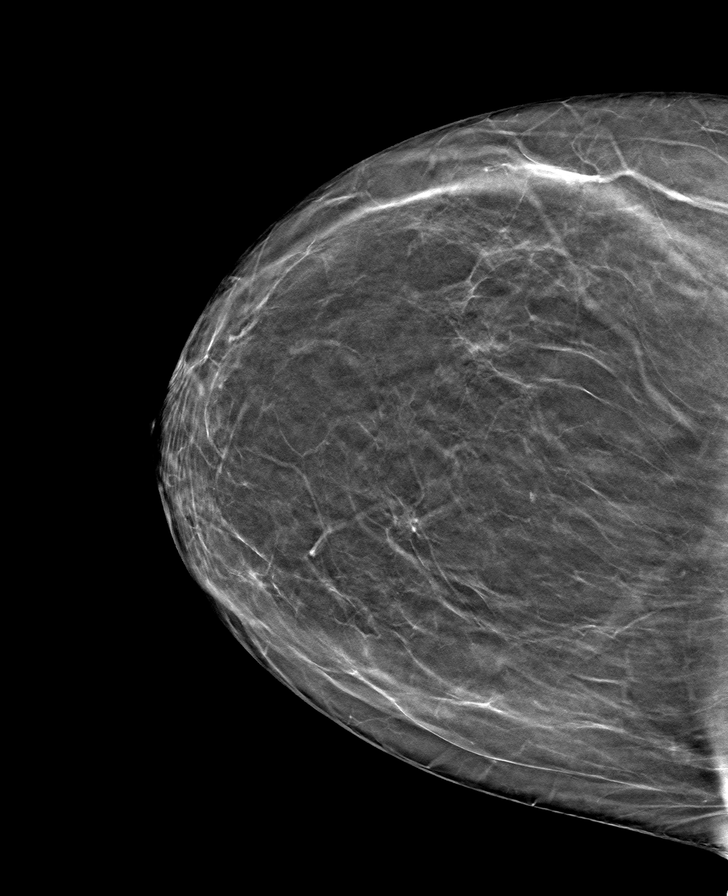

[L MLO tomo · tomo slice 39/76.0]
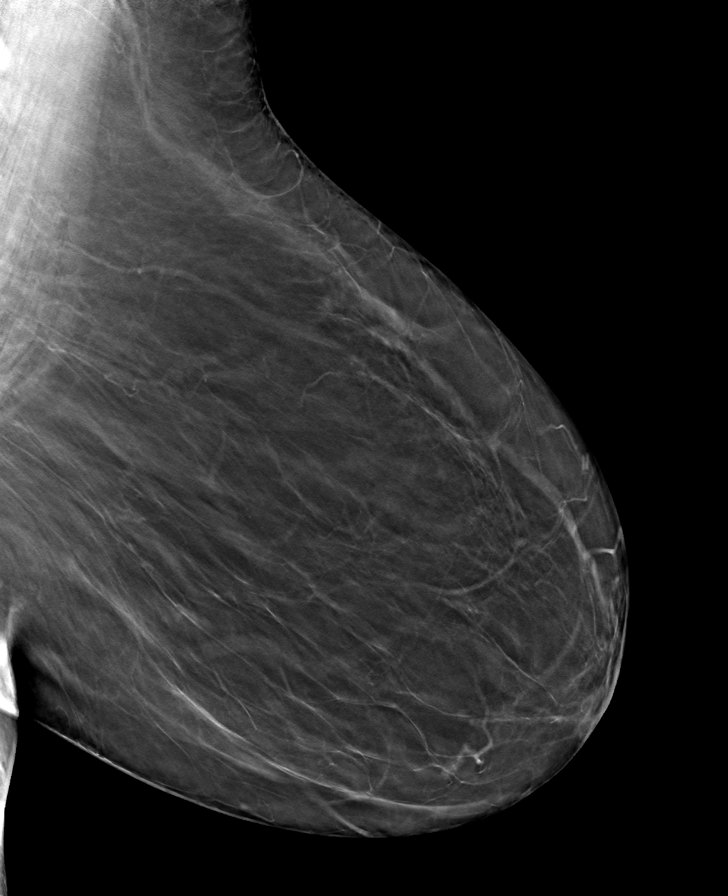

[L CC tomo · tomo slice 35/68.0]
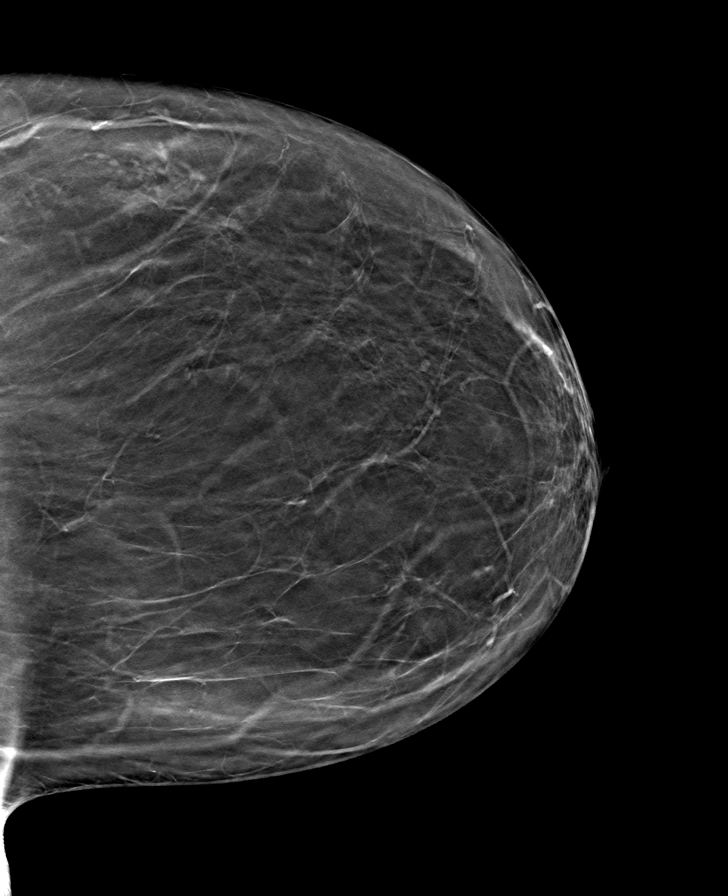

[R MLO tomo · tomo slice 44/87.0]
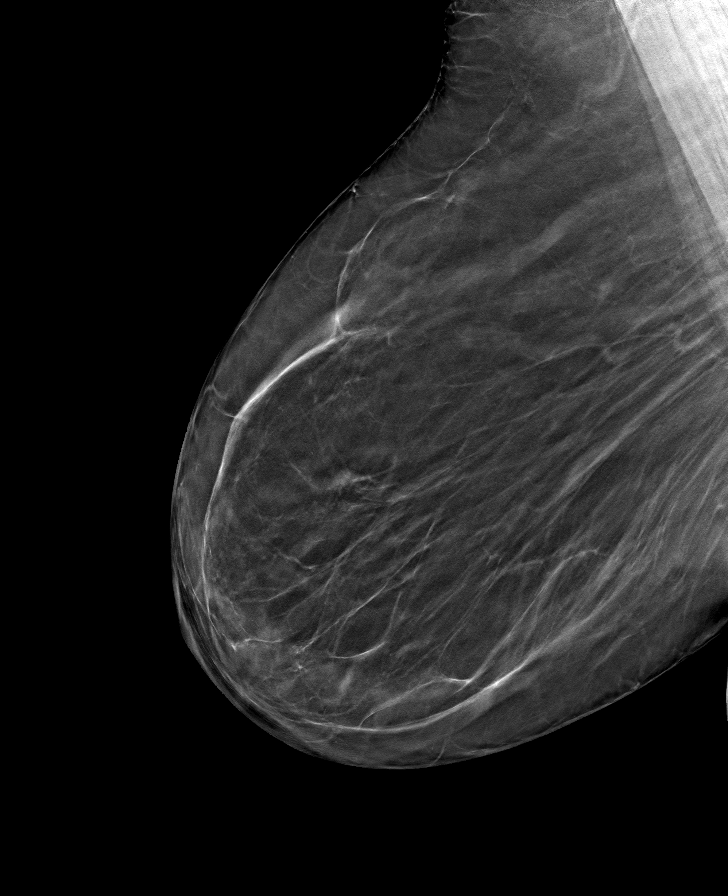

[8 of 24 positions shown; findings below may reference images not displayed]

FINDINGS: There are no findings suspicious for malignancy.
IMPRESSION: No mammographic evidence of malignancy. A result letter of this
screening mammogram will be mailed directly to the patient.

RECOMMENDATION:
Screening mammogram in one year. (Code:0E-3-N98)

BI-RADS CATEGORY  1: Negative.

## 2023-08-04 ENCOUNTER — Other Ambulatory Visit: Payer: Self-pay | Admitting: Internal Medicine

## 2023-08-04 DIAGNOSIS — E1122 Type 2 diabetes mellitus with diabetic chronic kidney disease: Secondary | ICD-10-CM

## 2023-08-09 ENCOUNTER — Telehealth: Payer: Self-pay

## 2023-08-09 NOTE — Telephone Encounter (Signed)
 PAP: PAP application for Entresto , [employer]) has been mailed to pt's home address on file. Will fax provider portion of application to provider's office when pt's portion is received.   Please note the providers portion of the application has been faxed to [facility] for Review

## 2023-08-13 ENCOUNTER — Other Ambulatory Visit (HOSPITAL_COMMUNITY): Payer: Self-pay

## 2023-08-27 ENCOUNTER — Ambulatory Visit (HOSPITAL_COMMUNITY)
Admission: RE | Admit: 2023-08-27 | Discharge: 2023-08-27 | Disposition: A | Discharge status: Home / Self Care | Payer: Medicare Other | Source: Ambulatory Visit | Attending: Cardiology

## 2023-08-27 ENCOUNTER — Ambulatory Visit (HOSPITAL_BASED_OUTPATIENT_CLINIC_OR_DEPARTMENT_OTHER)
Admission: RE | Admit: 2023-08-27 | Discharge: 2023-08-27 | Disposition: A | Discharge status: Home / Self Care | Payer: Medicare Other | Source: Ambulatory Visit | Attending: Cardiology | Admitting: Cardiology

## 2023-08-27 ENCOUNTER — Encounter (HOSPITAL_COMMUNITY): Payer: Self-pay | Admitting: Cardiology

## 2023-08-27 VITALS — BP 132/74 | HR 45 | Ht 62.0 in | Wt 167.8 lb

## 2023-08-27 DIAGNOSIS — E669 Obesity, unspecified: Secondary | ICD-10-CM | POA: Insufficient documentation

## 2023-08-27 DIAGNOSIS — E785 Hyperlipidemia, unspecified: Secondary | ICD-10-CM | POA: Diagnosis not present

## 2023-08-27 DIAGNOSIS — I082 Rheumatic disorders of both aortic and tricuspid valves: Secondary | ICD-10-CM | POA: Insufficient documentation

## 2023-08-27 DIAGNOSIS — Z79899 Other long term (current) drug therapy: Secondary | ICD-10-CM | POA: Insufficient documentation

## 2023-08-27 DIAGNOSIS — I5022 Chronic systolic (congestive) heart failure: Secondary | ICD-10-CM | POA: Insufficient documentation

## 2023-08-27 DIAGNOSIS — Z8673 Personal history of transient ischemic attack (TIA), and cerebral infarction without residual deficits: Secondary | ICD-10-CM | POA: Diagnosis not present

## 2023-08-27 DIAGNOSIS — I110 Hypertensive heart disease with heart failure: Secondary | ICD-10-CM | POA: Diagnosis not present

## 2023-08-27 DIAGNOSIS — E119 Type 2 diabetes mellitus without complications: Secondary | ICD-10-CM | POA: Diagnosis not present

## 2023-08-27 DIAGNOSIS — I4891 Unspecified atrial fibrillation: Secondary | ICD-10-CM | POA: Insufficient documentation

## 2023-08-27 DIAGNOSIS — Z7901 Long term (current) use of anticoagulants: Secondary | ICD-10-CM | POA: Insufficient documentation

## 2023-08-27 DIAGNOSIS — I5020 Unspecified systolic (congestive) heart failure: Secondary | ICD-10-CM | POA: Diagnosis not present

## 2023-08-27 DIAGNOSIS — Z7984 Long term (current) use of oral hypoglycemic drugs: Secondary | ICD-10-CM | POA: Insufficient documentation

## 2023-08-27 LAB — ECHOCARDIOGRAM COMPLETE
Area-P 1/2: 2.22 cm2
Calc EF: 53.1 %
Est EF: 45
S' Lateral: 4.5 cm
Single Plane A2C EF: 55.5 %
Single Plane A4C EF: 47.7 %

## 2023-08-27 LAB — BRAIN NATRIURETIC PEPTIDE: B Natriuretic Peptide: 200.1 pg/mL — ABNORMAL HIGH (ref 0.0–100.0)

## 2023-08-27 LAB — BASIC METABOLIC PANEL
Anion gap: 11 (ref 5–15)
BUN: 30 mg/dL — ABNORMAL HIGH (ref 8–23)
CO2: 22 mmol/L (ref 22–32)
Calcium: 9.4 mg/dL (ref 8.9–10.3)
Chloride: 112 mmol/L — ABNORMAL HIGH (ref 98–111)
Creatinine, Ser: 1.6 mg/dL — ABNORMAL HIGH (ref 0.44–1.00)
GFR, Estimated: 33 mL/min — ABNORMAL LOW (ref >60)
Glucose, Bld: 95 mg/dL (ref 70–99)
Potassium: 4.7 mmol/L (ref 3.5–5.1)
Sodium: 145 mmol/L (ref 135–145)

## 2023-08-27 NOTE — Progress Notes (Signed)
ADVANCED HEART FAILURE CLINIC NOTE  Referring Physician: Dorothyann Peng, MD  Primary Care: Dorothyann Peng, MD   HPI: Renee Bradley is a 78 y.o. female with type 2 diabetes, obesity, hypertension, atrial fibrillation with RVR in December 2023, right frontoparietal stroke in December 2023 and systolic heart failure (EF 30 to 35%) presenting today for follow up.  Her December 2023 when she presented with acute stroke, atrial fibrillation and newly diagnosed heart failure with reduced ejection fraction.  During her admission she was started on low-dose GDMT and since that time has undergone TEE cardioversion.  She was seen in [facility] clinic where GDMT was further uptitrated.  At her last appointment we continued to uptitrate GDMT.  Interval hx:  - doing very well from a functional standpoint.  - No recent hospitalizations.   Activity level/exercise tolerance:  NYHA II Paroxysmal noctural dyspnea:  No Chest pain/pressure:  No Orthostatic lightheadedness:  minimal Palpitations:  no Lower extremity edema:  No edema Presyncope/syncope:  no Cough:  no  Past Medical History:  Diagnosis Date   Asthma    Chest pressure 07/13/2012   Diabetes mellitus without complication (HCC)    DM (diabetes mellitus) (HCC) 07/13/2012   HTN (hypertension) 07/13/2012   Hypercholesterolemia    Hypertension    SOB (shortness of breath) 07/13/2012   Tachycardia, unspecified, "fluttering" 07/13/2012    Current Outpatient Medications  Medication Sig Dispense Refill   apixaban (ELIQUIS) 5 MG TABS tablet Take 1 tablet (5 mg total) by mouth 2 (two) times daily. 60 tablet 11   FARXIGA 10 MG TABS tablet TAKE 1 TABLET BY MOUTH EVERY DAY 30 tablet 2   furosemide (LASIX) 20 MG tablet Take 1 tablet (20 mg total) by mouth daily as needed. 90 tablet 3   Lancets (ONETOUCH DELICA PLUS LANCET33G) MISC      loratadine (CLARITIN) 10 MG tablet TAKE 1 TABLET BY MOUTH EVERY DAY AS NEEDED FOR ALLERGY 90 tablet 1   metoprolol  succinate (TOPROL-XL) 100 MG 24 hr tablet TAKE 1 TABLET BY MOUTH EVERY DAY 30 tablet 6   ONETOUCH VERIO test strip      potassium chloride SA (KLOR-CON M) 20 MEQ tablet Take 1 tablet (20 mEq total) by mouth daily as needed. ONLY TAKE POTASSIUM (1) TABLET WHEN YOU TAKE LASIX (FUROSEMIDE) 90 tablet 3   rosuvastatin (CRESTOR) 40 MG tablet TAKE 1 TABLET BY MOUTH EVERY DAY 90 tablet 2   sacubitril-valsartan (ENTRESTO) 24-26 MG Take 1 tablet by mouth 2 (two) times daily. 60 tablet 11   spironolactone (ALDACTONE) 25 MG tablet Take 0.5 tablets (12.5 mg total) by mouth daily. 45 tablet 3   No current facility-administered medications for this visit.    Allergies  Allergen Reactions   Janumet [Sitagliptin-Metformin Hcl] Shortness Of Breath   Januvia [Sitagliptin] Shortness Of Breath   Kombiglyze [Saxagliptin-Metformin Er] Shortness Of Breath   Sulfa Antibiotics Shortness Of Breath   Elemental Sulfur Itching      Social History   Socioeconomic History   Marital status: Married    Spouse name: Not on file   Number of children: 2   Years of education: Not on file   Highest education level: Not on file  Occupational History   Occupation: retired  Tobacco Use   Smoking status: Former    Current packs/day: 0.00    Types: Cigarettes    Start date: 05/12/1981    Quit date: 05/13/1983    Years since quitting: 40.3   Smokeless  tobacco: Never  Vaping Use   Vaping status: Never Used  Substance and Sexual Activity   Alcohol use: No   Drug use: No   Sexual activity: Yes  Other Topics Concern   Not on file  Social History Narrative   Not on file   Social Drivers of Health   Financial Resource Strain: Low Risk  (05/13/2023)   Overall Financial Resource Strain (CARDIA)    Difficulty of Paying Living Expenses: Not hard at all  Food Insecurity: No Food Insecurity (05/13/2023)   Hunger Vital Sign    Worried About Running Out of Food in the Last Year: Never true    Ran Out of Food in  the Last Year: Never true  Transportation Needs: No Transportation Needs (05/13/2023)   PRAPARE - Administrator, Civil Service (Medical): No    Lack of Transportation (Non-Medical): No  Physical Activity: Inactive (05/13/2023)   Exercise Vital Sign    Days of Exercise per Week: 0 days    Minutes of Exercise per Session: 0 min  Stress: No Stress Concern Present (05/13/2023)   [employer] of Occupational Health - Occupational Stress Questionnaire    Feeling of Stress : Not at all  Social Connections: Moderately Integrated (05/13/2023)   Social Connection and Isolation Panel [NHANES]    Frequency of Communication with Friends and Family: More than three times a week    Frequency of Social Gatherings with Friends and Family: Three times a week    Attends Religious Services: More than 4 times per year    Active Member of Clubs or Organizations: No    Attends Banker Meetings: Never    Marital Status: Married  Catering manager Violence: Not At Risk (05/13/2023)   Humiliation, Afraid, Rape, and Kick questionnaire    Fear of Current or Ex-Partner: No    Emotionally Abused: No    Physically Abused: No    Sexually Abused: No      Family History  Problem Relation Age of Onset   Heart attack Mother    Diabetes type II Mother    Stroke Mother    Alzheimer's disease Father    Diabetes type II Brother     PHYSICAL EXAM: There were no vitals filed for this visit. GENERAL: Well nourished, well developed, and in no apparent distress at rest.  HEENT: There is no scleral icterus.  The mucous membranes are pink and moist.   CHEST: There are no chest wall deformities. There is no chest wall tenderness. Respirations are unlabored.  Lungs- CTA B/L CARDIAC:  JVP: 7 cm          Normal rate with regular rhythm. no murmur.  Pulses are 2+ and symmetrical in upper and lower extremities. no edema.  ABDOMEN: Soft, non-tender, non-distended. There are normal bowel sounds.   EXTREMITIES: Warm and well perfused.  NEUROLOGIC: Patient is oriented x3 with no obvious focal neurologic deficits.  PSYCH: Patients affect is appropriate SKIN: Warm and dry; no lesions or wounds.     DATA REVIEW  ECG: 09/02/22: sinus bradycardia with pauses  ECHO: 12/23: LVEF 30-35%. Normal RV function.  10/13/22: LVEF 45%. Normal RV function. Trivial MR.   CATH:N/A   ASSESSMENT & PLAN:  Heart Failure with reduced EF Etiology of PP:IRJJOA tachyarrhythmia/afib mediated; plan for coronary CTA.   NYHA class / AHA Stage:III Volume status & Diuretics:  mildly hypervolemic on exam; take lasix 20mg  x 3 days with klorcon ; otherwise stop potassium.  Vasodilators: continue entresto 24/26mg  BID; can increase at follow up if BP remains elevated.  Beta-Blocker: continue toprol 100mg  daily; HR in the 50s but asymptomatic MRA: continue spironolactone to 25mg  daily. Repeat BMP/BNP today.  Cardiometabolic: continue farxiga 10mg  Devices therapies & Valvulopathies:not indicated Advanced therapies:not indicated  2. Atrial fibrillation -S/P TEE/DCCV -On Eliquis 5 BID. Reports compliance -EKG today NSR. Continue current dose metoprolol.    3. HTN - continue entresto 24/26mg  BID; slightly hypertensive today, however reports BP is higher at clinic visits.    4. HLD - continue crestor 40mg  daily - no myalgias or side effects.    5. DM II -Recent A1c 6.5% -continue farxiga 10mg .    6. CVA -Recent R frontoparietal junction infarct 12/23. Likely cardioembolic from AF. Seen by Neurology.  -Referred to Neuro follow-up -No residual deficits.  Graduate from [employer] clinic.   Cumi Sanagustin Advanced Heart Failure Mechanical Circulatory Support

## 2023-08-27 NOTE — Patient Instructions (Signed)
Medication Changes:  PLEASE GET FLONASE OVER THE COUNTER AND TRY THIS FOR ONE WEEK   Lab Work:  Labs done today, your results will be available in MyChart, we will contact you for abnormal readings.  Referrals:  YOU HAVE BEEN REFERRED TO GENERAL CARDIOLOGY THEY WILL REACH OUT TO YOU OR CALL TO ARRANGE THIS. PLEASE CALL us WITH ANY CONCERNS   Follow-Up in: YOU HAVE GRADUATED FROM THE HEART FAILURE CLINIC- PLEASE REACH OUT AND SCHEDULE A FOLLOW UP APPOINTMENT WITH GENERAL CARDIOLOGY   NUMBER IS [phone]   PLEASE LET us KNOW IF YOU NEED ANYTHING!  At the Advanced [facility] Failure Clinic, you and your health needs are our priority. We have a designated team specialized in the treatment of Heart Failure. This Care Team includes your primary Heart Failure Specialized Cardiologist (physician), Advanced Practice Providers (APPs- Physician Assistants and Nurse Practitioners), and Pharmacist who all work together to provide you with the care you need, when you need it.   You may see any of the following providers on your designated Care Team at your next follow up:  Dr. Arvilla Meres Dr. Marca Ancona Dr. [name] Dr. Theresia Bough Tonye Becket, NP Robbie Lis, [place] [facility] [place], [place] Brynda Peon, NP [place] Lee, NP Karle Plumber, PharmD   Please be sure to bring in all your medications bottles to every appointment.   Need to Contact us:  If you have any questions or concerns before your next appointment please send [place] a message through [facility] or call our office at [phone].    TO LEAVE A MESSAGE FOR THE NURSE SELECT OPTION 2, PLEASE LEAVE A MESSAGE INCLUDING: YOUR NAME DATE OF BIRTH CALL BACK NUMBER REASON FOR CALL**this is important as we prioritize the call backs  YOU WILL RECEIVE A CALL BACK THE SAME DAY AS LONG AS YOU CALL BEFORE 4:00 PM

## 2023-08-30 NOTE — Telephone Encounter (Signed)
Spoke with patient.  She did receive her application in the mail for [facility] and is in the process of filling it out and will get it mailed back in ASAP.

## 2023-09-07 ENCOUNTER — Encounter: Payer: Self-pay | Admitting: Cardiovascular Disease

## 2023-09-08 NOTE — Telephone Encounter (Signed)
 Spoke with pt. about 2025 Entresto  application, she will take with her to her 2/11 appt. Verified she has enough meds to last until then

## 2023-09-14 ENCOUNTER — Other Ambulatory Visit: Payer: Self-pay

## 2023-09-14 ENCOUNTER — Ambulatory Visit: Payer: Medicare Other | Admitting: Internal Medicine

## 2023-09-14 ENCOUNTER — Encounter: Payer: Self-pay | Admitting: Internal Medicine

## 2023-09-14 VITALS — BP 120/70 | HR 49 | Temp 98.1°F | Ht 62.0 in | Wt 168.0 lb

## 2023-09-14 DIAGNOSIS — E1122 Type 2 diabetes mellitus with diabetic chronic kidney disease: Secondary | ICD-10-CM

## 2023-09-14 DIAGNOSIS — E7800 Pure hypercholesterolemia, unspecified: Secondary | ICD-10-CM | POA: Diagnosis not present

## 2023-09-14 DIAGNOSIS — Z6830 Body mass index (BMI) 30.0-30.9, adult: Secondary | ICD-10-CM | POA: Diagnosis not present

## 2023-09-14 DIAGNOSIS — Z79899 Other long term (current) drug therapy: Secondary | ICD-10-CM | POA: Diagnosis not present

## 2023-09-14 DIAGNOSIS — I5020 Unspecified systolic (congestive) heart failure: Secondary | ICD-10-CM

## 2023-09-14 DIAGNOSIS — E66811 Obesity, class 1: Secondary | ICD-10-CM | POA: Diagnosis not present

## 2023-09-14 DIAGNOSIS — E6609 Other obesity due to excess calories: Secondary | ICD-10-CM | POA: Diagnosis not present

## 2023-09-14 DIAGNOSIS — I130 Hypertensive heart and chronic kidney disease with heart failure and stage 1 through stage 4 chronic kidney disease, or unspecified chronic kidney disease: Secondary | ICD-10-CM

## 2023-09-14 DIAGNOSIS — N182 Chronic kidney disease, stage 2 (mild): Secondary | ICD-10-CM

## 2023-09-14 MED ORDER — ONETOUCH VERIO VI STRP: ORAL_STRIP | 2 refills | Status: DC

## 2023-09-14 MED ORDER — ONETOUCH DELICA PLUS LANCET33G MISC: 3 refills | Status: DC

## 2023-09-14 NOTE — Patient Instructions (Signed)

## 2023-09-14 NOTE — Progress Notes (Signed)
I,Jameka J Llittleton, CMA,acting as a Neurosurgeon for [name], MD.,have documented all relevant documentation on the behalf of [name], MD,as directed by  [name], MD while in the presence of [name], MD.  Subjective:  Patient ID: Renee Bradley , female    DOB: Aug 03, 1946 , 78 y.o.   MRN: [id number]  Chief Complaint  Patient presents with   Hypertension   Diabetes    HPI  She presents today for diabetes and htn check. She reports compliance with meds.  She denies having any chest pain and shortness of breath.   She has no specific concerns or complaints at this time.    Hypertension This is a chronic problem. The current episode started more than 1 year ago. The problem has been gradually improving since onset. The problem is uncontrolled. Pertinent negatives include no blurred vision or chest pain. Risk factors for coronary artery disease include diabetes mellitus, dyslipidemia, post-menopausal state and sedentary lifestyle. Past treatments include diuretics and angiotensin blockers. The current treatment provides moderate improvement. Hypertensive end-organ damage includes kidney disease.  Diabetes She presents for her follow-up diabetic visit. She has type 2 diabetes mellitus. Her disease course has been stable. There are no hypoglycemic associated symptoms. There are no diabetic associated symptoms. Pertinent negatives for diabetes include no blurred vision, no chest pain, no polydipsia, no polyphagia and no polyuria. There are no hypoglycemic complications. Diabetic complications include nephropathy. Risk factors for coronary artery disease include diabetes mellitus, dyslipidemia, hypertension, post-menopausal, sedentary lifestyle and obesity. Current diabetic treatment includes oral agent (monotherapy). She is compliant with treatment some of the time. Her weight is fluctuating minimally. She is following a diabetic diet. She participates in exercise  intermittently. An ACE inhibitor/angiotensin II receptor blocker is not being taken.     Past Medical History:  Diagnosis Date   Asthma    Chest pressure 07/13/2012   Diabetes mellitus without complication (HCC)    DM (diabetes mellitus) (HCC) 07/13/2012   HTN (hypertension) 07/13/2012   Hypercholesterolemia    Hypertension    SOB (shortness of breath) 07/13/2012   Tachycardia, unspecified, "fluttering" 07/13/2012     Family History  Problem Relation Age of Onset   Heart attack Mother    Diabetes type II Mother    Stroke Mother    Alzheimer's disease Father    Diabetes type II Brother      Current Outpatient Medications:    apixaban (ELIQUIS) 5 MG TABS tablet, Take 1 tablet (5 mg total) by mouth 2 (two) times daily., Disp: 60 tablet, Rfl: 11   FARXIGA 10 MG TABS tablet, TAKE 1 TABLET BY MOUTH EVERY DAY, Disp: 30 tablet, Rfl: 2   furosemide (LASIX) 20 MG tablet, Take 1 tablet (20 mg total) by mouth daily as needed., Disp: 90 tablet, Rfl: 3   loratadine (CLARITIN) 10 MG tablet, TAKE 1 TABLET BY MOUTH EVERY DAY AS NEEDED FOR ALLERGY, Disp: 90 tablet, Rfl: 1   potassium chloride SA (KLOR-CON M) 20 MEQ tablet, Take 1 tablet (20 mEq total) by mouth daily as needed. ONLY TAKE POTASSIUM (1) TABLET WHEN YOU TAKE LASIX (FUROSEMIDE), Disp: 90 tablet, Rfl: 3   rosuvastatin (CRESTOR) 40 MG tablet, TAKE 1 TABLET BY MOUTH EVERY DAY, Disp: 90 tablet, Rfl: 2   sacubitril-valsartan (ENTRESTO) 24-26 MG, Take 1 tablet by mouth 2 (two) times daily., Disp: 60 tablet, Rfl: 11   Lancets (ONETOUCH DELICA PLUS LANCET33G) MISC, Use as directed to check blood sugars  once daily E11.65, Disp: 100 each, Rfl: 3   metoprolol succinate (TOPROL-XL) 100 MG 24 hr tablet, TAKE 1 TABLET DAILY, Disp: 90 tablet, Rfl: 3   ONETOUCH VERIO test strip, Use as directed to check blood sugars once daily E11.65, Disp: 100 each, Rfl: 2   spironolactone (ALDACTONE) 25 MG tablet, TAKE 1/2 TABLET(12.5MG      TOTAL) DAILY, Disp:  45 tablet, Rfl: 3   Allergies  Allergen Reactions   Janumet [Sitagliptin-Metformin Hcl] Shortness Of Breath   Januvia [Sitagliptin] Shortness Of Breath   Kombiglyze [Saxagliptin-Metformin Er] Shortness Of Breath   Sulfa Antibiotics Shortness Of Breath   Elemental Sulfur Itching     Review of Systems  Constitutional: Negative.   Eyes: Negative.  Negative for blurred vision.  Respiratory: Negative.    Cardiovascular: Negative.  Negative for chest pain.  Gastrointestinal: Negative.   Endocrine: Negative for polydipsia, polyphagia and polyuria.  Musculoskeletal: Negative.   Skin: Negative.   Psychiatric/Behavioral: Negative.       Today's Vitals   09/14/23 1557  BP: 120/70  Pulse: (!) 49  Temp: 98.1 F (36.7 C)  TempSrc: Oral  Weight: 168 lb (76.2 kg)  Height: 5\' 2"  (1.575 m)  PainSc: 0-No pain   Body mass index is 30.73 kg/m.  Wt Readings from Last 3 Encounters:  09/14/23 168 lb (76.2 kg)  08/27/23 167 lb 12.8 oz (76.1 kg)  05/14/23 167 lb (75.8 kg)    The ASCVD Risk score (Arnett DK, et al., 2019) failed to calculate for the following reasons:   Risk score cannot be calculated because patient has a medical history suggesting prior/existing ASCVD  Objective:  Physical Exam Vitals and nursing note reviewed.  Constitutional:      Appearance: Normal appearance.  HENT:     Head: Normocephalic and atraumatic.  Eyes:     Extraocular Movements: Extraocular movements intact.  Cardiovascular:     Rate and Rhythm: Normal rate and regular rhythm.     Heart sounds: Normal heart sounds.  Pulmonary:     Effort: Pulmonary effort is normal.     Breath sounds: Normal breath sounds.  Musculoskeletal:     Cervical back: Normal range of motion.  Skin:    General: Skin is warm.  Neurological:     General: No focal deficit present.     Mental Status: She is alert.  Psychiatric:        Mood and Affect: Mood normal.        Behavior: Behavior normal.         Assessment  And Plan:  Type 2 diabetes mellitus with stage 2 chronic kidney disease, without long-term current use of insulin (HCC) Assessment & Plan: Chronic, she will continue with [place] with MWF dosing.  She is encouraged to follow current dietary recommendations. She will follow up in 3-4 months for re-evaluation.   Orders: -     Hemoglobin A1c -     CBC  Hypertensive heart and renal disease with renal failure, stage 1 through stage 4 or unspecified chronic kidney disease, with heart failure (HCC) Assessment & Plan: Chronic, well controlled. She is encouraged to follow low sodium diet. She will continue with furosemide 20mg  daily, metoprolol XL 100mg  daily, spironolactone 25mg  1/2 tab daily and Entresto 24/26mg  twice daily. Importance of dietary/medication compliance was stressed to the patient. Most recent BMP results reviewed in detail.    HFrEF (heart failure with reduced ejection fraction) (HCC) Assessment & Plan: Chronic, appears to be euvolemic at this  time. She will continue with metoprolol XL 100mg , spironolactone 25mg , Farxiga 10mg , furosemide 20mg  and Entresto 24/26mg  as per Cardiology.    Pure hypercholesterolemia Assessment & Plan: Chronic, LDL goal is less than 70.  She will continue with rosuvastatin 40mg  daily. Encouraged to follow heart healthy diet.    Class 1 obesity due to excess calories with serious comorbidity and body mass index (BMI) of 30.0 to 30.9 in adult Assessment & Plan: Her BMI is acceptable for her demographic.  She is advised to aim for at least 150 minutes of exercise per week.    Drug therapy -     CBC    Return in 4 months (on 01/12/2024) for controlled DM check-4 months.  Patient was given opportunity to ask questions. Patient verbalized understanding of the plan and was able to repeat key elements of the plan. All questions were answered to their satisfaction.    I, [name], MD, have reviewed all documentation for this visit. The  documentation on 09/14/23 for the exam, diagnosis, procedures, and orders are all accurate and complete.   IF YOU HAVE BEEN REFERRED TO A SPECIALIST, IT MAY TAKE 1-2 WEEKS TO SCHEDULE/PROCESS THE REFERRAL. IF YOU HAVE NOT HEARD FROM US/SPECIALIST IN TWO WEEKS, PLEASE GIVE [place] A CALL AT [phone].

## 2023-09-15 LAB — CBC
Hematocrit: 40 % (ref 34.0–46.6)
Hemoglobin: 12.6 g/dL (ref 11.1–15.9)
MCH: 30.1 pg (ref 26.6–33.0)
MCHC: 31.5 g/dL (ref 31.5–35.7)
MCV: 96 fL (ref 79–97)
Platelets: 163 10*3/uL (ref 150–450)
RBC: 4.19 x10E6/uL (ref 3.77–5.28)
RDW: 13.1 % (ref 11.7–15.4)
WBC: 3.5 10*3/uL (ref 3.4–10.8)

## 2023-09-15 LAB — HEMOGLOBIN A1C
Est. average glucose Bld gHb Est-mCnc: 126 mg/dL
Hgb A1c MFr Bld: 6 % — ABNORMAL HIGH (ref 4.8–5.6)

## 2023-09-15 NOTE — Telephone Encounter (Signed)
PAP: Application for Sherryll Burger has been submitted to [employer], via fax

## 2023-09-19 ENCOUNTER — Encounter: Payer: Self-pay | Admitting: Internal Medicine

## 2023-09-19 NOTE — Assessment & Plan Note (Addendum)
Her BMI is acceptable for her demographic.  She is advised to aim for at least 150 minutes of exercise per week.

## 2023-09-19 NOTE — Assessment & Plan Note (Signed)
Chronic, she will continue with [place] with MWF dosing.  She is encouraged to follow current dietary recommendations. She will follow up in 3-4 months for re-evaluation.

## 2023-09-19 NOTE — Assessment & Plan Note (Addendum)
Chronic, well controlled. She is encouraged to follow low sodium diet. She will continue with furosemide 20mg  daily, metoprolol XL 100mg  daily, spironolactone 25mg  1/2 tab daily and Entresto 24/26mg  twice daily. Importance of dietary/medication compliance was stressed to the patient. Most recent BMP results reviewed in detail.

## 2023-09-19 NOTE — Assessment & Plan Note (Signed)
Chronic, LDL goal is less than 70.  She will continue with rosuvastatin 40mg  daily. Encouraged to follow heart healthy diet.

## 2023-09-20 ENCOUNTER — Other Ambulatory Visit (HOSPITAL_COMMUNITY): Payer: Self-pay | Admitting: Cardiology

## 2023-09-20 ENCOUNTER — Ambulatory Visit: Payer: Medicare Other | Admitting: Cardiovascular Disease

## 2023-09-20 ENCOUNTER — Other Ambulatory Visit (HOSPITAL_COMMUNITY): Payer: Self-pay | Admitting: Physician Assistant

## 2023-09-22 NOTE — Assessment & Plan Note (Signed)
Chronic, appears to be euvolemic at this time. She will continue with metoprolol XL 100mg , spironolactone 25mg , Farxiga 10mg , furosemide 20mg  and Entresto 24/26mg  as per Cardiology.

## 2023-09-27 ENCOUNTER — Other Ambulatory Visit (HOSPITAL_COMMUNITY): Payer: Self-pay

## 2023-09-27 NOTE — Telephone Encounter (Signed)
 PAP: Patient has been denied for patient assistance by [employer] due to patient above income threshold for program

## 2023-10-15 ENCOUNTER — Ambulatory Visit: Payer: Medicare Other | Attending: Cardiovascular Disease | Admitting: Cardiovascular Disease

## 2023-10-15 ENCOUNTER — Encounter: Payer: Self-pay | Admitting: Cardiovascular Disease

## 2023-10-15 VITALS — BP 140/72 | HR 93 | Ht 62.0 in | Wt 169.0 lb

## 2023-10-15 DIAGNOSIS — I10 Essential (primary) hypertension: Secondary | ICD-10-CM | POA: Insufficient documentation

## 2023-10-15 DIAGNOSIS — I4891 Unspecified atrial fibrillation: Secondary | ICD-10-CM | POA: Insufficient documentation

## 2023-10-15 DIAGNOSIS — E782 Mixed hyperlipidemia: Secondary | ICD-10-CM | POA: Diagnosis not present

## 2023-10-15 DIAGNOSIS — I5020 Unspecified systolic (congestive) heart failure: Secondary | ICD-10-CM | POA: Insufficient documentation

## 2023-10-15 NOTE — Patient Instructions (Signed)
 Medication Instructions:  Your physician recommends that you continue on your current medications as directed. Please refer to the Current Medication list given to you today.  *If you need a refill on your cardiac medications before your next appointment, please call your pharmacy*   Follow-Up: At [facility], you and your health needs are our priority.  As part of our continuing mission to provide you with exceptional heart care, we have created designated Provider Care Teams.  These Care Teams include your primary Cardiologist (physician) and Advanced Practice Providers (APPs -  Physician Assistants and Nurse Practitioners) who all work together to provide you with the care you need, when you need it.  We recommend signing up for the patient portal called "MyChart".  Sign up information is provided on this After Visit Summary.  MyChart is used to connect with patients for Virtual Visits (Telemedicine).  Patients are able to view lab/test results, encounter notes, upcoming appointments, etc.  Non-urgent messages can be sent to your provider as well.   To learn more about what you can do with MyChart, go to [email or web address].    Your next appointment:   6 month(s)  Provider:   Marjie Skiff, PA-C, Robet Leu, PA-C, Azalee Course, PA-C, Bernadene Person, NP, or Reather Littler, NP       Then, Nanetta Batty, MD will plan to see you again in 12 month(s).    Other Instructions   1st Floor: - Lobby - Registration  - Pharmacy  - Lab - Cafe  2nd Floor: - PV Lab - Diagnostic Testing (echo, CT, nuclear med)  3rd Floor: - Vacant  4th Floor: - TCTS (cardiothoracic surgery) - AFib Clinic - Structural Heart Clinic - Vascular Surgery  - Vascular Ultrasound  5th Floor: - HeartCare Cardiology (general and EP) - Clinical Pharmacy for coumadin, hypertension, lipid, weight-loss medications, and med management appointments    Valet parking services will be available as well.

## 2023-10-15 NOTE — Assessment & Plan Note (Signed)
 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  She ultimately ultimately underwent TEE guided DC cardioversion by Dr.Sabharwal and has remained in sinus rhythm on Eliquis oral anticoagulation.

## 2023-10-15 NOTE — Progress Notes (Signed)
 10/15/2023 Renee Bradley   May 22, 1946  [id number]  Primary Physician Dorothyann Peng, MD Primary Cardiologist: Runell Gess MD Nicholes Calamity, [place]  HPI:  Renee Bradley is a 78 y.o.   moderately overweight married [place] American female, mother of 2, grandmother of 2 grandchildren ho I last saw in the office 09/07/2018... She was hospitalized, July 15, 2012, with shortness of breath. She had had lower extremity Dopplers performed at Dr. Mathews Robinsons office just prior to that. CT angiogram was negative. She was ultimately discharged home. Her risk factors for ischemic heart disease include remote tobacco abuse, having quit 35 years ago, and treated diabetes, hypertension, and hyperlipidemia. There is no family history of heart disease. She has never had a heart attack or stroke. Since her admission, she denies chest pain or shortness of breath. Her surgical history is remarkable for a remote hysterectomy in 1989. She wore an event monitor, which showed sinus rhythm, and a Lexiscan Myoview showed mild inferolateral-apical ischemia. Since I saw her in the office almost 3 years ago she is remained asymptomatic.  She denies chest pain or shortness of breath.  She does have a little pain around her ankle and has some sensitivity to palpation but no edema.  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  She is admitted to the hospital with embolic stroke 07/27/2022 and was found to be in A-fib.  Her EF was reduced at that time by 2D echo.  She underwent TEE guided DC cardioversion by Dr.Sabharwal on 09/02/2022.  Her most recent 2D echo performed 08/27/2023 revealed EF of 45%.  She is on GDMT.  She denies chest pain or shortness of breath.   Current Meds  Medication Sig   apixaban (ELIQUIS) 5 MG TABS tablet Take 1 tablet (5 mg total) by mouth 2 (two) times daily.   FARXIGA 10 MG TABS tablet TAKE 1 TABLET BY MOUTH EVERY DAY   furosemide (LASIX) 20 MG tablet Take  1 tablet (20 mg total) by mouth daily as needed.   Lancets (ONETOUCH DELICA PLUS LANCET33G) MISC Use as directed to check blood sugars once daily E11.65   loratadine (CLARITIN) 10 MG tablet TAKE 1 TABLET BY MOUTH EVERY DAY AS NEEDED FOR ALLERGY   metoprolol succinate (TOPROL-XL) 100 MG 24 hr tablet TAKE 1 TABLET DAILY   ONETOUCH VERIO test strip Use as directed to check blood sugars once daily E11.65   potassium chloride SA (KLOR-CON M) 20 MEQ tablet Take 1 tablet (20 mEq total) by mouth daily as needed. ONLY TAKE POTASSIUM (1) TABLET WHEN YOU TAKE LASIX (FUROSEMIDE)   rosuvastatin (CRESTOR) 40 MG tablet TAKE 1 TABLET BY MOUTH EVERY DAY   sacubitril-valsartan (ENTRESTO) 24-26 MG Take 1 tablet by mouth 2 (two) times daily.   spironolactone (ALDACTONE) 25 MG tablet TAKE 1/2 TABLET(12.5MG      TOTAL) DAILY     Allergies  Allergen Reactions   Janumet [Sitagliptin-Metformin Hcl] Shortness Of Breath   Januvia [Sitagliptin] Shortness Of Breath   Kombiglyze [Saxagliptin-Metformin Er] Shortness Of Breath   Sulfa Antibiotics Shortness Of Breath   Elemental Sulfur Itching    Social History   Socioeconomic History   Marital status: Married    Spouse name: Not on file   Number of children: 2   Years of education: Not on file   Highest education level: Not on file  Occupational History   Occupation: retired  Tobacco Use   Smoking status: Former  Current packs/day: 0.00    Types: Cigarettes    Start date: 05/12/1981    Quit date: 05/13/1983    Years since quitting: 40.4   Smokeless tobacco: Never  Vaping Use   Vaping status: Never Used  Substance and Sexual Activity   Alcohol use: No   Drug use: No   Sexual activity: Yes  Other Topics Concern   Not on file  Social History Narrative   Not on file   Social Drivers of Health   Financial Resource Strain: Low Risk  (05/13/2023)   Overall Financial Resource Strain (CARDIA)    Difficulty of Paying Living Expenses: Not hard at  all  Food Insecurity: No Food Insecurity (05/13/2023)   Hunger Vital Sign    Worried About Running Out of Food in the Last Year: Never true    Ran Out of Food in the Last Year: Never true  Transportation Needs: No Transportation Needs (05/13/2023)   PRAPARE - Administrator, Civil Service (Medical): No    Lack of Transportation (Non-Medical): No  Physical Activity: Inactive (05/13/2023)   Exercise Vital Sign    Days of Exercise per Week: 0 days    Minutes of Exercise per Session: 0 min  Stress: No Stress Concern Present (05/13/2023)   [employer] of Occupational Health - Occupational Stress Questionnaire    Feeling of Stress : Not at all  Social Connections: Moderately Integrated (05/13/2023)   Social Connection and Isolation Panel [NHANES]    Frequency of Communication with Friends and Family: More than three times a week    Frequency of Social Gatherings with Friends and Family: Three times a week    Attends Religious Services: More than 4 times per year    Active Member of Clubs or Organizations: No    Attends Banker Meetings: Never    Marital Status: Married  Catering manager Violence: Not At Risk (05/13/2023)   Humiliation, Afraid, Rape, and Kick questionnaire    Fear of Current or Ex-Partner: No    Emotionally Abused: No    Physically Abused: No    Sexually Abused: No     Review of Systems: General: negative for chills, fever, night sweats or weight changes.  Cardiovascular: negative for chest pain, dyspnea on exertion, edema, orthopnea, palpitations, paroxysmal nocturnal dyspnea or shortness of breath Dermatological: negative for rash Respiratory: negative for cough or wheezing Urologic: negative for hematuria Abdominal: negative for nausea, vomiting, diarrhea, bright red blood per rectum, melena, or hematemesis Neurologic: negative for visual changes, syncope, or dizziness All other systems reviewed and are otherwise negative except as  noted above.    Blood pressure (!) 140/72, pulse 93, height 5\' 2"  (1.575 m), weight 169 lb (76.7 kg), SpO2 96%.  General appearance: alert and no distress Neck: no adenopathy, no carotid bruit, no JVD, supple, symmetrical, trachea midline, and thyroid not enlarged, symmetric, no tenderness/mass/nodules Lungs: clear to auscultation bilaterally Heart: regular rate and rhythm, S1, S2 normal, no murmur, click, rub or gallop Extremities: extremities normal, atraumatic, no cyanosis or edema Pulses: 2+ and symmetric Skin: Skin color, texture, turgor normal. No rashes or lesions Neurologic: Grossly normal  EKG not performed today      ASSESSMENT AND PLAN:   HTN (hypertension) History of essential hypertension her blood pressure measured today at 140/72.  She is on metoprolol and Entresto as well as Aldactone.  Hyperlipidemia History of hyperlipidemia on high-dose rosuvastatin with lipid profile performed 05/13/2023 revealing total cholesterol of 157, LDL 76  and HDL 67.  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  She ultimately ultimately underwent TEE guided DC cardioversion by Dr.Sabharwal and has remained in sinus rhythm on Eliquis oral anticoagulation.  HFrEF (heart failure with reduced ejection fraction) (HCC) Most recent 2D echocardiogram performed 08/27/2023 revealed EF of 45% with grade 1 diastolic dysfunction.  There was moderate left atrial dilatation.  There is no significant valvular abnormality.  She is on GDMT.     Runell Gess MD FACP,FACC,FAHA, [facility] 10/15/2023 4:33 PM

## 2023-10-15 NOTE — Assessment & Plan Note (Signed)
 Most recent 2D echocardiogram performed 08/27/2023 revealed EF of 45% with grade 1 diastolic dysfunction.  There was moderate left atrial dilatation.  There is no significant valvular abnormality.  She is on GDMT.

## 2023-10-15 NOTE — Assessment & Plan Note (Signed)
 History of hyperlipidemia on high-dose rosuvastatin with lipid profile performed 05/13/2023 revealing total cholesterol of 157, LDL 76 and HDL 67.

## 2023-10-15 NOTE — Assessment & Plan Note (Signed)
 History of essential hypertension her blood pressure measured today at 140/72.  She is on metoprolol and Entresto as well as Aldactone.

## 2023-10-21 ENCOUNTER — Other Ambulatory Visit (HOSPITAL_COMMUNITY): Payer: Self-pay | Admitting: Physician Assistant

## 2024-01-13 ENCOUNTER — Ambulatory Visit: Payer: Medicare Other | Admitting: Internal Medicine

## 2024-01-17 ENCOUNTER — Ambulatory Visit: Admitting: Internal Medicine

## 2024-01-17 VITALS — BP 124/82 | HR 85 | Temp 98.4°F | Ht 62.0 in | Wt 165.2 lb

## 2024-01-17 DIAGNOSIS — I5020 Unspecified systolic (congestive) heart failure: Secondary | ICD-10-CM | POA: Diagnosis not present

## 2024-01-17 DIAGNOSIS — E1122 Type 2 diabetes mellitus with diabetic chronic kidney disease: Secondary | ICD-10-CM | POA: Diagnosis not present

## 2024-01-17 DIAGNOSIS — Z1231 Encounter for screening mammogram for malignant neoplasm of breast: Secondary | ICD-10-CM

## 2024-01-17 DIAGNOSIS — N1831 Chronic kidney disease, stage 3a: Secondary | ICD-10-CM | POA: Diagnosis not present

## 2024-01-17 DIAGNOSIS — E2839 Other primary ovarian failure: Secondary | ICD-10-CM | POA: Diagnosis not present

## 2024-01-17 DIAGNOSIS — I130 Hypertensive heart and chronic kidney disease with heart failure and stage 1 through stage 4 chronic kidney disease, or unspecified chronic kidney disease: Secondary | ICD-10-CM | POA: Diagnosis not present

## 2024-01-17 DIAGNOSIS — Z6830 Body mass index (BMI) 30.0-30.9, adult: Secondary | ICD-10-CM | POA: Diagnosis not present

## 2024-01-17 DIAGNOSIS — E6609 Other obesity due to excess calories: Secondary | ICD-10-CM

## 2024-01-17 DIAGNOSIS — E7800 Pure hypercholesterolemia, unspecified: Secondary | ICD-10-CM | POA: Diagnosis not present

## 2024-01-17 DIAGNOSIS — E66811 Obesity, class 1: Secondary | ICD-10-CM

## 2024-01-17 MED ORDER — DAPAGLIFLOZIN PROPANEDIOL 10 MG PO TABS: 10.0000 mg | ORAL_TABLET | Freq: Every day | ORAL | 2 refills | Status: AC

## 2024-01-17 MED ORDER — ONETOUCH VERIO VI STRP: ORAL_STRIP | 2 refills | Status: AC

## 2024-01-17 MED ORDER — ONETOUCH DELICA PLUS LANCET33G MISC: 3 refills | Status: AC

## 2024-01-17 NOTE — Progress Notes (Signed)
 I,Renee Bradley, CMA,acting as a Neurosurgeon for Renee LOISE Slocumb, MD.,have documented all relevant documentation on the behalf of Renee LOISE Slocumb, MD,as directed by  Renee LOISE Slocumb, MD while in the presence of Renee LOISE Slocumb, MD.  Subjective:  Patient ID: Renee Bradley , female    DOB: 08-19-1945 , 78 y.o.   MRN: [id number]  Chief Complaint  Patient presents with   Diabetes    Patient presents today for bp, dm & cholesterol follow up. She reports compliance with medications. Denies headache, chest pain & sob. She states noticing she is losing weight. She also does not have that much of a appetite. She eats small portions twice daily.  She will call to make appt for dm eye exam.    Hypertension   Hyperlipidemia    HPI Discussed the use of AI scribe software for clinical note transcription with the patient, who gave verbal consent to proceed.  History of Present Illness Renee Bradley is a 78 year old female with diabetes and hypertension who presents for a diabetes and blood pressure check.  She manages her blood sugar levels by consuming very small portions, which sometimes results in dizziness and a lack of satiety. Her blood sugar can rise to 160 mg/dL or higher postprandially, and she monitors it closely. Her typical breakfast includes a bagel, bacon, and a boiled egg, and she snacks on peanut butter crackers or nuts in the afternoon. She aims to have dinner by 6 PM and has observed that certain foods like hot dogs and salmon do not significantly elevate her blood sugar, while others like chicken can cause it to rise to 180 mg/dL. She documents her blood sugar levels and occasionally stays up late to monitor them until they decrease.  Her current medications include Eliquis  twice a day, Farxiga  10 mg daily, Lasix  20 mg as needed, Toprol  100 mg daily, potassium, rosuvastatin  40 mg, Entresto  twice a day, and spironolactone .  She feels that her medication regimen contributes to sleepiness  and decreased functionality.  She has a history of chronic kidney disease and recalls a past hospitalization where her current medications were initiated. She is vigilant about her health and experiences anxiety regarding her condition, noting significant dietary adjustments since starting her medications. She is concerned about running out of lancets and test strips due to frequent blood sugar monitoring.    Diabetes She presents for her follow-up diabetic visit. She has type 2 diabetes mellitus. Her disease course has been stable. There are no hypoglycemic associated symptoms. There are no diabetic associated symptoms. Pertinent negatives for diabetes include no blurred vision, no chest pain, no polydipsia, no polyphagia and no polyuria. There are no hypoglycemic complications. Diabetic complications include nephropathy. Risk factors for coronary artery disease include diabetes mellitus, dyslipidemia, hypertension, post-menopausal, sedentary lifestyle and obesity. She is compliant with treatment some of the time. Her weight is fluctuating minimally. She is following a diabetic diet. She participates in exercise intermittently. An ACE inhibitor/angiotensin II receptor blocker is not being taken.  Hypertension This is a chronic problem. The current episode started more than 1 year ago. The problem has been gradually improving since onset. The problem is uncontrolled. Pertinent negatives include no blurred vision or chest pain. The current treatment provides moderate improvement. Hypertensive end-organ damage includes kidney disease.     Past Medical History:  Diagnosis Date   Asthma    Chest pressure 07/13/2012   Diabetes mellitus without complication (HCC)    DM (diabetes  mellitus) (HCC) 07/13/2012   HTN (hypertension) 07/13/2012   Hypercholesterolemia    Hypertension    SOB (shortness of breath) 07/13/2012   Tachycardia, unspecified, fluttering 07/13/2012     Family History  Problem  Relation Age of Onset   Heart attack Mother    Diabetes type II Mother    Stroke Mother    Alzheimer's disease Father    Diabetes type II Brother      Current Outpatient Medications:    apixaban  (ELIQUIS ) 5 MG TABS tablet, Take 1 tablet (5 mg total) by mouth 2 (two) times daily., Disp: 60 tablet, Rfl: 11   furosemide  (LASIX ) 20 MG tablet, Take 1 tablet (20 mg total) by mouth daily as needed., Disp: 90 tablet, Rfl: 3   loratadine  (CLARITIN ) 10 MG tablet, TAKE 1 TABLET BY MOUTH EVERY DAY AS NEEDED FOR ALLERGY, Disp: 90 tablet, Rfl: 1   metoprolol  succinate (TOPROL -XL) 100 MG 24 hr tablet, TAKE 1 TABLET DAILY, Disp: 90 tablet, Rfl: 3   potassium chloride  SA (KLOR-CON  M) 20 MEQ tablet, Take 1 tablet (20 mEq total) by mouth daily as needed. ONLY TAKE POTASSIUM (1) TABLET WHEN YOU TAKE LASIX  (FUROSEMIDE ), Disp: 90 tablet, Rfl: 3   rosuvastatin  (CRESTOR ) 40 MG tablet, TAKE 1 TABLET DAILY, Disp: 90 tablet, Rfl: 2   sacubitril -valsartan  (ENTRESTO ) 24-26 MG, Take 1 tablet by mouth 2 (two) times daily., Disp: 60 tablet, Rfl: 11   spironolactone  (ALDACTONE ) 25 MG tablet, TAKE 1/2 TABLET(12.5MG      TOTAL) DAILY, Disp: 45 tablet, Rfl: 3   dapagliflozin propanediol  (FARXIGA ) 10 MG TABS tablet, Take 1 tablet (10 mg total) by mouth daily., Disp: 90 tablet, Rfl: 2   Lancets (ONETOUCH DELICA PLUS LANCET33G) MISC, Use as directed to check blood sugars once daily E11.65, Disp: 300 each, Rfl: 3   ONETOUCH VERIO test strip, Use as directed to check blood sugars once daily E11.65, Disp: 300 each, Rfl: 2   Allergies  Allergen Reactions   Janumet [Sitagliptin Phos-Metformin Hcl] Shortness Of Breath   Januvia [Sitagliptin] Shortness Of Breath   Kombiglyze [Saxagliptin-Metformin Er] Shortness Of Breath   Sulfa Antibiotics Shortness Of Breath   Elemental Sulfur Itching     Review of Systems  Constitutional: Negative.   Eyes:  Negative for blurred vision.  Respiratory: Negative.    Cardiovascular:  Negative.  Negative for chest pain.  Gastrointestinal: Negative.   Endocrine: Negative for polydipsia, polyphagia and polyuria.  Neurological: Negative.   Psychiatric/Behavioral: Negative.       Today's Vitals   01/17/24 1537  BP: 124/82  Pulse: 85  Temp: 98.4 F (36.9 C)  SpO2: 98%  Weight: 165 lb 3.2 oz (74.9 kg)  Height: 5' 2 (1.575 m)   Body mass index is 30.22 kg/m.  Wt Readings from Last 3 Encounters:  01/17/24 165 lb 3.2 oz (74.9 kg)  10/15/23 169 lb (76.7 kg)  09/14/23 168 lb (76.2 kg)     Objective:  Physical Exam Vitals and nursing note reviewed.  Constitutional:      Appearance: Normal appearance.  HENT:     Head: Normocephalic and atraumatic.   Eyes:     Extraocular Movements: Extraocular movements intact.    Cardiovascular:     Rate and Rhythm: Normal rate and regular rhythm.     Heart sounds: Normal heart sounds.  Pulmonary:     Effort: Pulmonary effort is normal.     Breath sounds: Normal breath sounds.   Musculoskeletal:     Cervical back:  Normal range of motion.   Skin:    General: Skin is warm.   Neurological:     General: No focal deficit present.     Mental Status: She is alert.   Psychiatric:        Mood and Affect: Mood normal.        Behavior: Behavior normal.         Assessment And Plan:  Type 2 diabetes mellitus with stage 3a chronic kidney disease, without long-term current use of insulin (HCC) Assessment & Plan: Chronic, she will continue with Farxiga  with MWF dosing.  She is encouraged to follow current dietary recommendations. She will follow up in 3-4 months for re-evaluation.   Orders: -     CMP14+EGFR -     Hemoglobin A1c -     TSH -     Dapagliflozin Propanediol ; Take 1 tablet (10 mg total) by mouth daily.  Dispense: 90 tablet; Refill: 2 -     OneTouch Verio; Use as directed to check blood sugars once daily E11.65  Dispense: 300 each; Refill: 2 -     OneTouch Delica Plus Lancet33G; Use as directed to check blood  sugars once daily E11.65  Dispense: 300 each; Refill: 3 -     US  RENAL; Future  Hypertensive heart and renal disease with renal failure, stage 1 through stage 4 or unspecified chronic kidney disease, with heart failure (HCC) Assessment & Plan: Chronic, well controlled. She is encouraged to follow low sodium diet. She will continue with furosemide  20mg  daily, metoprolol  XL 100mg  daily, spironolactone  25mg  1/2 tab daily and Entresto  24/26mg  twice daily. Importance of dietary/medication compliance was stressed to the patient. Most recent BMP results reviewed in detail.    HFrEF (heart failure with reduced ejection fraction) (HCC) Assessment & Plan: Chronic, appears to be euvolemic at this time. She will continue with metoprolol  XL 100mg , spironolactone  25mg , Farxiga  10mg , furosemide  20mg  and Entresto  24/26mg  as per Cardiology.    Pure hypercholesterolemia Assessment & Plan: Chronic, LDL goal is less than 70.  She will continue with rosuvastatin  40mg  daily. Encouraged to follow heart healthy diet.   Orders: -     TSH  Class 1 obesity due to excess calories with serious comorbidity and body mass index (BMI) of 30.0 to 30.9 in adult Assessment & Plan: Her BMI is acceptable for her demographic.  She is advised to aim for at least 150 minutes of exercise per week.    Menopause ovarian failure -     DG Bone Density; Future  Encounter for screening mammogram for malignant neoplasm of breast -     3D Screening Mammogram, Left and Right; Future     Return if symptoms worsen or fail to improve.  Patient was given opportunity to ask questions. Patient verbalized understanding of the plan and was able to repeat key elements of the plan. All questions were answered to their satisfaction.   I, Renee LOISE Slocumb, MD, have reviewed all documentation for this visit. The documentation on 01/17/24 for the exam, diagnosis, procedures, and orders are all accurate and complete.   IF YOU HAVE BEEN  REFERRED TO A SPECIALIST, IT MAY TAKE 1-2 WEEKS TO SCHEDULE/PROCESS THE REFERRAL. IF YOU HAVE NOT HEARD FROM US /SPECIALIST IN TWO WEEKS, PLEASE GIVE US  A CALL AT [phone] X [phone].   THE PATIENT IS ENCOURAGED TO PRACTICE SOCIAL DISTANCING DUE TO THE COVID-19 PANDEMIC.

## 2024-01-17 NOTE — Assessment & Plan Note (Signed)
 Her BMI is acceptable for her demographic.  She is advised to aim for at least 150 minutes of exercise per week.

## 2024-01-17 NOTE — Assessment & Plan Note (Signed)
 Chronic, appears to be euvolemic at this time. She will continue with metoprolol XL 100mg , spironolactone 25mg , Farxiga 10mg , furosemide 20mg  and Entresto 24/26mg  as per Cardiology.

## 2024-01-17 NOTE — Assessment & Plan Note (Signed)
Chronic, LDL goal is less than 70.  She will continue with rosuvastatin 40mg  daily. Encouraged to follow heart healthy diet.

## 2024-01-17 NOTE — Patient Instructions (Addendum)
 Children's loratadine  (Claritin )  Type 2 Diabetes Mellitus, Diagnosis, Adult Type 2 diabetes (type 2 diabetes mellitus) is a long-term (chronic) disease. It may happen when there is one or both of these problems: The pancreas does not make enough insulin. The body does not react in a normal way to insulin that it makes. Insulin lets sugars go into cells in your body. If you have type 2 diabetes, sugars cannot get into your cells. Sugars build up in the blood. This causes high blood sugar. What are the causes? The exact cause of this condition is not known. What increases the risk? Having type 2 diabetes in your family. Being overweight or very overweight. Not being active. Your body not reacting in a normal way to the insulin it makes. Having higher than normal blood sugar over time. Having a type of diabetes when you were pregnant. Having a condition that causes small fluid-filled sacs on your ovaries. What are the signs or symptoms? At first, you may have no symptoms. You will get symptoms slowly. They may include: More thirst than normal. More hunger than normal. Needing to pee more than normal. Losing weight without trying. Feeling tired. Feeling weak. Seeing things blurry. Dark patches on your skin. How is this treated? This condition may be treated by a diabetes expert. You may need to: Follow an eating plan made by a food expert (dietitian). Get regular exercise. Find ways to deal with stress. Check blood sugar as often as told. Take medicines. Your doctor will set treatment goals for you. Your blood sugar should be at these levels: Before meals: 80-130 mg/dL (4.4-7.2 mmol/L). After meals: below 180 mg/dL (10 mmol/L). Over the last 2-3 months: less than 7%. Follow these instructions at home: Medicines Take your diabetes medicines or insulin every day. Take medicines as told to help you prevent other problems caused by this condition. You may need: Aspirin. Medicine  to lower cholesterol. Medicine to control blood pressure. Questions to ask your doctor Should I meet with a diabetes educator? What medicines do I need, and when should I take them? What will I need to treat my condition at home? When should I check my blood sugar? Where can I find a support group? Who can I call if I have questions? When is my next doctor visit? General instructions Take over-the-counter and prescription medicines only as told by your doctor. Keep all follow-up visits. Where to find more information For help and guidance and more information about diabetes, please go to: American Diabetes Association (ADA): www.diabetes.org American Association of Diabetes Care and Education Specialists (ADCES): www.diabeteseducator.org International Diabetes Federation (IDF): DCOnly.dk Contact a doctor if: Your blood sugar is at or above 240 mg/dL (16.1 mmol/L) for 2 days in a row. You have been sick for 2 days or more, and you are not getting better. You have had a fever for 2 days or more, and you are not getting better. You have any of these problems for more than 6 hours: You cannot eat or drink. You feel like you may vomit. You vomit. You have watery poop (diarrhea). Get help right away if: Your blood sugar is lower than 54 mg/dL (3 mmol/L). You feel mixed up (confused). You have trouble thinking clearly. You have trouble breathing. You have medium or large ketone levels in your pee. These symptoms may be an emergency. Get help right away. Call your local emergency services (911 in the [place] Do not wait to see if the symptoms will go away.  Do not drive yourself to the hospital. Summary Type 2 diabetes is a long-term disease. Your pancreas may not make enough insulin, or your body may not react in a normal way to insulin that it makes. This condition is treated with an eating plan, lifestyle changes, and medicines. Your doctor will set treatment goals for you. These  will help you keep your blood sugar in a healthy range. Keep all follow-up visits. This information is not intended to replace advice given to you by your health care provider. Make sure you discuss any questions you have with your health care provider. Document Revised: 10/14/2020 Document Reviewed: 10/14/2020 Elsevier Patient Education  2024 [employer].

## 2024-01-17 NOTE — Assessment & Plan Note (Signed)
Chronic, she will continue with [place] with MWF dosing.  She is encouraged to follow current dietary recommendations. She will follow up in 3-4 months for re-evaluation.

## 2024-01-17 NOTE — Assessment & Plan Note (Signed)
 Chronic, well controlled. She is encouraged to follow low sodium diet. She will continue with furosemide 20mg  daily, metoprolol XL 100mg  daily, spironolactone 25mg  1/2 tab daily and Entresto 24/26mg  twice daily. Importance of dietary/medication compliance was stressed to the patient. Most recent BMP results reviewed in detail.

## 2024-01-18 ENCOUNTER — Other Ambulatory Visit: Payer: Self-pay | Admitting: Internal Medicine

## 2024-01-18 ENCOUNTER — Ambulatory Visit: Payer: Self-pay | Admitting: Internal Medicine

## 2024-01-18 DIAGNOSIS — E1122 Type 2 diabetes mellitus with diabetic chronic kidney disease: Secondary | ICD-10-CM

## 2024-01-18 DIAGNOSIS — I5020 Unspecified systolic (congestive) heart failure: Secondary | ICD-10-CM

## 2024-01-18 DIAGNOSIS — I130 Hypertensive heart and chronic kidney disease with heart failure and stage 1 through stage 4 chronic kidney disease, or unspecified chronic kidney disease: Secondary | ICD-10-CM

## 2024-01-18 LAB — HEMOGLOBIN A1C
Est. average glucose Bld gHb Est-mCnc: 123 mg/dL
Hgb A1c MFr Bld: 5.9 % — ABNORMAL HIGH (ref 4.8–5.6)

## 2024-01-18 LAB — CMP14+EGFR
ALT: 14 IU/L (ref 0–32)
AST: 18 IU/L (ref 0–40)
Albumin: 4.3 g/dL (ref 3.8–4.8)
Alkaline Phosphatase: 70 IU/L (ref 44–121)
BUN/Creatinine Ratio: 22 (ref 12–28)
BUN: 31 mg/dL — ABNORMAL HIGH (ref 8–27)
Bilirubin Total: 0.8 mg/dL (ref 0.0–1.2)
CO2: 17 mmol/L — ABNORMAL LOW (ref 20–29)
Calcium: 9.6 mg/dL (ref 8.7–10.3)
Chloride: 108 mmol/L — ABNORMAL HIGH (ref 96–106)
Creatinine, Ser: 1.4 mg/dL — ABNORMAL HIGH (ref 0.57–1.00)
Globulin, Total: 2.3 g/dL (ref 1.5–4.5)
Glucose: 92 mg/dL (ref 70–99)
Potassium: 5.1 mmol/L (ref 3.5–5.2)
Sodium: 141 mmol/L (ref 134–144)
Total Protein: 6.6 g/dL (ref 6.0–8.5)
eGFR: 39 mL/min/{1.73_m2} — ABNORMAL LOW (ref >59)

## 2024-01-18 LAB — TSH: TSH: 0.856 u[IU]/mL (ref 0.450–4.500)

## 2024-01-25 ENCOUNTER — Ambulatory Visit
Admission: RE | Admit: 2024-01-25 | Discharge: 2024-01-25 | Disposition: A | Discharge status: Home / Self Care | Source: Ambulatory Visit | Attending: Internal Medicine | Admitting: Internal Medicine

## 2024-01-25 DIAGNOSIS — Z1231 Encounter for screening mammogram for malignant neoplasm of breast: Secondary | ICD-10-CM | POA: Diagnosis not present

## 2024-02-22 ENCOUNTER — Other Ambulatory Visit: Payer: Self-pay | Admitting: Internal Medicine

## 2024-02-22 DIAGNOSIS — E785 Hyperlipidemia, unspecified: Secondary | ICD-10-CM | POA: Diagnosis not present

## 2024-02-22 DIAGNOSIS — E1122 Type 2 diabetes mellitus with diabetic chronic kidney disease: Secondary | ICD-10-CM | POA: Diagnosis not present

## 2024-02-22 DIAGNOSIS — I5020 Unspecified systolic (congestive) heart failure: Secondary | ICD-10-CM | POA: Diagnosis not present

## 2024-02-22 DIAGNOSIS — N1831 Chronic kidney disease, stage 3a: Secondary | ICD-10-CM | POA: Diagnosis not present

## 2024-02-22 DIAGNOSIS — I4891 Unspecified atrial fibrillation: Secondary | ICD-10-CM | POA: Diagnosis not present

## 2024-02-22 MED ORDER — APIXABAN 5 MG PO TABS: 5.0000 mg | ORAL_TABLET | Freq: Two times a day (BID) | ORAL | 2 refills | Status: AC

## 2024-02-23 LAB — LAB REPORT - SCANNED
Albumin, Urine POC: 24.1
Creatinine, POC: 135.6 mg/dL
Microalb Creat Ratio: 18

## 2024-03-22 ENCOUNTER — Ambulatory Visit: Admitting: Nurse Practitioner

## 2024-03-22 ENCOUNTER — Ambulatory Visit: Payer: Self-pay

## 2024-03-22 ENCOUNTER — Encounter: Payer: Self-pay | Admitting: Nurse Practitioner

## 2024-03-22 VITALS — Temp 98.4°F | Ht 62.0 in | Wt 164.6 lb

## 2024-03-22 DIAGNOSIS — R42 Dizziness and giddiness: Secondary | ICD-10-CM

## 2024-03-22 DIAGNOSIS — E66811 Obesity, class 1: Secondary | ICD-10-CM | POA: Diagnosis not present

## 2024-03-22 DIAGNOSIS — Z6830 Body mass index (BMI) 30.0-30.9, adult: Secondary | ICD-10-CM | POA: Diagnosis not present

## 2024-03-22 DIAGNOSIS — E6609 Other obesity due to excess calories: Secondary | ICD-10-CM

## 2024-03-22 DIAGNOSIS — H6121 Impacted cerumen, right ear: Secondary | ICD-10-CM

## 2024-03-22 NOTE — Telephone Encounter (Signed)
 FYI Only or Action Required?: FYI only for provider.  Patient was last seen in primary care on 01/17/2024 by Jarold Medici, MD.  Called Nurse Triage reporting Dizziness.  Symptoms began today.  Interventions attempted: Rest, hydration, or home remedies.  Symptoms are: stable.  Triage Disposition: See Physician Within 24 Hours  Patient/caregiver understands and will follow disposition?: Yes Copied from CRM #[id number]. Topic: Clinical - Red Word Triage >> Mar 22, 2024 12:28 PM Myrick T wrote: Red Word that prompted transfer to Nurse Triage: after she got out the shower she had a bad dizzy. Reason for Disposition  [1] NO dizziness now AND [2] age > 62  [1] NO dizziness now AND [2] one or more stroke risk factors (i.e., hypertension, diabetes, prior stroke/TIA/heart attack)  Answer Assessment - Initial Assessment Questions 1. DESCRIPTION: Describe your dizziness.     Spinning real bad and felt like I was going to pass out. I felt like I was going to fall so I helt on to the towel rack  2. VERTIGO: Do you feel like either you or the room is spinning or tilting?      Spinning  3. LIGHTHEADED: Do you feel lightheaded? (e.g., somewhat faint, woozy, weak upon standing)     No  4. SEVERITY: How bad is it?  Can you walk?     Felt like I couldn't walk, but able to walk holding on the wall and get some water  5. ONSET:  When did the dizziness begin?     Has been having dizziness every now and then but this is the worse  6. AGGRAVATING FACTORS: Does anything make it worse? (e.g., standing, change in head position)     *No Answer* 7. CAUSE: What do you think is causing the dizziness?     Unsure of cause  8. RECURRENT SYMPTOM: Have you had dizziness before? If Yes, ask: When was the last time? What happened that time?     *No Answer* 9. OTHER SYMPTOMS: Do you have any other symptoms? (e.g., earache, headache, numbness, tinnitus, vomiting, weakness)     No  10.  PREGNANCY: Is there any chance you are pregnant? When was your last menstrual period?       No  Protocols used: Dizziness - Vertigo-A-AH

## 2024-03-22 NOTE — Progress Notes (Signed)
 LILLETTE Renee Bradley, CMA,acting as a Neurosurgeon for Renee Ada, FNP.,have documented all relevant documentation on the behalf of Renee Ada, FNP,as directed by  Renee Ada, FNP while in the presence of Renee Ada, FNP.  Subjective:  Patient ID: Renee Bradley , female    DOB: Mar 08, 1946 , 78 y.o.   MRN: [id number]  Chief Complaint  Patient presents with   Dizziness    Patient presents today for dizziness, patient reports this first started feeling dizziness this morning. She reports she is feeling better now. She reported the dizziness lasted about 30 minutes. She reports she was dizzy getting out the shower.     HPI Discussed the use of AI scribe software for clinical note transcription with the patient, who gave verbal consent to proceed.  History of Present Illness Renee Bradley Sheffer is a 78 year old female with diabetes who presents with an episode of dizziness.  She experienced a sudden episode of intense dizziness this morning while getting dressed after a shower, lasting about 30 minutes. She held onto the towel rack for support and moved to the sink as the dizziness worsened. Drinking water and splashing some on her face helped alleviate the symptoms. Later, drinking a cold bottle of water from the refrigerator further alleviated the dizziness.  Her medication bottles indicate dizziness as a potential side effect, but she has not experienced such an episode before and has not started any new medications recently.  She has a history of diabetes but did not check her blood sugar this morning. She usually does not have problems with her blood sugar in the morning or at night and is diligent about monitoring it. She did not feel that her blood sugar was the cause of the dizziness.  She recalls a previous discussion about reducing salt intake. She occasionally uses a salt shaker and consumes potato chips and peanuts. She drinks four 16.9 oz bottles of water daily.  No fever, headache,  shortness of breath, or chest pain.   She was wondering if this episode is related to her salt intake.  Dizziness This is a new problem. The current episode started today. Episode frequency: once for about 30 minutes. Pertinent negatives include no abdominal pain, fatigue, fever or vomiting. Nothing aggravates the symptoms. She has tried nothing for the symptoms.     Past Medical History:  Diagnosis Date   Asthma    Chest pressure 07/13/2012   Diabetes mellitus without complication (HCC)    DM (diabetes mellitus) (HCC) 07/13/2012   HTN (hypertension) 07/13/2012   Hypercholesterolemia    Hypertension    SOB (shortness of breath) 07/13/2012   Tachycardia, unspecified, fluttering 07/13/2012     Family History  Problem Relation Age of Onset   Heart attack Mother    Diabetes type II Mother    Stroke Mother    Alzheimer's disease Father    Diabetes type II Brother      Current Outpatient Medications:    apixaban  (ELIQUIS ) 5 MG TABS tablet, Take 1 tablet (5 mg total) by mouth 2 (two) times daily., Disp: 180 tablet, Rfl: 2   dapagliflozin  propanediol (FARXIGA ) 10 MG TABS tablet, Take 1 tablet (10 mg total) by mouth daily., Disp: 90 tablet, Rfl: 2   furosemide  (LASIX ) 20 MG tablet, Take 1 tablet (20 mg total) by mouth daily as needed., Disp: 90 tablet, Rfl: 3   Lancets (ONETOUCH DELICA PLUS LANCET33G) MISC, Use as directed to check blood sugars once daily E11.65, Disp: 300 each,  Rfl: 3   loratadine  (CLARITIN ) 10 MG tablet, TAKE 1 TABLET BY MOUTH EVERY DAY AS NEEDED FOR ALLERGY, Disp: 90 tablet, Rfl: 1   metoprolol  succinate (TOPROL -XL) 100 MG 24 hr tablet, TAKE 1 TABLET DAILY, Disp: 90 tablet, Rfl: 3   ONETOUCH VERIO test strip, Use as directed to check blood sugars once daily E11.65, Disp: 300 each, Rfl: 2   potassium chloride  SA (KLOR-CON  M) 20 MEQ tablet, Take 1 tablet (20 mEq total) by mouth daily as needed. ONLY TAKE POTASSIUM (1) TABLET WHEN YOU TAKE LASIX  (FUROSEMIDE ),  Disp: 90 tablet, Rfl: 3   rosuvastatin  (CRESTOR ) 40 MG tablet, TAKE 1 TABLET DAILY, Disp: 90 tablet, Rfl: 2   sacubitril -valsartan  (ENTRESTO ) 24-26 MG, Take 1 tablet by mouth 2 (two) times daily., Disp: 60 tablet, Rfl: 11   spironolactone  (ALDACTONE ) 25 MG tablet, TAKE 1/2 TABLET(12.5MG      TOTAL) DAILY, Disp: 45 tablet, Rfl: 3   Allergies  Allergen Reactions   Janumet [Sitagliptin Phos-Metformin Hcl] Shortness Of Breath   Januvia [Sitagliptin] Shortness Of Breath   Kombiglyze [Saxagliptin-Metformin Er] Shortness Of Breath   Sulfa Antibiotics Shortness Of Breath   Elemental Sulfur Itching     Review of Systems  Constitutional: Negative.  Negative for fatigue and fever.  Eyes: Negative.   Gastrointestinal:  Negative for abdominal pain and vomiting.  Musculoskeletal: Negative.   Skin: Negative.   Neurological:  Positive for dizziness.  Psychiatric/Behavioral: Negative.       Today's Vitals   03/22/24 1623  Temp: 98.4 F (36.9 C)  TempSrc: Oral  Weight: 164 lb 9.6 oz (74.7 kg)  Height: 5' 2 (1.575 m)  PainSc: 0-No pain   Body mass index is 30.11 kg/m.  Wt Readings from Last 3 Encounters:  03/28/24 164 lb (74.4 kg)  03/22/24 164 lb 9.6 oz (74.7 kg)  01/17/24 165 lb 3.2 oz (74.9 kg)     Objective:  Physical Exam Vitals and nursing note reviewed.  Constitutional:      General: She is not in acute distress.    Appearance: Normal appearance. She is well-developed.  HENT:     Head: Normocephalic and atraumatic.     Right Ear: External ear normal. There is impacted cerumen.     Left Ear: Tympanic membrane, ear canal and external ear normal. There is no impacted cerumen.  Eyes:     Pupils: Pupils are equal, round, and reactive to light.  Cardiovascular:     Rate and Rhythm: Normal rate and regular rhythm.     Pulses: Normal pulses.     Heart sounds: Normal heart sounds. No murmur heard. Pulmonary:     Effort: Pulmonary effort is normal.     Breath sounds: Normal  breath sounds.  Musculoskeletal:        General: Normal range of motion.  Skin:    General: Skin is warm and dry.     Capillary Refill: Capillary refill takes less than 2 seconds.  Neurological:     General: No focal deficit present.     Mental Status: She is alert and oriented to person, place, and time.     Cranial Nerves: No cranial nerve deficit.     Motor: No weakness.  Psychiatric:        Mood and Affect: Mood normal.         Assessment And Plan:  Dizziness Assessment & Plan: Acute episode resolved with hydration. Possible medication side effects or dietary sodium influence. - Schedule follow-up on Friday  before 12 PM for lab review and further evaluation.  Orders: -     CBC -     BMP8+eGFR  Excessive cerumen in right ear canal Assessment & Plan: She is to come for ear flushing on Tuesday. This may help with her dizziness.    Class 1 obesity due to excess calories with serious comorbidity and body mass index (BMI) of 30.0 to 30.9 in adult Assessment & Plan: She is encouraged to strive for BMI less than 30 to decrease cardiac risk. Advised to aim for at least 150 minutes of exercise per week.       Return if symptoms worsen or fail to improve, for return on Tuesday for right ear flush.   Patient was given opportunity to ask questions. Patient verbalized understanding of the plan and was able to repeat key elements of the plan. All questions were answered to their satisfaction.    LILLETTE Renee Ada, FNP, have reviewed all documentation for this visit. The documentation on 03/31/24 for the exam, diagnosis, procedures, and orders are all accurate and complete.   IF YOU HAVE BEEN REFERRED TO A SPECIALIST, IT MAY TAKE 1-2 WEEKS TO SCHEDULE/PROCESS THE REFERRAL. IF YOU HAVE NOT HEARD FROM US /SPECIALIST IN TWO WEEKS, PLEASE GIVE US  A CALL AT [phone].

## 2024-03-23 LAB — CBC
Hematocrit: 41.9 % (ref 34.0–46.6)
Hemoglobin: 13.3 g/dL (ref 11.1–15.9)
MCH: 29.6 pg (ref 26.6–33.0)
MCHC: 31.7 g/dL (ref 31.5–35.7)
MCV: 93 fL (ref 79–97)
Platelets: 191 x10E3/uL (ref 150–450)
RBC: 4.49 x10E6/uL (ref 3.77–5.28)
RDW: 14.3 % (ref 11.7–15.4)
WBC: 4.8 x10E3/uL (ref 3.4–10.8)

## 2024-03-23 LAB — BMP8+EGFR
BUN/Creatinine Ratio: 22 (ref 12–28)
BUN: 28 mg/dL — ABNORMAL HIGH (ref 8–27)
CO2: 20 mmol/L (ref 20–29)
Calcium: 9.7 mg/dL (ref 8.7–10.3)
Chloride: 108 mmol/L — ABNORMAL HIGH (ref 96–106)
Creatinine, Ser: 1.28 mg/dL — ABNORMAL HIGH (ref 0.57–1.00)
Glucose: 93 mg/dL (ref 70–99)
Potassium: 5.2 mmol/L (ref 3.5–5.2)
Sodium: 143 mmol/L (ref 134–144)
eGFR: 43 mL/min/1.73 — ABNORMAL LOW (ref >59)

## 2024-03-28 ENCOUNTER — Ambulatory Visit

## 2024-03-28 VITALS — BP 124/82 | Temp 98.1°F | Ht 62.0 in | Wt 164.0 lb

## 2024-03-28 DIAGNOSIS — R232 Flushing: Secondary | ICD-10-CM

## 2024-03-28 NOTE — Progress Notes (Signed)
 Patient presents today for right ear flush.

## 2024-03-31 ENCOUNTER — Ambulatory Visit: Payer: Self-pay | Admitting: Nurse Practitioner

## 2024-03-31 NOTE — Assessment & Plan Note (Addendum)
 She is to come for ear flushing on Tuesday. This may help with her dizziness.

## 2024-03-31 NOTE — Assessment & Plan Note (Signed)
 Acute episode resolved with hydration. Possible medication side effects or dietary sodium influence. - Schedule follow-up on Friday before 12 PM for lab review and further evaluation.

## 2024-03-31 NOTE — Assessment & Plan Note (Signed)
 She is encouraged to strive for BMI less than 30 to decrease cardiac risk. Advised to aim for at least 150 minutes of exercise per week.

## 2024-04-12 ENCOUNTER — Other Ambulatory Visit: Payer: Self-pay | Admitting: Internal Medicine

## 2024-04-17 ENCOUNTER — Other Ambulatory Visit: Payer: Self-pay | Admitting: Internal Medicine

## 2024-04-17 NOTE — Telephone Encounter (Unsigned)
 Copied from CRM [id number]. Topic: Clinical - Medication Refill >> Apr 17, 2024 10:50 AM Hillary B wrote: Medication: furosemide  (LASIX ) 20 MG tablet  Has the patient contacted their pharmacy? No  This is the patient's preferred pharmacy:   [id number] - [place], [place] - [address] AT [facility] [address] [place] [address] Phone: [phone] Fax: [phone]  Is this the correct pharmacy for this prescription? Yes If no, delete pharmacy and type the correct one.   Has the prescription been filled recently? Yes  Is the patient out of the medication? No. 3 left.   Has the patient been seen for an appointment in the last year OR does the patient have an upcoming appointment? Yes  Can we respond through MyChart? Yes  Agent: Please be advised that Rx refills may take up to 3 business days. We ask that you follow-up with your pharmacy.

## 2024-04-17 NOTE — Telephone Encounter (Signed)
 FYI Only or Action Required?: Action required by provider: medication refill request.  Patient was last seen in primary care on 03/22/2024 by Georgina Speaks, FNP.  Called Nurse Triage reporting No chief complaint on file..  Symptoms began today.  Interventions attempted: Nothing.  Symptoms are: stable.  Triage Disposition: No disposition on file.  Patient/caregiver understands and will follow disposition?:

## 2024-04-18 MED ORDER — FUROSEMIDE 20 MG PO TABS: 20.0000 mg | ORAL_TABLET | Freq: Every day | ORAL | 3 refills | Status: AC | PRN

## 2024-05-17 ENCOUNTER — Ambulatory Visit (INDEPENDENT_AMBULATORY_CARE_PROVIDER_SITE_OTHER): Payer: Self-pay

## 2024-05-17 ENCOUNTER — Encounter: Payer: Self-pay | Admitting: Internal Medicine

## 2024-05-17 ENCOUNTER — Ambulatory Visit (INDEPENDENT_AMBULATORY_CARE_PROVIDER_SITE_OTHER): Payer: Self-pay | Admitting: Internal Medicine

## 2024-05-17 VITALS — BP 122/60 | HR 58 | Temp 98.4°F | Ht 62.0 in | Wt 169.4 lb

## 2024-05-17 VITALS — BP 122/60 | HR 58 | Temp 98.4°F | Ht 62.0 in | Wt 169.0 lb

## 2024-05-17 DIAGNOSIS — I130 Hypertensive heart and chronic kidney disease with heart failure and stage 1 through stage 4 chronic kidney disease, or unspecified chronic kidney disease: Secondary | ICD-10-CM

## 2024-05-17 DIAGNOSIS — E66811 Obesity, class 1: Secondary | ICD-10-CM

## 2024-05-17 DIAGNOSIS — Z6830 Body mass index (BMI) 30.0-30.9, adult: Secondary | ICD-10-CM | POA: Diagnosis not present

## 2024-05-17 DIAGNOSIS — E6609 Other obesity due to excess calories: Secondary | ICD-10-CM

## 2024-05-17 DIAGNOSIS — E7800 Pure hypercholesterolemia, unspecified: Secondary | ICD-10-CM | POA: Diagnosis not present

## 2024-05-17 DIAGNOSIS — I5020 Unspecified systolic (congestive) heart failure: Secondary | ICD-10-CM | POA: Diagnosis not present

## 2024-05-17 DIAGNOSIS — N1831 Chronic kidney disease, stage 3a: Secondary | ICD-10-CM

## 2024-05-17 DIAGNOSIS — E1122 Type 2 diabetes mellitus with diabetic chronic kidney disease: Secondary | ICD-10-CM

## 2024-05-17 DIAGNOSIS — Z23 Encounter for immunization: Secondary | ICD-10-CM | POA: Diagnosis not present

## 2024-05-17 DIAGNOSIS — Z0000 Encounter for general adult medical examination without abnormal findings: Secondary | ICD-10-CM | POA: Diagnosis not present

## 2024-05-17 NOTE — Patient Instructions (Signed)

## 2024-05-17 NOTE — Patient Instructions (Addendum)
 [name],  Thank you for taking the time for your Medicare Wellness Visit. I appreciate your continued commitment to your health goals. Please review the care plan we discussed, and feel free to reach out if I can assist you further.  Medicare recommends these wellness visits once per year to help you and your care team stay ahead of potential health issues. These visits are designed to focus on prevention, allowing your provider to concentrate on managing your acute and chronic conditions during your regular appointments.  Please note that Annual Wellness Visits do not include a physical exam. Some assessments may be limited, especially if the visit was conducted virtually. If needed, we may recommend a separate in-person follow-up with your provider.  Ongoing Care Seeing your primary care provider every 3 to 6 months helps us  monitor your health and provide consistent, personalized care.   Referrals If a referral was made during today's visit and you haven't received any updates within two weeks, please contact the referred provider directly to check on the status.  Recommended Screenings:  Health Maintenance  Topic Date Due   COVID-19 Vaccine (4 - 2025-26 season) 04/03/2024   Complete foot exam   05/12/2024   Zoster (Shingles) Vaccine (1 of 2) 08/17/2024*   DTaP/Tdap/Td vaccine (2 - Tdap) 01/16/2025*   Pneumococcal Vaccine for age over 52 (1 of 2 - PCV) 01/16/2025*   Eye exam for diabetics  06/07/2024   Hemoglobin A1C  07/18/2024   Yearly kidney health urinalysis for diabetes  02/22/2025   Yearly kidney function blood test for diabetes  03/22/2025   Medicare Annual Wellness Visit  05/17/2025   Flu Shot  Completed   DEXA scan (bone density measurement)  Completed   Hepatitis C Screening  Completed   Meningitis B Vaccine  Aged Out   Breast Cancer Screening  Discontinued   Cologuard (Stool DNA test)  Discontinued  *Topic was postponed. The date shown is not the original due date.        05/17/2024    2:55 PM  Advanced Directives  Does Patient Have a Medical Advance Directive? No   Advance Care Planning is important because it: Ensures you receive medical care that aligns with your values, goals, and preferences. Provides guidance to your family and loved ones, reducing the emotional burden of decision-making during critical moments.  Vision: Annual vision screenings are recommended for early detection of glaucoma, cataracts, and diabetic retinopathy. These exams can also reveal signs of chronic conditions such as diabetes and high blood pressure.  Dental: Annual dental screenings help detect early signs of oral cancer, gum disease, and other conditions linked to overall health, including heart disease and diabetes.  Please see the attached documents for additional preventive care recommendations.

## 2024-05-17 NOTE — Progress Notes (Signed)
 I,[name], CMA,acting as a Neurosurgeon for [name], MD.,have documented all relevant documentation on the behalf of [name], MD,as directed by  [name], MD while in the presence of [name], MD.  Subjective:  Patient ID: Renee Bradley , female    DOB: 1946/06/03 , 78 y.o.   MRN: [id number]  Chief Complaint  Patient presents with   Diabetes    She presents today for diabetes, htn & chol check. She reports compliance with meds.  She denies having any chest pain and shortness of breath.  AWV completed with [facility] advisor: Jiles.    Hypertension   Hyperlipidemia    HPI Discussed the use of AI scribe software for clinical note transcription with the patient, who gave verbal consent to proceed.  History of Present Illness Renee Bradley is a 78 year old female with diabetes and hypertension who presents for a diabetes and blood pressure check during her annual wellness visit.  Her diabetes is under good control, and she has been practicing intermittent fasting, which she finds beneficial. She has adjusted her diet by consuming meat and vegetables before carbohydrates.  She is currently on several medications including Eliquis , Farxiga  10 mg, furosemide  20 mg, loratadine  10 mg as needed, rosuvastatin  40 mg, Entresto  24/26 mg twice daily, and Aldactone  25 mg (a half a tablet) daily. She inquires about the possibility of changing her cholesterol medication in the future.  Her kidneys were checked in August by Nephrology, and she was advised to return in four months. She had a mammogram in June and needs to reschedule her bone density test, which was initially ordered at a location that no longer performs the test.  She has cataract surgery on her right eye scheduled for February. She maintains regular dental appointments every six months and is awaiting a chest checkup as part of her yearly examination.   Diabetes She presents for her follow-up diabetic  visit. She has type 2 diabetes mellitus. Her disease course has been stable. There are no hypoglycemic associated symptoms. There are no diabetic associated symptoms. Pertinent negatives for diabetes include no blurred vision, no chest pain, no polydipsia, no polyphagia and no polyuria. There are no hypoglycemic complications. Diabetic complications include nephropathy. Risk factors for coronary artery disease include diabetes mellitus, dyslipidemia, hypertension, post-menopausal, sedentary lifestyle and obesity. She is compliant with treatment some of the time. Her weight is fluctuating minimally. She is following a diabetic diet. She participates in exercise intermittently. An ACE inhibitor/angiotensin II receptor blocker is not being taken.  Hypertension This is a chronic problem. The current episode started more than 1 year ago. The problem has been gradually improving since onset. The problem is uncontrolled. Pertinent negatives include no blurred vision or chest pain. The current treatment provides moderate improvement. Hypertensive end-organ damage includes kidney disease.     Past Medical History:  Diagnosis Date   Asthma    Chest pressure 07/13/2012   Diabetes mellitus without complication (HCC)    DM (diabetes mellitus) (HCC) 07/13/2012   HTN (hypertension) 07/13/2012   Hypercholesterolemia    Hypertension    SOB (shortness of breath) 07/13/2012   Tachycardia, unspecified, fluttering 07/13/2012     Family History  Problem Relation Age of Onset   Heart attack Mother    Diabetes type II Mother    Stroke Mother    Alzheimer's disease Father    Diabetes type II Brother      Current Outpatient Medications:  apixaban  (ELIQUIS ) 5 MG TABS tablet, Take 1 tablet (5 mg total) by mouth 2 (two) times daily., Disp: 180 tablet, Rfl: 2   dapagliflozin  propanediol (FARXIGA ) 10 MG TABS tablet, Take 1 tablet (10 mg total) by mouth daily., Disp: 90 tablet, Rfl: 2   furosemide  (LASIX ) 20 MG  tablet, Take 1 tablet (20 mg total) by mouth daily as needed., Disp: 90 tablet, Rfl: 3   Lancets (ONETOUCH DELICA PLUS LANCET33G) MISC, Use as directed to check blood sugars once daily E11.65, Disp: 300 each, Rfl: 3   loratadine  (CLARITIN ) 10 MG tablet, TAKE 1 TABLET BY MOUTH EVERY DAY AS NEEDED FOR ALLERGY, Disp: 90 tablet, Rfl: 1   metoprolol  succinate (TOPROL -XL) 100 MG 24 hr tablet, TAKE 1 TABLET DAILY, Disp: 90 tablet, Rfl: 3   ONETOUCH VERIO test strip, Use as directed to check blood sugars once daily E11.65, Disp: 300 each, Rfl: 2   potassium chloride  SA (KLOR-CON  M) 20 MEQ tablet, Take 1 tablet (20 mEq total) by mouth daily as needed. ONLY TAKE POTASSIUM (1) TABLET WHEN YOU TAKE LASIX  (FUROSEMIDE ), Disp: 90 tablet, Rfl: 3   rosuvastatin  (CRESTOR ) 40 MG tablet, TAKE 1 TABLET DAILY, Disp: 90 tablet, Rfl: 2   sacubitril -valsartan  (ENTRESTO ) 24-26 MG, Take 1 tablet by mouth 2 (two) times daily., Disp: 60 tablet, Rfl: 11   spironolactone  (ALDACTONE ) 25 MG tablet, TAKE 1/2 TABLET(12.5MG      TOTAL) DAILY, Disp: 45 tablet, Rfl: 3   Allergies  Allergen Reactions   Janumet [Sitagliptin Phos-Metformin Hcl] Shortness Of Breath   Januvia [Sitagliptin] Shortness Of Breath   Kombiglyze [Saxagliptin-Metformin Er] Shortness Of Breath   Sulfa Antibiotics Shortness Of Breath   Elemental Sulfur Itching     Review of Systems  Constitutional: Negative.   Eyes:  Negative for blurred vision.  Respiratory: Negative.    Cardiovascular: Negative.  Negative for chest pain.  Gastrointestinal: Negative.   Endocrine: Negative for polydipsia, polyphagia and polyuria.  Neurological: Negative.   Psychiatric/Behavioral: Negative.       Today's Vitals   05/17/24 1504  BP: 122/60  Pulse: (!) 58  Temp: 98.4 F (36.9 C)  SpO2: 98%  Weight: 169 lb (76.7 kg)  Height: 5' 2 (1.575 m)   Body mass index is 30.91 kg/m.  Wt Readings from Last 3 Encounters:  05/17/24 169 lb (76.7 kg)  05/17/24 169 lb 6.4  oz (76.8 kg)  03/28/24 164 lb (74.4 kg)     Objective:  Physical Exam Vitals and nursing note reviewed.  Constitutional:      Appearance: Normal appearance.  HENT:     Head: Normocephalic and atraumatic.  Eyes:     Extraocular Movements: Extraocular movements intact.  Cardiovascular:     Rate and Rhythm: Normal rate and regular rhythm.     Heart sounds: Normal heart sounds.  Pulmonary:     Effort: Pulmonary effort is normal.     Breath sounds: Normal breath sounds.  Musculoskeletal:     Cervical back: Normal range of motion.  Skin:    General: Skin is warm.  Neurological:     General: No focal deficit present.     Mental Status: She is alert.  Psychiatric:        Mood and Affect: Mood normal.        Behavior: Behavior normal.         Assessment And Plan:  Type 2 diabetes mellitus with stage 3a chronic kidney disease, without long-term current use of insulin (HCC) Assessment &  Plan: Type 2 diabetes with associated chronic kidney disease. Blood glucose levels are well-controlled. Recent A1c levels are under control. - Order A1c test to monitor diabetes control. - Continue with Farxiga  daily - Follow dietary recommendations - Continue current diabetes management plan.    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 Regular follow-up with nephrologist Dr. Melia is ongoing. - Follow up with nephrologist Dr. Melia as scheduled. - Continue with furosemide  20mg  daily, metoprolol  XL 100mg  daily, spironolactone  25mg  1/2 tab daily and entresto  24/26mg  twice daily - Follow low sodium diet.      Pure hypercholesterolemia Assessment & Plan: Hypercholesterolemia managed with rosuvastatin . LDL levels  need to be maintained below 70 due to diabetes. Last LDL was 76, indicating a need for re-evaluation. - Order lipid panel to recheck LDL levels. - Continue rosuvastatin  therapy.  Orders: -     Lipid panel -     Hepatic function panel  HFrEF (heart failure with reduced ejection fraction) (HCC) Assessment & Plan: Chronic, appears to be euvolemic at this time. She will continue with metoprolol  XL 100mg , spironolactone  25mg , Farxiga  10mg , furosemide  20mg  and Entresto  24/26mg  as per Cardiology.    Class 1 obesity due to excess calories with serious comorbidity and body mass index (BMI) of 30.0 to 30.9 in adult Assessment & Plan: Her BMI is acceptable for her demographic.  She is advised to aim for at least 150 minutes of exercise per week.     Return for 4 month dm f/u. 1 year ov w rs & thn.  Patient was given opportunity to ask questions. Patient verbalized understanding of the plan and was able to repeat key elements of the plan. All questions were answered to their satisfaction.   I, [name], MD, have reviewed all documentation for this visit. The documentation on 05/17/24 for the exam, diagnosis, procedures, and orders are all accurate and complete.   IF YOU HAVE BEEN REFERRED TO A SPECIALIST, IT MAY TAKE 1-2 WEEKS TO SCHEDULE/PROCESS THE REFERRAL. IF YOU HAVE NOT HEARD FROM US /SPECIALIST IN TWO WEEKS, PLEASE GIVE US  A CALL AT [phone].   THE PATIENT IS ENCOURAGED TO PRACTICE SOCIAL DISTANCING DUE TO THE COVID-19 PANDEMIC.

## 2024-05-17 NOTE — Progress Notes (Addendum)
 Subjective:   Renee Bradley is a 78 y.o. who presents for a Medicare Wellness preventive visit.  As a reminder, Annual Wellness Visits don't include a physical exam, and some assessments may be limited, especially if this visit is performed virtually. We may recommend an in-person follow-up visit with your provider if needed.  Visit Complete: In person    Persons Participating in Visit: Patient.  AWV Questionnaire: Yes: Patient Medicare AWV questionnaire was completed by the patient on 05/15/2024; I have confirmed that all information answered by patient is correct and no changes since this date.  Cardiac Risk Factors include: advanced age (>54men, >42 women);diabetes mellitus;dyslipidemia;hypertension     Objective:    Today's Vitals   05/17/24 1449  BP: 122/60  Pulse: (!) 58  Temp: 98.4 F (36.9 C)  TempSrc: Oral  SpO2: 95%  Weight: 169 lb 6.4 oz (76.8 kg)  Height: 5' 2 (1.575 m)   Body mass index is 30.98 kg/m.     05/17/2024    2:55 PM 05/13/2023   10:18 AM 11/22/2022   11:54 AM 09/02/2022    9:47 AM 08/12/2022    1:41 PM 07/27/2022   11:09 AM 07/27/2022   12:31 AM  Advanced Directives  Does Patient Have a Medical Advance Directive? No No No Yes No  No  Type of Print production planner of Healthcare Power of Attorney in Chart?    No - copy requested     Would patient like information on creating a medical advance directive?  Yes (MAU/Ambulatory/Procedural Areas - Information given)   No - Patient declined No - Patient declined     Current Medications (verified) Outpatient Encounter Medications as of 05/17/2024  Medication Sig   apixaban  (ELIQUIS ) 5 MG TABS tablet Take 1 tablet (5 mg total) by mouth 2 (two) times daily.   dapagliflozin  propanediol (FARXIGA ) 10 MG TABS tablet Take 1 tablet (10 mg total) by mouth daily.   furosemide  (LASIX ) 20 MG tablet Take 1 tablet (20 mg total) by mouth daily as needed.   Lancets  (ONETOUCH DELICA PLUS LANCET33G) MISC Use as directed to check blood sugars once daily E11.65   loratadine  (CLARITIN ) 10 MG tablet TAKE 1 TABLET BY MOUTH EVERY DAY AS NEEDED FOR ALLERGY   metoprolol  succinate (TOPROL -XL) 100 MG 24 hr tablet TAKE 1 TABLET DAILY   ONETOUCH VERIO test strip Use as directed to check blood sugars once daily E11.65   potassium chloride  SA (KLOR-CON  M) 20 MEQ tablet Take 1 tablet (20 mEq total) by mouth daily as needed. ONLY TAKE POTASSIUM (1) TABLET WHEN YOU TAKE LASIX  (FUROSEMIDE )   rosuvastatin  (CRESTOR ) 40 MG tablet TAKE 1 TABLET DAILY   sacubitril -valsartan  (ENTRESTO ) 24-26 MG Take 1 tablet by mouth 2 (two) times daily.   spironolactone  (ALDACTONE ) 25 MG tablet TAKE 1/2 TABLET(12.5MG      TOTAL) DAILY   No facility-administered encounter medications on file as of 05/17/2024.    Allergies (verified) Janumet [sitagliptin phos-metformin hcl], Januvia [sitagliptin], Kombiglyze [saxagliptin-metformin er], Sulfa antibiotics, and Elemental sulfur   History: Past Medical History:  Diagnosis Date   Asthma    Chest pressure 07/13/2012   Diabetes mellitus without complication (HCC)    DM (diabetes mellitus) (HCC) 07/13/2012   HTN (hypertension) 07/13/2012   Hypercholesterolemia    Hypertension    SOB (shortness of breath) 07/13/2012   Tachycardia, unspecified, fluttering 07/13/2012   Past Surgical History:  Procedure Laterality Date  ABDOMINAL HYSTERECTOMY     CARDIOVERSION N/A 09/02/2022   Procedure: CARDIOVERSION;  Surgeon: Gardenia Led, DO;  Location: [facility];  Service: Cardiovascular;  Laterality: N/A;   TEE WITHOUT CARDIOVERSION N/A 09/02/2022   Procedure: TRANSESOPHAGEAL ECHOCARDIOGRAM (TEE);  Surgeon: Gardenia Led, DO;  Location: [facility];  Service: Cardiovascular;  Laterality: N/A;   Family History  Problem Relation Age of Onset   Heart attack Mother    Diabetes type II Mother    Stroke Mother    Alzheimer's disease Father     Diabetes type II Brother    Social History   Socioeconomic History   Marital status: Married    Spouse name: Not on file   Number of children: 2   Years of education: Not on file   Highest education level: Some college, no degree  Occupational History   Occupation: retired  Tobacco Use   Smoking status: Former    Current packs/day: 0.00    Types: Cigarettes    Start date: 05/12/1981    Quit date: 05/13/1983    Years since quitting: 41.0   Smokeless tobacco: Never  Vaping Use   Vaping status: Never Used  Substance and Sexual Activity   Alcohol use: No   Drug use: No   Sexual activity: Yes  Other Topics Concern   Not on file  Social History Narrative   Not on file   Social Drivers of Health   Financial Resource Strain: Low Risk  (05/15/2024)   Overall Financial Resource Strain (CARDIA)    Difficulty of Paying Living Expenses: Not hard at all  Food Insecurity: No Food Insecurity (05/15/2024)   Hunger Vital Sign    Worried About Running Out of Food in the Last Year: Never true    Ran Out of Food in the Last Year: Never true  Transportation Needs: No Transportation Needs (05/15/2024)   PRAPARE - Administrator, Civil Service (Medical): No    Lack of Transportation (Non-Medical): No  Physical Activity: Inactive (05/15/2024)   Exercise Vital Sign    Days of Exercise per Week: 2 days    Minutes of Exercise per Session: 0 min  Stress: No Stress Concern Present (05/15/2024)   [employer] of Occupational Health - Occupational Stress Questionnaire    Feeling of Stress: Not at all  Social Connections: Moderately Isolated (05/15/2024)   Social Connection and Isolation Panel    Frequency of Communication with Friends and Family: Three times a week    Frequency of Social Gatherings with Friends and Family: Twice a week    Attends Religious Services: Patient declined    Database administrator or Organizations: No    Attends Engineer, structural:  Not on file    Marital Status: Married    Tobacco Counseling Counseling given: Not Answered    Clinical Intake:  Pre-visit preparation completed: Yes  Pain : No/denies pain     Nutritional Status: BMI > 30  Obese Nutritional Risks: None Diabetes: Yes CBG done?: No Did pt. bring in CBG monitor from home?: No  Lab Results  Component Value Date   HGBA1C 5.9 (H) 01/17/2024   HGBA1C 6.0 (H) 09/14/2023   HGBA1C 6.0 (H) 05/13/2023     How often do you need to have someone help you when you read instructions, pamphlets, or other written materials from your doctor or pharmacy?: 1 - Never  Interpreter Needed?: No  Information entered by :: NAllen LPN   Activities of Daily Living  05/17/2024    2:51 PM  In your present state of health, do you have any difficulty performing the following activities:  Hearing? 0  Vision? 1  Comment has a cataract  Difficulty concentrating or making decisions? 0  Walking or climbing stairs? 0  Dressing or bathing? 0  Doing errands, shopping? 0  Preparing Food and eating ? N  Using the Toilet? N  In the past six months, have you accidently leaked urine? N  Do you have problems with loss of bowel control? N  Managing your Medications? N  Managing your Finances? N  Housekeeping or managing your Housekeeping? N    Patient Care Team: Jarold Medici, MD as PCP - General (Internal Medicine) Court Dorn PARAS, MD as PCP - Cardiology [facility], [place]  I have updated your Care Teams any recent Medical Services you may have received from other providers in the past year.     Assessment:   This is a routine wellness examination for Rozina.  Hearing/Vision screen Hearing Screening - Comments:: Denies hearing issues Vision Screening - Comments:: Regular eye exams, Dr. Burnis Amato   Goals Addressed             This Visit's Progress    Patient Stated       05/17/2024, eat in moderation       Depression  Screen     05/17/2024    2:56 PM 01/17/2024    3:39 PM 05/13/2023   10:22 AM 11/30/2022    2:23 PM 04/23/2022   10:18 AM 03/13/2021    3:57 PM 02/07/2020    2:32 PM  PHQ 2/9 Scores  PHQ - 2 Score 0 0 0 0 0 0 0  PHQ- 9 Score 1 0 0 0       Fall Risk     05/17/2024    3:05 PM 05/17/2024    2:56 PM 01/17/2024    3:39 PM 05/13/2023   10:21 AM 11/30/2022    2:23 PM  Fall Risk   Falls in the past year? 0 0 0 0 0  Number falls in past yr: 0 0 0 0 0  Injury with Fall?  0 0 0 0  Risk for fall due to :  Medication side effect No Fall Risks Medication side effect No Fall Risks  Follow up  Falls evaluation completed;Falls prevention discussed Falls evaluation completed Falls prevention discussed;Falls evaluation completed Falls evaluation completed    MEDICARE RISK AT HOME:  Medicare Risk at Home Any stairs in or around the home?: Yes If so, are there any without handrails?: No Home free of loose throw rugs in walkways, pet beds, electrical cords, etc?: Yes Adequate lighting in your home to reduce risk of falls?: Yes Life alert?: No Use of a cane, walker or w/c?: No Grab bars in the bathroom?: Yes Shower chair or bench in shower?: No Elevated toilet seat or a handicapped toilet?: Yes  TIMED UP AND GO:  Was the test performed?  Yes  Length of time to ambulate 10 feet: 5 sec Gait steady and fast without use of assistive device  Cognitive Function: 6CIT completed        05/17/2024    2:57 PM 05/13/2023   10:24 AM 04/23/2022   10:19 AM 03/13/2021    3:58 PM 02/07/2020    2:34 PM  6CIT Screen  What Year? 0 points 0 points 0 points 0 points 0 points  What month? 0 points 0 points 0 points  0 points 0 points  What time? 0 points 0 points 0 points 0 points 0 points  Count back from 20 0 points 0 points 0 points 0 points 0 points  Months in reverse 0 points 0 points 0 points 0 points 0 points  Repeat phrase 4 points 0 points 4 points 4 points 4 points  Total Score 4 points 0 points 4  points 4 points 4 points    Immunizations Immunization History  Administered Date(s) Administered   DTaP 07/11/2013   Fluad Quad(high Dose 65+) 05/31/2020, 07/17/2021   Fluad Trivalent(High Dose 65+) 05/13/2023   INFLUENZA, HIGH DOSE SEASONAL PF 06/14/2018, 05/16/2019, 05/17/2024   PFIZER(Purple Top)SARS-COV-2 Vaccination 10/13/2019, 11/03/2019   Pfizer Covid-19 Vaccine Bivalent Booster 40yrs & up 08/28/2021    Screening Tests Health Maintenance  Topic Date Due   COVID-19 Vaccine (4 - 2025-26 season) 04/03/2024   FOOT EXAM  05/12/2024   Zoster Vaccines- Shingrix (1 of 2) 08/17/2024 (Originally 02/09/1965)   DTaP/Tdap/Td (2 - Tdap) 01/16/2025 (Originally 07/12/2023)   Pneumococcal Vaccine: 50+ Years (1 of 2 - PCV) 01/16/2025 (Originally 02/09/1965)   OPHTHALMOLOGY EXAM  06/07/2024   HEMOGLOBIN A1C  07/18/2024   Diabetic kidney evaluation - Urine ACR  02/22/2025   Diabetic kidney evaluation - eGFR measurement  03/22/2025   Medicare Annual Wellness (AWV)  05/17/2025   Influenza Vaccine  Completed   DEXA SCAN  Completed   Hepatitis C Screening  Completed   Meningococcal B Vaccine  Aged Out   Mammogram  Discontinued   Fecal DNA (Cologuard)  Discontinued    Health Maintenance Items Addressed: Vaccines Given today: flu  Additional Screening:  Vision Screening: Recommended annual ophthalmology exams for early detection of glaucoma and other disorders of the eye. Is the patient up to date with their annual eye exam?  Yes  Who is the provider or what is the name of the office in which the patient attends annual eye exams? Dr[facility]  Dental Screening: Recommended annual dental exams for proper oral hygiene  Community Resource Referral / Chronic Care Management: CRR required this visit?  No   CCM required this visit?  No   Plan:    I have personally reviewed and noted the following in the patient's chart:   Medical and social history Use of alcohol, tobacco or  illicit drugs  Current medications and supplements including opioid prescriptions. Patient is not currently taking opioid prescriptions. Functional ability and status Nutritional status Physical activity Advanced directives List of other physicians Hospitalizations, surgeries, and ER visits in previous 12 months Vitals Screenings to include cognitive, depression, and falls Referrals and appointments  In addition, I have reviewed and discussed with patient certain preventive protocols, quality metrics, and best practice recommendations. A written personalized care plan for preventive services as well as general preventive health recommendations were provided to patient.   Ardella FORBES Dawn, LPN   [id number]   After Visit Summary: (In Person-Printed) AVS printed and given to the patient  Notes: Nothing significant to report at this time.

## 2024-05-18 LAB — HEPATIC FUNCTION PANEL
ALT: 15 IU/L (ref 0–32)
AST: 18 IU/L (ref 0–40)
Albumin: 4.1 g/dL (ref 3.8–4.8)
Alkaline Phosphatase: 67 IU/L (ref 49–135)
Bilirubin Total: 0.9 mg/dL (ref 0.0–1.2)
Bilirubin, Direct: 0.27 mg/dL (ref 0.00–0.40)
Total Protein: 6.2 g/dL (ref 6.0–8.5)

## 2024-05-18 LAB — LIPID PANEL
Chol/HDL Ratio: 2.5 ratio (ref 0.0–4.4)
Cholesterol, Total: 167 mg/dL (ref 100–199)
HDL: 68 mg/dL (ref >39)
LDL Chol Calc (NIH): 81 mg/dL (ref 0–99)
Triglycerides: 100 mg/dL (ref 0–149)
VLDL Cholesterol Cal: 18 mg/dL (ref 5–40)

## 2024-05-18 LAB — HEMOGLOBIN A1C
Est. average glucose Bld gHb Est-mCnc: 126 mg/dL
Hgb A1c MFr Bld: 6 % — ABNORMAL HIGH (ref 4.8–5.6)

## 2024-05-20 ENCOUNTER — Ambulatory Visit: Payer: Self-pay | Admitting: Internal Medicine

## 2024-05-21 NOTE — Assessment & Plan Note (Signed)
 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 Regular follow-up with nephrologist Dr. Melia is ongoing. - Follow up with nephrologist Dr. Melia as scheduled. - Continue with furosemide  20mg  daily, metoprolol  XL 100mg  daily, spironolactone  25mg  1/2 tab daily and entresto  24/26mg  twice daily - Follow low sodium diet.

## 2024-05-21 NOTE — Assessment & Plan Note (Signed)
 Her BMI is acceptable for her demographic.  She is advised to aim for at least 150 minutes of exercise per week.

## 2024-05-21 NOTE — Assessment & Plan Note (Signed)
 Chronic, appears to be euvolemic at this time. She will continue with metoprolol XL 100mg , spironolactone 25mg , Farxiga 10mg , furosemide 20mg  and Entresto 24/26mg  as per Cardiology.

## 2024-05-21 NOTE — Assessment & Plan Note (Addendum)
 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 Blood glucose levels are well-controlled. Recent A1c levels are under control. - Order A1c test to monitor diabetes control. - Continue with Farxiga  daily - Follow dietary recommendations - Continue current diabetes management plan.

## 2024-05-21 NOTE — Assessment & Plan Note (Signed)
 Hypercholesterolemia managed with rosuvastatin . LDL levels need to be maintained below 70 due to diabetes. Last LDL was 76, indicating a need for re-evaluation. - Order lipid panel to recheck LDL levels. - Continue rosuvastatin  therapy.

## 2024-06-22 DIAGNOSIS — N1832 Chronic kidney disease, stage 3b: Secondary | ICD-10-CM | POA: Diagnosis not present

## 2024-06-22 DIAGNOSIS — I5020 Unspecified systolic (congestive) heart failure: Secondary | ICD-10-CM | POA: Diagnosis not present

## 2024-06-22 DIAGNOSIS — E1122 Type 2 diabetes mellitus with diabetic chronic kidney disease: Secondary | ICD-10-CM | POA: Diagnosis not present

## 2024-06-22 DIAGNOSIS — I4891 Unspecified atrial fibrillation: Secondary | ICD-10-CM | POA: Diagnosis not present

## 2024-06-22 DIAGNOSIS — E785 Hyperlipidemia, unspecified: Secondary | ICD-10-CM | POA: Diagnosis not present

## 2024-07-18 ENCOUNTER — Other Ambulatory Visit (HOSPITAL_COMMUNITY): Payer: Self-pay | Admitting: Family Medicine

## 2024-07-20 ENCOUNTER — Other Ambulatory Visit (HOSPITAL_COMMUNITY): Payer: Self-pay | Admitting: Internal Medicine

## 2024-07-20 ENCOUNTER — Other Ambulatory Visit (HOSPITAL_COMMUNITY): Payer: Self-pay

## 2024-07-20 MED ORDER — SPIRONOLACTONE 25 MG PO TABS: 12.5000 mg | ORAL_TABLET | Freq: Once | ORAL | 3 refills | Status: DC

## 2024-07-20 MED ORDER — METOPROLOL SUCCINATE ER 100 MG PO TB24: 100.0000 mg | ORAL_TABLET | Freq: Every day | ORAL | 1 refills | Status: AC

## 2024-08-04 ENCOUNTER — Telehealth: Payer: Self-pay | Admitting: Cardiovascular Disease

## 2024-08-04 NOTE — Telephone Encounter (Signed)
 Called and spoke to patient. Per patient she reported she took extra dose of Furosemide . Patient verbalized she feels fine. Denies lightheadedness or dizziness. Made patient aware to continue to stay hydrated. Made pat aware of ED precautions and pat verbalized an understanding. Made patient aware to call office for any other questions. Patient verbalized an understanding

## 2024-08-04 NOTE — Telephone Encounter (Signed)
 Pt c/o medication issue:  1. Name of Medication:   furosemide  (LASIX ) 20 MG tablet    2. How are you currently taking this medication (dosage and times per day)?   3. Are you having a reaction (difficulty breathing--STAT)?   4. What is your medication issue?   Patient says she accidentally took 2 Furosemide  20 MG this morning at 8:00 AM. Please advise.

## 2024-08-18 ENCOUNTER — Encounter (HOSPITAL_BASED_OUTPATIENT_CLINIC_OR_DEPARTMENT_OTHER): Payer: Self-pay

## 2024-08-18 ENCOUNTER — Other Ambulatory Visit: Payer: Self-pay

## 2024-08-18 ENCOUNTER — Emergency Department (HOSPITAL_BASED_OUTPATIENT_CLINIC_OR_DEPARTMENT_OTHER)
Admission: EM | Admit: 2024-08-18 | Discharge: 2024-08-18 | Disposition: A | Discharge status: Home / Self Care | Attending: Emergency Medicine | Admitting: Emergency Medicine

## 2024-08-18 DIAGNOSIS — M546 Pain in thoracic spine: Secondary | ICD-10-CM | POA: Diagnosis present

## 2024-08-18 DIAGNOSIS — Z7901 Long term (current) use of anticoagulants: Secondary | ICD-10-CM | POA: Insufficient documentation

## 2024-08-18 MED ORDER — KETOROLAC TROMETHAMINE 15 MG/ML IJ SOLN
15.0000 mg | Freq: Once | INTRAMUSCULAR | Status: AC
  Administered 2024-08-18: 15 mg via INTRAMUSCULAR
  Filled 2024-08-18: qty 1

## 2024-08-18 MED ORDER — METHYLPREDNISOLONE 4 MG PO TBPK: ORAL_TABLET | ORAL | 0 refills | Status: AC

## 2024-08-18 MED ORDER — ACETAMINOPHEN 500 MG PO TABS
1000.0000 mg | ORAL_TABLET | Freq: Once | ORAL | Status: AC
  Administered 2024-08-18: 1000 mg via ORAL
  Filled 2024-08-18: qty 2

## 2024-08-18 MED ORDER — DICLOFENAC SODIUM 1 % EX GEL: 4.0000 g | Freq: Four times a day (QID) | CUTANEOUS | 0 refills | Status: AC

## 2024-08-18 NOTE — ED Triage Notes (Signed)
 Pt c/o shooting L arm/ shoulder pain onset yesterday, relieved w rest. Advises pain is worse w movement, worse upon waking. Advises sometimes I can feel a little pain up by my neck  Denies injury, numbness, tingling, focal deficit.

## 2024-08-18 NOTE — ED Provider Notes (Signed)
 " [place] EMERGENCY DEPARTMENT AT [facility] Provider Note   CSN: [id number] Arrival date & time: 08/18/24  1512     Patient presents with: Arm Pain (R)   Renee Bradley is a 79 y.o. female.   79 yo F with right sided pain.  Going on for a couple days.  She denies any inciting event.  Denies trauma.  Feels it all the way up to the base of her neck and down to her rib margin feels it sometimes shoot down her arm.  Denies weakness to the arm.  Denies rash.   Arm Pain       Prior to Admission medications  Medication Sig Start Date End Date Taking? Authorizing Provider  diclofenac  Sodium (VOLTAREN ) 1 % GEL Apply 4 g topically 4 (four) times daily. 08/18/24  Yes Emil Share, DO  methylPREDNISolone  (MEDROL  DOSEPAK) 4 MG TBPK tablet Day 1: 8mg  before breakfast, 4 mg after lunch, 4 mg after supper, and 8 mg at bedtime Day 2: 4 mg before breakfast, 4 mg after lunch, 4 mg  after supper, and 8 mg  at bedtime Day 3:  4 mg  before breakfast, 4 mg  after lunch, 4 mg after supper, and 4 mg  at bedtime Day 4: 4 mg  before breakfast, 4 mg  after lunch, and 4 mg at bedtime Day 5: 4 mg  before breakfast and 4 mg at bedtime Day 6: 4 mg  before breakfast 08/18/24  Yes Taraneh Metheney, DO  apixaban  (ELIQUIS ) 5 MG TABS tablet Take 1 tablet (5 mg total) by mouth 2 (two) times daily. 02/22/24   Jarold Medici, MD  dapagliflozin  propanediol (FARXIGA ) 10 MG TABS tablet Take 1 tablet (10 mg total) by mouth daily. 01/17/24   Jarold Medici, MD  furosemide  (LASIX ) 20 MG tablet Take 1 tablet (20 mg total) by mouth daily as needed. 04/18/24   Jarold Medici, MD  Lancets [facility] DELICA PLUS [place]) MISC Use as directed to check blood sugars once daily E11.65 01/17/24   Jarold Medici, MD  loratadine  (CLARITIN ) 10 MG tablet TAKE 1 TABLET BY MOUTH EVERY DAY AS NEEDED FOR ALLERGY 06/09/23   Jarold Medici, MD  metoprolol  succinate (TOPROL -XL) 100 MG 24 hr tablet Take 1 tablet (100 mg total) by mouth daily. 07/20/24    Bensimhon, Toribio SAUNDERS, MD  [facility] VERIO test strip Use as directed to check blood sugars once daily E11.65 01/17/24   Jarold Medici, MD  potassium chloride  SA (KLOR-CON  M) 20 MEQ tablet Take 1 tablet (20 mEq total) by mouth daily as needed. ONLY TAKE POTASSIUM (1) TABLET WHEN YOU TAKE LASIX  (FUROSEMIDE ) 05/14/23   Sabharwal, Aditya, DO  rosuvastatin  (CRESTOR ) 40 MG tablet TAKE 1 TABLET DAILY 10/22/23   Colletta Manuelita Garre, PA-C  sacubitril -valsartan  (ENTRESTO ) 24-26 MG Take 1 tablet by mouth 2 (two) times daily. PLEASE REFER TO GENERAL CARDIOLOGY FOR FUTURE REFILLS 07/19/24   Glena Harlene HERO, FNP  spironolactone  (ALDACTONE ) 25 MG tablet Take 0.5 tablets (12.5 mg total) by mouth daily. 07/20/24   Bensimhon, Toribio SAUNDERS, MD    Allergies: Janumet [sitagliptin phos-metformin hcl], Januvia [sitagliptin], Kombiglyze [saxagliptin-metformin er], Sulfa antibiotics, and Elemental sulfur    Review of Systems  Updated Vital Signs BP 118/71   Pulse (!) 58   Temp 98.6 F (37 C)   Resp 16   SpO2 94%   Physical Exam Vitals and nursing note reviewed.  Constitutional:      General: She is not in acute distress.  Appearance: She is well-developed. She is not diaphoretic.  HENT:     Head: Normocephalic and atraumatic.  Eyes:     Pupils: Pupils are equal, round, and reactive to light.  Cardiovascular:     Rate and Rhythm: Normal rate and regular rhythm.     Heart sounds: No murmur heard.    No friction rub. No gallop.  Pulmonary:     Effort: Pulmonary effort is normal.     Breath sounds: No wheezing or rales.  Abdominal:     General: There is no distension.     Palpations: Abdomen is soft.     Tenderness: There is no abdominal tenderness.  Musculoskeletal:        General: Tenderness present.     Cervical back: Normal range of motion and neck supple.     Comments: Patient has a focal tender points between the tip of the scapula and the T-spine.  Reproduces her discomfort.  Pulse motor  and sensation intact to the right upper extremity.  No appreciable weakness.  Skin:    General: Skin is warm and dry.  Neurological:     Mental Status: She is alert and oriented to person, place, and time.  Psychiatric:        Behavior: Behavior normal.     (all labs ordered are listed, but only abnormal results are displayed) Labs Reviewed - No data to display  EKG: None  Radiology: No results found.   Procedures   Medications Ordered in the ED  ketorolac  (TORADOL ) 15 MG/ML injection 15 mg (15 mg Intramuscular Given 08/18/24 1635)  acetaminophen  (TYLENOL ) tablet 1,000 mg (1,000 mg Oral Given 08/18/24 1634)                                    Medical Decision Making Risk OTC drugs. Prescription drug management.   79 yo F with a chief complaints of right sided pain.  By history and exam this is most likely musculoskeletal.  She has a pinpoint tender spot about her right thoracic paraspinal musculature.  Will treat supportively.  6:36 PM:  I have discussed the diagnosis/risks/treatment options with the patient and family.  Evaluation and diagnostic testing in the emergency department does not suggest an emergent condition requiring admission or immediate intervention beyond what has been performed at this time.  They will follow up with PCP. We also discussed returning to the ED immediately if new or worsening sx occur. We discussed the sx which are most concerning (e.g., sudden worsening pain, fever, inability to tolerate by mouth) that necessitate immediate return. Medications administered to the patient during their visit and any new prescriptions provided to the patient are listed below.  Medications given during this visit Medications  ketorolac  (TORADOL ) 15 MG/ML injection 15 mg (15 mg Intramuscular Given 08/18/24 1635)  acetaminophen  (TYLENOL ) tablet 1,000 mg (1,000 mg Oral Given 08/18/24 1634)     The patient appears reasonably screen and/or stabilized for discharge  and I doubt any other medical condition or other [facility] requiring further screening, evaluation, or treatment in the ED at this time prior to discharge.       Final diagnoses:  Acute right-sided thoracic back pain    ED Discharge Orders          Ordered    methylPREDNISolone  (MEDROL  DOSEPAK) 4 MG TBPK tablet        08/18/24 1626    diclofenac   Sodium (VOLTAREN ) 1 % GEL  4 times daily        08/18/24 1626               Emil Share, DO 08/18/24 1836  "

## 2024-08-18 NOTE — Discharge Instructions (Signed)
 Take the steroids as prescribed.  Use the gel as prescribed. Also take tylenol  1000mg (2 extra strength) four times a day. Please call your doctor on Monday and let them know about your visit today.  See when they can see you in the office.  Please return for worsening pain if you cough up blood or feel you are going to pass out.

## 2024-08-19 ENCOUNTER — Other Ambulatory Visit (HOSPITAL_COMMUNITY): Payer: Self-pay | Admitting: Physician Assistant

## 2024-08-21 ENCOUNTER — Ambulatory Visit: Payer: Self-pay

## 2024-08-21 NOTE — Telephone Encounter (Signed)
 FYI Only or Action Required?: FYI only for provider: appointment scheduled on 08/22/24.  Patient was last seen in primary care on 05/17/2024 by Jarold Medici, MD.  Called Nurse Triage reporting Arm Pain.  Symptoms began several days ago.  Interventions attempted: OTC medications: Extra strength tylenol  and Prescription medications: methylprednisolone .  Symptoms are: unchanged.  Triage Disposition: See Physician Within 24 Hours (overriding See HCP Within 4 Hours (Or PCP Triage))  Patient/caregiver understands and will follow disposition?: Yes  Reason for Disposition  [1] Arm pains with exertion (e.g., walking) AND [2] pain goes away on resting AND [3] not present now  Answer Assessment - Initial Assessment Questions Seen in ED 08/18/24 for these symptoms By history and exam this is most likely musculoskeletal. Taking extra strength tylenol  as needed and methylPREDNISolone  (MEDROL  DOSEPAK) 4 MG TBPK tablet.  prescribed diclofenac  Sodium (VOLTAREN ) 1 % GEL but not using  1. ONSET: When did the pain start?     3 days ago  2. LOCATION: Where is the pain located?     Right arm, radiates into the hand, right side of neck and into right shoulder blade  3. PAIN: How bad is the pain? (Scale 0-10; or none, mild, moderate, severe)     0/10 when sitting still, when gets up to walk around its a screaming 10/10. Pt reports being able to lift right arm up with no pain.  4. CAUSE: What do you think is causing the arm pain?     Unsure  5. OTHER SYMPTOMS: Do you have any other symptoms? (e.g., neck pain, swelling, rash, fever, numbness, weakness)     Mild numbness on right ring finger and middle finger, denies swelling rash or weakness  Protocols used: Arm Pain-A-AH  Message from Jasmin G sent at 08/21/2024 12:06 PM EST  Reason for Triage: Pt has been experiencing really intense pain from right arm down to hand, back of neck and right shoulder blade since Friday.

## 2024-08-22 ENCOUNTER — Ambulatory Visit

## 2024-08-22 VITALS — BP 130/70 | HR 55 | Temp 98.6°F | Ht 62.0 in | Wt 165.0 lb

## 2024-08-22 DIAGNOSIS — M25511 Pain in right shoulder: Secondary | ICD-10-CM

## 2024-08-22 DIAGNOSIS — M546 Pain in thoracic spine: Secondary | ICD-10-CM

## 2024-08-22 DIAGNOSIS — M778 Other enthesopathies, not elsewhere classified: Secondary | ICD-10-CM

## 2024-08-22 DIAGNOSIS — R200 Anesthesia of skin: Secondary | ICD-10-CM

## 2024-08-22 DIAGNOSIS — M79601 Pain in right arm: Secondary | ICD-10-CM

## 2024-08-22 NOTE — Progress Notes (Deleted)
 "     Patient: Renee Bradley, Female    DOB: Jun 11, 1946, 79 y.o.   MRN: [id number]  Subjective  Chief Complaint  Patient presents with   Arm Pain    Patient presents today for a urgent care follow up. She reports she went to the urgent care on 05/17/25 for right arm pain. She was told to take tylenol  and given a prescription of methylprednisolone . She reports she is not taking the tylenol . She reports the pain is unchanged and it comes and goes. She reports the pain is worse when she is using her arm.     Renee Bradley is a 79 y.o. female who presents today for her Annual Wellness Visit. She reports consuming a  diet.  She generally feels . She reports sleeping . She  have additional problems to discuss today.         Medications: Show/hide medication list[1]  Allergies[2]  Patient Care Team: Jarold Medici, MD as PCP - General (Internal Medicine) Court Dorn PARAS, MD as PCP - Cardiology (Cardiology) Center, [facility]  Review of Systems  Musculoskeletal:  Positive for back pain, joint pain and myalgias.  Neurological:        Numbness over the right distal 3rd/4th fingers        Objective  BP 130/70   Pulse (!) 55   Temp 98.6 F (37 C) (Oral)   Ht 5' 2 (1.575 m)   Wt 165 lb (74.8 kg)   BMI 30.18 kg/m    Physical Exam Constitutional:      Appearance: Normal appearance.  Neck:     Comments: Passive range of motion of the cervical spine: Intact forward flexion/extension restriction with sidebending to the right. Musculoskeletal:     Right shoulder: Tenderness present.     Right elbow: Tenderness present.     Comments: Passive range of motion of the right shoulder: Increased tenderness over the right anterior AC joint with internal rotation.  Intact flexion/extension, external rotation. Tenderness with palpation over the right lateral epicondyle.  No noted erythema or edema over the right elbow with inspection.  Neurological:     Mental Status: She is  alert.     Most recent depression screenings:    05/17/2024    2:56 PM 01/17/2024    3:39 PM  PHQ 2/9 Scores  PHQ - 2 Score 0 0  PHQ- 9 Score 1  0      Data saved with a previous flowsheet row definition    Visit info / Clinical Intake: Interpreter Needed?: No  Functional Status Activities of Daily Living (to include ambulation/medication): Independent Ambulation: Independent Medication Administration: Independent Home Management (perform basic housework or laundry): Independent  Fall Screening Falls in the past year?: 0 Number of falls in past year: 0 Was there an injury with Fall?: 0 Fall Risk Category Calculator: 0 Patient Fall Risk Level: High fall risk  Fall Risk Patient at Risk for Falls Due to: Medication side effect Fall risk Follow up: Falls evaluation completed; Falls prevention discussed  Cognitive Assessment What year is it?: 0 points What month is it?: 0 points Give patient an address phrase to remember (5 components): [address] [place]  About what time is it?: 0 points Count backwards from 20 to 1: 0 points Say the months of the year in reverse: 0 points Repeat the address phrase from earlier: 4 points 6 CIT Score: 4 points  Advance Directives (For Healthcare) Does Patient Have a Medical Advance  Directive?: No    Vision/Hearing Screen: No results found.    No results found for any visits on 08/22/24.    Assessment & Plan   Annual wellness visit done today including the all of the following: Reviewed patient's Family and Medical History Reviewed and updated list of patient's medical providers Assessment of cognitive impairment was done Assessed patient's functional ability Established a written schedule for health screening services Health Risk Assessent Completed and Reviewed  Exercise Activities and Dietary recommendations  Goals       I am concerned about my double vision and drooping eyelids (pt-stated)      Care  Coordination Interventions: Evaluation of current treatment plan related to changes in vision and drooping eye lids and patient's adherence to plan as established by provider Determined patient discontinued her statin over the past month due to experiencing intermittent change in vision with eyelid drooping Discussed PCP recommendations to continue taking her statin as directed and contact her doctor to report symptoms of impaired vision  Determined patient will follow PCP recommendations          I need to eat better to help lower my A1c (pt-stated)      Care Coordination Interventions: Provided education to patient about basic DM disease process Review of patient status, including review of consultants reports, relevant laboratory and other test results, and medications completed Educated patient on target A1c Educated on dietary and exercise recommendations Educated patient regarding the provider exercise referral program (PREP), patient declines Mailed printed educational materials related to Diabetes Management          I want to work on lowering my LDL (pt-stated)      Care Coordination Interventions: Provider established cholesterol goals reviewed Counseled on importance of regular laboratory monitoring as prescribed Provided HLD educational materials Reviewed role and benefits of statin for ASCVD risk reduction Reviewed importance of limiting foods high in cholesterol Reviewed exercise goals and target of 150 minutes per week Educated patient related to increasing her daily fiber intake Sent referral to Pharm D regarding patient's request to consider alternative therapies to help manage her LDL        Patient Stated      Keep doing the best she can      Patient Stated      02/07/2020, work on exercising and cut down on potato chip intake      Patient Stated      03/13/2021, eat healthy      Patient Stated      04/23/2022, wants to find a part time job      Patient  Stated      05/13/2023, try to continue improve diet and watch intake, eat no later than 5p      Patient Stated      05/17/2024, eat in moderation      To keep my heart rate in control (pt-stated)      Care Coordination Interventions: Evaluation of current treatment plan related to Atrial fib and patient's adherence to plan as established by provider Reviewed importance of adherence to anticoagulant exactly as prescribed Advised patient to discuss changes in heart rhythm, and or shortness of breath  with provider Counseled on bleeding risk associated with Eliquis  and importance of self-monitoring for signs/symptoms of bleeding Counseled on avoidance of NSAIDs due to increased bleeding risk with anticoagulants Counseled on importance of regular laboratory monitoring as prescribed Counseled on seeking medical attention after a head injury or if there is blood in  the urine/stool Afib action plan reviewed           Immunization History  Administered Date(s) Administered   DTaP 07/11/2013   Fluad Quad(high Dose 65+) 05/31/2020, 07/17/2021   Fluad Trivalent(High Dose 65+) 05/13/2023   INFLUENZA, HIGH DOSE SEASONAL PF 06/14/2018, 05/16/2019, 05/17/2024   PFIZER(Purple Top)SARS-COV-2 Vaccination 10/13/2019, 11/03/2019   Pfizer Covid-19 Vaccine Bivalent Booster 54yrs & up 08/28/2021    Health Maintenance  Topic Date Due   Zoster Vaccines- Shingrix (1 of 2) Never done   COVID-19 Vaccine (4 - 2025-26 season) 04/03/2024   FOOT EXAM  05/12/2024   OPHTHALMOLOGY EXAM  06/07/2024   DTaP/Tdap/Td (2 - Tdap) 01/16/2025 (Originally 07/12/2023)   Pneumococcal Vaccine: 50+ Years (1 of 2 - PCV) 01/16/2025 (Originally 02/09/1966)   Diabetic kidney evaluation - Urine ACR  08/25/2024   HEMOGLOBIN A1C  11/15/2024   Diabetic kidney evaluation - eGFR measurement  03/22/2025   Medicare Annual Wellness (AWV)  05/17/2025   Influenza Vaccine  Completed   Bone Density Scan  Completed   Hepatitis C  Screening  Completed   Meningococcal B Vaccine  Aged Out   Mammogram  Discontinued   Fecal DNA (Cologuard)  Discontinued     Discussed health benefits of physical activity, and encouraged her to engage in regular exercise appropriate for her age and condition.    Problem List Items Addressed This Visit   None Visit Diagnoses       Right arm pain    -  Primary     Acute pain of right shoulder         Tendonitis of elbow, right         Acute right-sided thoracic back pain         Right arm numbness           Return if symptoms worsen or fail to improve.     Gaither JONELLE Fischer, DO       [1]  Outpatient Medications Prior to Visit  Medication Sig   apixaban  (ELIQUIS ) 5 MG TABS tablet Take 1 tablet (5 mg total) by mouth 2 (two) times daily.   dapagliflozin  propanediol (FARXIGA ) 10 MG TABS tablet Take 1 tablet (10 mg total) by mouth daily.   diclofenac  Sodium (VOLTAREN ) 1 % GEL Apply 4 g topically 4 (four) times daily.   furosemide  (LASIX ) 20 MG tablet Take 1 tablet (20 mg total) by mouth daily as needed.   Lancets (ONETOUCH DELICA PLUS LANCET33G) MISC Use as directed to check blood sugars once daily E11.65   loratadine  (CLARITIN ) 10 MG tablet TAKE 1 TABLET BY MOUTH EVERY DAY AS NEEDED FOR ALLERGY   methylPREDNISolone  (MEDROL  DOSEPAK) 4 MG TBPK tablet Day 1: 8mg  before breakfast, 4 mg after lunch, 4 mg after supper, and 8 mg at bedtime Day 2: 4 mg before breakfast, 4 mg after lunch, 4 mg  after supper, and 8 mg  at bedtime Day 3:  4 mg  before breakfast, 4 mg  after lunch, 4 mg after supper, and 4 mg  at bedtime Day 4: 4 mg  before breakfast, 4 mg  after lunch, and 4 mg at bedtime Day 5: 4 mg  before breakfast and 4 mg at bedtime Day 6: 4 mg  before breakfast   metoprolol  succinate (TOPROL -XL) 100 MG 24 hr tablet Take 1 tablet (100 mg total) by mouth daily.   ONETOUCH VERIO test strip Use as directed to check blood sugars once daily E11.65   potassium  chloride SA (KLOR-CON  M) 20 MEQ  tablet Take 1 tablet (20 mEq total) by mouth daily as needed. ONLY TAKE POTASSIUM (1) TABLET WHEN YOU TAKE LASIX  (FUROSEMIDE )   rosuvastatin  (CRESTOR ) 40 MG tablet TAKE 1 TABLET DAILY   sacubitril -valsartan  (ENTRESTO ) 24-26 MG Take 1 tablet by mouth 2 (two) times daily. PLEASE REFER TO GENERAL CARDIOLOGY FOR FUTURE REFILLS   spironolactone  (ALDACTONE ) 25 MG tablet Take 0.5 tablets (12.5 mg total) by mouth daily.   No facility-administered medications prior to visit.  [2]  Allergies Allergen Reactions   Janumet [Sitagliptin Phos-Metformin Hcl] Shortness Of Breath   Januvia [Sitagliptin] Shortness Of Breath   Kombiglyze [Saxagliptin-Metformin Er] Shortness Of Breath   Sulfa Antibiotics Shortness Of Breath   Elemental Sulfur Itching   "

## 2024-08-22 NOTE — Progress Notes (Deleted)
 "     Patient: Renee Bradley, Female    DOB: 04-05-1946, 79 y.o.   MRN: [id number]  Subjective  Chief Complaint  Patient presents with   Arm Pain    Patient presents today for a urgent care follow up. She reports she went to the urgent care on 05/17/25 for right arm pain. She was told to take tylenol  and given a prescription of methylprednisolone . She reports she is not taking the tylenol . She reports the pain is unchanged and it comes and goes. She reports the pain is worse when she is using her arm.     Renee Bradley is a 79 y.o. female who presents today for her Annual Wellness Visit. She reports consuming a  diet.  She generally feels . She reports sleeping . She  have additional problems to discuss today.         Medications: Show/hide medication list[1]  Allergies[2]  Patient Care Team: Jarold Medici, MD as PCP - General (Internal Medicine) Court Dorn PARAS, MD as PCP - Cardiology (Cardiology) Center, [facility]  Review of Systems  Musculoskeletal:  Positive for back pain, joint pain and myalgias.  Neurological:        Numbness over the right distal 3rd/4th fingers        Objective  BP 130/70   Pulse (!) 55   Temp 98.6 F (37 C) (Oral)   Ht 5' 2 (1.575 m)   Wt 165 lb (74.8 kg)   BMI 30.18 kg/m    Physical Exam Constitutional:      Appearance: Normal appearance.  Neck:     Comments: Passive range of motion of the cervical spine: Intact forward flexion/extension restriction with sidebending to the right. Musculoskeletal:     Right shoulder: Tenderness present.     Right elbow: Tenderness present.     Comments: Passive range of motion of the right shoulder: Increased tenderness over the right anterior AC joint with internal rotation.  Intact flexion/extension, external rotation. Tenderness with palpation over the right lateral epicondyle.  No noted erythema or edema over the right elbow with inspection.  Neurological:     Mental Status: She is  alert.     Most recent depression screenings:    05/17/2024    2:56 PM 01/17/2024    3:39 PM  PHQ 2/9 Scores  PHQ - 2 Score 0 0  PHQ- 9 Score 1  0      Data saved with a previous flowsheet row definition    Visit info / Clinical Intake: Interpreter Needed?: No  Functional Status Activities of Daily Living (to include ambulation/medication): Independent Ambulation: Independent Medication Administration: Independent Home Management (perform basic housework or laundry): Independent  Fall Screening Falls in the past year?: 0 Number of falls in past year: 0 Was there an injury with Fall?: 0 Fall Risk Category Calculator: 0 Patient Fall Risk Level: High fall risk  Fall Risk Patient at Risk for Falls Due to: Medication side effect Fall risk Follow up: Falls evaluation completed; Falls prevention discussed  Cognitive Assessment What year is it?: 0 points What month is it?: 0 points Give patient an address phrase to remember (5 components): [address] [place]  About what time is it?: 0 points Count backwards from 20 to 1: 0 points Say the months of the year in reverse: 0 points Repeat the address phrase from earlier: 4 points 6 CIT Score: 4 points  Advance Directives (For Healthcare) Does Patient Have a Medical Advance  Directive?: No    Vision/Hearing Screen: No results found.    No results found for any visits on 08/22/24.    Assessment & Plan   Annual wellness visit done today including the all of the following: Reviewed patient's Family and Medical History Reviewed and updated list of patient's medical providers Assessment of cognitive impairment was done Assessed patient's functional ability Established a written schedule for health screening services Health Risk Assessent Completed and Reviewed  Exercise Activities and Dietary recommendations  Goals       I am concerned about my double vision and drooping eyelids (pt-stated)      Care  Coordination Interventions: Evaluation of current treatment plan related to changes in vision and drooping eye lids and patient's adherence to plan as established by provider Determined patient discontinued her statin over the past month due to experiencing intermittent change in vision with eyelid drooping Discussed PCP recommendations to continue taking her statin as directed and contact her doctor to report symptoms of impaired vision  Determined patient will follow PCP recommendations          I need to eat better to help lower my A1c (pt-stated)      Care Coordination Interventions: Provided education to patient about basic DM disease process Review of patient status, including review of consultants reports, relevant laboratory and other test results, and medications completed Educated patient on target A1c Educated on dietary and exercise recommendations Educated patient regarding the provider exercise referral program (PREP), patient declines Mailed printed educational materials related to Diabetes Management          I want to work on lowering my LDL (pt-stated)      Care Coordination Interventions: Provider established cholesterol goals reviewed Counseled on importance of regular laboratory monitoring as prescribed Provided HLD educational materials Reviewed role and benefits of statin for ASCVD risk reduction Reviewed importance of limiting foods high in cholesterol Reviewed exercise goals and target of 150 minutes per week Educated patient related to increasing her daily fiber intake Sent referral to Pharm D regarding patient's request to consider alternative therapies to help manage her LDL        Patient Stated      Keep doing the best she can      Patient Stated      02/07/2020, work on exercising and cut down on potato chip intake      Patient Stated      03/13/2021, eat healthy      Patient Stated      04/23/2022, wants to find a part time job       Patient Stated      05/13/2023, try to continue improve diet and watch intake, eat no later than 5p      Patient Stated      05/17/2024, eat in moderation      To keep my heart rate in control (pt-stated)      Care Coordination Interventions: Evaluation of current treatment plan related to Atrial fib and patient's adherence to plan as established by provider Reviewed importance of adherence to anticoagulant exactly as prescribed Advised patient to discuss changes in heart rhythm, and or shortness of breath  with provider Counseled on bleeding risk associated with Eliquis  and importance of self-monitoring for signs/symptoms of bleeding Counseled on avoidance of NSAIDs due to increased bleeding risk with anticoagulants Counseled on importance of regular laboratory monitoring as prescribed Counseled on seeking medical attention after a head injury or if there is blood in  the urine/stool Afib action plan reviewed           Immunization History  Administered Date(s) Administered   DTaP 07/11/2013   Fluad Quad(high Dose 65+) 05/31/2020, 07/17/2021   Fluad Trivalent(High Dose 65+) 05/13/2023   INFLUENZA, HIGH DOSE SEASONAL PF 06/14/2018, 05/16/2019, 05/17/2024   PFIZER(Purple Top)SARS-COV-2 Vaccination 10/13/2019, 11/03/2019   Pfizer Covid-19 Vaccine Bivalent Booster 35yrs & up 08/28/2021    Health Maintenance  Topic Date Due   Zoster Vaccines- Shingrix (1 of 2) Never done   COVID-19 Vaccine (4 - 2025-26 season) 04/03/2024   FOOT EXAM  05/12/2024   OPHTHALMOLOGY EXAM  06/07/2024   DTaP/Tdap/Td (2 - Tdap) 01/16/2025 (Originally 07/12/2023)   Pneumococcal Vaccine: 50+ Years (1 of 2 - PCV) 01/16/2025 (Originally 02/09/1966)   Diabetic kidney evaluation - Urine ACR  08/25/2024   HEMOGLOBIN A1C  11/15/2024   Diabetic kidney evaluation - eGFR measurement  03/22/2025   Medicare Annual Wellness (AWV)  05/17/2025   Influenza Vaccine  Completed   Bone Density Scan   Completed   Hepatitis C Screening  Completed   Meningococcal B Vaccine  Aged Out   Mammogram  Discontinued   Fecal DNA (Cologuard)  Discontinued     Discussed health benefits of physical activity, and encouraged her to engage in regular exercise appropriate for her age and condition.    Problem List Items Addressed This Visit   None Visit Diagnoses       Right arm pain    -  Primary     Acute pain of right shoulder         Tendonitis of elbow, right         Acute right-sided thoracic back pain         Right arm numbness           Return if symptoms worsen or fail to improve.     Gaither JONELLE Fischer, DO         [1] Outpatient Medications Prior to Visit  Medication Sig   apixaban  (ELIQUIS ) 5 MG TABS tablet Take 1 tablet (5 mg total) by mouth 2 (two) times daily.   dapagliflozin  propanediol (FARXIGA ) 10 MG TABS tablet Take 1 tablet (10 mg total) by mouth daily.   diclofenac  Sodium (VOLTAREN ) 1 % GEL Apply 4 g topically 4 (four) times daily.   furosemide  (LASIX ) 20 MG tablet Take 1 tablet (20 mg total) by mouth daily as needed.   Lancets (ONETOUCH DELICA PLUS LANCET33G) MISC Use as directed to check blood sugars once daily E11.65   loratadine  (CLARITIN ) 10 MG tablet TAKE 1 TABLET BY MOUTH EVERY DAY AS NEEDED FOR ALLERGY   methylPREDNISolone  (MEDROL  DOSEPAK) 4 MG TBPK tablet Day 1: 8mg  before breakfast, 4 mg after lunch, 4 mg after supper, and 8 mg at bedtime Day 2: 4 mg before breakfast, 4 mg after lunch, 4 mg  after supper, and 8 mg  at bedtime Day 3:  4 mg  before breakfast, 4 mg  after lunch, 4 mg after supper, and 4 mg  at bedtime Day 4: 4 mg  before breakfast, 4 mg  after lunch, and 4 mg at bedtime Day 5: 4 mg  before breakfast and 4 mg at bedtime Day 6: 4 mg  before breakfast   metoprolol  succinate (TOPROL -XL) 100 MG 24 hr tablet Take 1 tablet (100 mg total) by mouth daily.   ONETOUCH VERIO test strip Use as directed to check blood sugars once daily E11.65  potassium chloride  SA (KLOR-CON  M) 20 MEQ tablet Take 1 tablet (20 mEq total) by mouth daily as needed. ONLY TAKE POTASSIUM (1) TABLET WHEN YOU TAKE LASIX  (FUROSEMIDE )   rosuvastatin  (CRESTOR ) 40 MG tablet TAKE 1 TABLET DAILY   sacubitril -valsartan  (ENTRESTO ) 24-26 MG Take 1 tablet by mouth 2 (two) times daily. PLEASE REFER TO GENERAL CARDIOLOGY FOR FUTURE REFILLS   spironolactone  (ALDACTONE ) 25 MG tablet Take 0.5 tablets (12.5 mg total) by mouth daily.   No facility-administered medications prior to visit.  [2] Allergies Allergen Reactions   Janumet [Sitagliptin Phos-Metformin Hcl] Shortness Of Breath   Januvia [Sitagliptin] Shortness Of Breath   Kombiglyze [Saxagliptin-Metformin Er] Shortness Of Breath   Sulfa Antibiotics Shortness Of Breath   Elemental Sulfur Itching  "

## 2024-08-22 NOTE — Progress Notes (Signed)
 I,Jameka J Llittleton, CMA,acting as a neurosurgeon for Gaither JONELLE Fischer, DO.,have documented all relevant documentation on the behalf of Gaither JONELLE Fischer, DO,as directed by  Gaither JONELLE Fischer, DO while in the presence of Gaither JONELLE Fischer, DO.  Subjective:  Patient ID: Renee Bradley , female    DOB: Mar 18, 1946 , 79 y.o.   MRN: [id number]  Chief Complaint  Patient presents with   Arm Pain    Patient presents today for a urgent care follow up. She reports she went to the urgent care on 05/17/25 for right arm pain. She was told to take tylenol  and given a prescription of methylprednisolone . She reports she is not taking the tylenol . She reports the pain is unchanged and it comes and goes. She reports the pain is worse when she is using her arm.     HPI  HPI   Past Medical History:  Diagnosis Date   Asthma    Chest pressure 07/13/2012   Diabetes mellitus without complication (HCC)    DM (diabetes mellitus) (HCC) 07/13/2012   HTN (hypertension) 07/13/2012   Hypercholesterolemia    Hypertension    SOB (shortness of breath) 07/13/2012   Tachycardia, unspecified, fluttering 07/13/2012     Family History  Problem Relation Age of Onset   Heart attack Mother    Diabetes type II Mother    Stroke Mother    Alzheimer's disease Father    Diabetes type II Brother     Current Medications[1]   Allergies[2]   Review of Systems   Today's Vitals   08/22/24 1512  BP: 130/70  Pulse: (!) 55  Temp: 98.6 F (37 C)  TempSrc: Oral  Weight: 165 lb (74.8 kg)  Height: 5' 2 (1.575 m)  PainSc: 10-Worst pain ever  PainLoc: Arm   Body mass index is 30.18 kg/m.  Wt Readings from Last 3 Encounters:  08/22/24 165 lb (74.8 kg)  05/17/24 169 lb (76.7 kg)  05/17/24 169 lb 6.4 oz (76.8 kg)    The ASCVD Risk score (Arnett DK, et al., 2019) failed to calculate for the following reasons:   Risk score cannot be calculated because patient has a medical history suggesting prior/existing ASCVD   * - Cholesterol units  were assumed  Objective:  Physical Exam      Assessment And Plan:   Assessment & Plan Right arm pain  Acute pain of right shoulder  Tendonitis of elbow, right  Acute right-sided thoracic back pain   No orders of the defined types were placed in this encounter.    Return if symptoms worsen or fail to improve.  Patient was given opportunity to ask questions. Patient verbalized understanding of the plan and was able to repeat key elements of the plan. All questions were answered to their satisfaction.    LILLETTE Gaither JONELLE Fischer, DO, have reviewed all documentation for this visit. The documentation on 08/22/24 for the exam, diagnosis, procedures, and orders are all accurate and complete.   IF YOU HAVE BEEN REFERRED TO A SPECIALIST, IT MAY TAKE 1-2 WEEKS TO SCHEDULE/PROCESS THE REFERRAL. IF YOU HAVE NOT HEARD FROM US /SPECIALIST IN TWO WEEKS, PLEASE GIVE US  A CALL AT [phone].      [1]  Current Outpatient Medications:    apixaban  (ELIQUIS ) 5 MG TABS tablet, Take 1 tablet (5 mg total) by mouth 2 (two) times daily., Disp: 180 tablet, Rfl: 2   dapagliflozin  propanediol (FARXIGA ) 10 MG TABS tablet, Take 1 tablet (10 mg total) by  mouth daily., Disp: 90 tablet, Rfl: 2   diclofenac  Sodium (VOLTAREN ) 1 % GEL, Apply 4 g topically 4 (four) times daily., Disp: 100 g, Rfl: 0   furosemide  (LASIX ) 20 MG tablet, Take 1 tablet (20 mg total) by mouth daily as needed., Disp: 90 tablet, Rfl: 3   Lancets (ONETOUCH DELICA PLUS LANCET33G) MISC, Use as directed to check blood sugars once daily E11.65, Disp: 300 each, Rfl: 3   loratadine  (CLARITIN ) 10 MG tablet, TAKE 1 TABLET BY MOUTH EVERY DAY AS NEEDED FOR ALLERGY, Disp: 90 tablet, Rfl: 1   methylPREDNISolone  (MEDROL  DOSEPAK) 4 MG TBPK tablet, Day 1: 8mg  before breakfast, 4 mg after lunch, 4 mg after supper, and 8 mg at bedtime Day 2: 4 mg before breakfast, 4 mg after lunch, 4 mg  after supper, and 8 mg  at bedtime Day 3:  4 mg  before breakfast, 4 mg   after lunch, 4 mg after supper, and 4 mg  at bedtime Day 4: 4 mg  before breakfast, 4 mg  after lunch, and 4 mg at bedtime Day 5: 4 mg  before breakfast and 4 mg at bedtime Day 6: 4 mg  before breakfast, Disp: 1 each, Rfl: 0   metoprolol  succinate (TOPROL -XL) 100 MG 24 hr tablet, Take 1 tablet (100 mg total) by mouth daily., Disp: 90 tablet, Rfl: 1   ONETOUCH VERIO test strip, Use as directed to check blood sugars once daily E11.65, Disp: 300 each, Rfl: 2   potassium chloride  SA (KLOR-CON  M) 20 MEQ tablet, Take 1 tablet (20 mEq total) by mouth daily as needed. ONLY TAKE POTASSIUM (1) TABLET WHEN YOU TAKE LASIX  (FUROSEMIDE ), Disp: 90 tablet, Rfl: 3   rosuvastatin  (CRESTOR ) 40 MG tablet, TAKE 1 TABLET DAILY, Disp: 90 tablet, Rfl: 2   sacubitril -valsartan  (ENTRESTO ) 24-26 MG, Take 1 tablet by mouth 2 (two) times daily. PLEASE REFER TO GENERAL CARDIOLOGY FOR FUTURE REFILLS, Disp: 180 tablet, Rfl: 3   spironolactone  (ALDACTONE ) 25 MG tablet, Take 0.5 tablets (12.5 mg total) by mouth daily., Disp: 45 tablet, Rfl: 3 [2]  Allergies Allergen Reactions   Janumet [Sitagliptin Phos-Metformin Hcl] Shortness Of Breath   Januvia [Sitagliptin] Shortness Of Breath   Kombiglyze [Saxagliptin-Metformin Er] Shortness Of Breath   Sulfa Antibiotics Shortness Of Breath   Elemental Sulfur Itching

## 2024-08-22 NOTE — Progress Notes (Deleted)
" ° °  Acute Office Visit  Subjective:     Patient ID: Renee Bradley, female    DOB: [birth year], 79 y.o.   MRN: [id number]  Chief Complaint  Patient presents with   Arm Pain    Patient presents today for a urgent care follow up. She reports she went to the urgent care on 05/17/25 for right arm pain. She was told to take tylenol  and given a prescription of methylprednisolone . She reports she is not taking the tylenol . She reports the pain is unchanged and it comes and goes. She reports the pain is worse when she is using her arm.      This is a 79 year old white female who is here today because she has been having pain over the right upper extremity since 08/17/2024.  She denies any new activities last week but has been lifting up bottles of water.  She states the pain is a intermittent throbbing type pain.  She does have some numbness over the right fingertips involving the 3rd/4th fingers.  She did go to urgent care on 08/19/2023 and was given an injection of Toradol  which did not help and was started on methylprednisolone  which is not helping.  She was also prescribed topical diclofenac  which she has not tried yet secondary to the potential side effects.  She denies any recent fall.  She does sleep on her sides.  She has tried over-the-counter ice and not heat.       Review of Systems  Musculoskeletal:  Positive for joint pain, myalgias and neck pain. Negative for falls.  Neurological:        Numbness over the right distal 3rd/4th fingers        Objective:    Vitals:   08/22/24 1512  BP: 130/70  Pulse: (!) 55  Temp: 98.6 F (37 C)  Height: 5' 2 (1.575 m)  Weight: 165 lb (74.8 kg)  TempSrc: Oral  BMI (Calculated): 30.17      Physical Exam Vitals reviewed.  Constitutional:      Appearance: Normal appearance.  Musculoskeletal:     Right shoulder: Tenderness present.     Right elbow: No swelling. Tenderness present.     Comments: Tenderness with palpation over the right  AC joint.  Passive range of motion of the right shoulder: Increased tenderness over the right AC joint with internal rotation.  Intact flexion/extension, and external rotation. Tenderness with palpation over the right lateral epicondyle.  Passive range of motion of the of the right elbow: Intact with flexion/extension pronation/supination.  There is no edema or erythema over the right elbow with inspection and palpation.  Neurological:     Mental Status: She is alert.     No results found for any visits on 08/22/24.      Assessment & Plan:   Assessment & Plan Right arm pain     Acute pain of right shoulder I advised this patient on 08/22/2024 to apply moist to the right anterior shoulder and over the right lateral elbow followed by applying the topical diclofenac .  She voiced understanding.    Tendonitis of elbow, right     Acute right-sided thoracic back pain     Right arm numbness I advised this patient on 08/22/2024 to call back in 1 week if this numbness does not resolve.  She voiced understanding.     Return if symptoms worsen or fail to improve.  Gaither JONELLE Fischer, DO   "

## 2024-09-28 ENCOUNTER — Ambulatory Visit: Payer: Self-pay | Admitting: Internal Medicine

## 2025-05-21 ENCOUNTER — Ambulatory Visit: Payer: Self-pay | Admitting: Internal Medicine
